# Patient Record
Sex: Female | Born: 1980 | Race: White | Hispanic: No | Marital: Married | Smoking: Never smoker
Health system: Southern US, Community
[De-identification: ages and names within clinical notes are randomized; demographics above are authoritative.]

## PROBLEM LIST (undated history)

## (undated) DIAGNOSIS — D649 Anemia, unspecified: Secondary | ICD-10-CM

## (undated) DIAGNOSIS — Z87440 Personal history of urinary (tract) infections: Secondary | ICD-10-CM

## (undated) DIAGNOSIS — K219 Gastro-esophageal reflux disease without esophagitis: Secondary | ICD-10-CM

## (undated) DIAGNOSIS — F419 Anxiety disorder, unspecified: Secondary | ICD-10-CM

## (undated) DIAGNOSIS — J45909 Unspecified asthma, uncomplicated: Secondary | ICD-10-CM

## (undated) DIAGNOSIS — Z8619 Personal history of other infectious and parasitic diseases: Secondary | ICD-10-CM

## (undated) DIAGNOSIS — T7840XA Allergy, unspecified, initial encounter: Secondary | ICD-10-CM

## (undated) HISTORY — DX: Personal history of urinary (tract) infections: Z87.440

## (undated) HISTORY — DX: Allergy, unspecified, initial encounter: T78.40XA

## (undated) HISTORY — PX: WISDOM TOOTH EXTRACTION: SHX21

## (undated) HISTORY — DX: Personal history of other infectious and parasitic diseases: Z86.19

## (undated) HISTORY — DX: Anxiety disorder, unspecified: F41.9

## (undated) HISTORY — PX: COSMETIC SURGERY: SHX468

---

## 2009-07-15 ENCOUNTER — Inpatient Hospital Stay: Payer: Self-pay

## 2014-06-26 ENCOUNTER — Ambulatory Visit (INDEPENDENT_AMBULATORY_CARE_PROVIDER_SITE_OTHER)
Admission: RE | Admit: 2014-06-26 | Discharge: 2014-06-26 | Disposition: A | Discharge status: Home / Self Care | Payer: BC Managed Care – PPO | Source: Ambulatory Visit | Attending: Internal Medicine | Admitting: Internal Medicine

## 2014-06-26 ENCOUNTER — Ambulatory Visit (INDEPENDENT_AMBULATORY_CARE_PROVIDER_SITE_OTHER): Payer: BC Managed Care – PPO | Admitting: Internal Medicine

## 2014-06-26 ENCOUNTER — Encounter: Payer: Self-pay | Admitting: Internal Medicine

## 2014-06-26 ENCOUNTER — Telehealth: Payer: Self-pay | Admitting: Internal Medicine

## 2014-06-26 VITALS — BP 110/80 | HR 89 | Temp 98.7°F | Ht 64.0 in | Wt 175.8 lb

## 2014-06-26 DIAGNOSIS — Z733 Stress, not elsewhere classified: Secondary | ICD-10-CM

## 2014-06-26 DIAGNOSIS — M7989 Other specified soft tissue disorders: Secondary | ICD-10-CM

## 2014-06-26 DIAGNOSIS — N939 Abnormal uterine and vaginal bleeding, unspecified: Secondary | ICD-10-CM

## 2014-06-26 DIAGNOSIS — Z13220 Encounter for screening for lipoid disorders: Secondary | ICD-10-CM

## 2014-06-26 DIAGNOSIS — M7918 Myalgia, other site: Secondary | ICD-10-CM

## 2014-06-26 DIAGNOSIS — F439 Reaction to severe stress, unspecified: Secondary | ICD-10-CM

## 2014-06-26 DIAGNOSIS — N926 Irregular menstruation, unspecified: Secondary | ICD-10-CM

## 2014-06-26 DIAGNOSIS — M799 Soft tissue disorder, unspecified: Secondary | ICD-10-CM

## 2014-06-26 DIAGNOSIS — Z309 Encounter for contraceptive management, unspecified: Secondary | ICD-10-CM

## 2014-06-26 DIAGNOSIS — M25539 Pain in unspecified wrist: Secondary | ICD-10-CM

## 2014-06-26 DIAGNOSIS — M25532 Pain in left wrist: Secondary | ICD-10-CM

## 2014-06-26 DIAGNOSIS — Z789 Other specified health status: Secondary | ICD-10-CM

## 2014-06-26 DIAGNOSIS — IMO0001 Reserved for inherently not codable concepts without codable children: Secondary | ICD-10-CM

## 2014-06-26 LAB — POCT URINE PREGNANCY: Preg Test, Ur: NEGATIVE

## 2014-06-26 MED ORDER — SERTRALINE HCL 50 MG PO TABS: 50.0000 mg | ORAL_TABLET | Freq: Every day | ORAL | Status: DC

## 2014-06-26 NOTE — Progress Notes (Signed)
Pre visit review using our clinic review tool, if applicable. No additional management support is needed unless otherwise documented below in the visit note. 

## 2014-06-26 NOTE — Telephone Encounter (Signed)
[facility] needs the location of where pt had US done. Please call [place] [phone]

## 2014-06-27 ENCOUNTER — Encounter: Payer: Self-pay | Admitting: *Deleted

## 2014-06-27 ENCOUNTER — Encounter: Payer: Self-pay | Admitting: Internal Medicine

## 2014-06-27 DIAGNOSIS — M7918 Myalgia, other site: Secondary | ICD-10-CM | POA: Insufficient documentation

## 2014-06-27 DIAGNOSIS — F439 Reaction to severe stress, unspecified: Secondary | ICD-10-CM | POA: Insufficient documentation

## 2014-06-27 DIAGNOSIS — Z789 Other specified health status: Secondary | ICD-10-CM | POA: Insufficient documentation

## 2014-06-27 NOTE — Progress Notes (Signed)
   Subjective:    Patient ID: Tara Davila, female    DOB: [birth year], 33 y.o.   MRN: [id number]  HPI 33 year old female who presents to establish care here at [facility].  Former pt of mine at [facility].  States she has noticed a "bump" on her buttock.  States has been present for two years.  Evaluated previously at [facility].  Ultrasound ordered.  States was negative.  Told she had adhesions.  Was referred to a chiropractor.  Did not help. Went to a few sessions.  Reports increased discomfort with sitting.  Her job consists mostly of sitting.  She has recently changed to a standing work station - secondary to the increased pain with sitting.  No pain with walking.  Overdue a physical.  She does report increased stress.  Has worsened recently.  Work stress.  Situation is not going to change anytime soon.  Feels she needs something more to help level things off.  No significant depression.  Eating and drinking well.  She also reports left wrist pain.  Is s/p a fall and has noticed persistent pain - left wrist and forearm.  Hurts more when she rotates her forearm medially.  Still with some swelling, but better.  She has a Mirena IUD.  Is due to be changed in 11/15.  Had placed at [facility].     Past Medical History  Diagnosis Date  . History of chicken pox   . Allergy   . Hx: UTI (urinary tract infection)     Review of Systems Patient denies any headache, lightheadedness or dizziness.  No sinus or allergy symptoms.  No chest pain, tightness or palpitations.  No increased shortness of breath, cough or congestion.  No nausea or vomiting.  No acid reflux.  No abdominal pain or cramping.  No bowel change, such as diarrhea, constipation, BRBPR or melana.  No urine change.  Does describe the "lump" on her buttock.  Persistent.  Increased pain with sitting.  W/up previously as outlined.  Increased stress as outlined.  Feels needs something to help level things out.  Persistent left wrist pain  s/p fall.  Had mirena IUD.       Objective:   Physical Exam Filed Vitals:   06/26/14 1334  BP: 110/80  Pulse: 89  Temp: 98.7 F (22.59 C)   33 year old female in no acute distress.   HEENT:  Nares- clear.  Oropharynx - without lesions. NECK:  Supple.  Nontender.  No audible bruit.  HEART:  Appears to be regular. LUNGS:  No crackles or wheezing audible.  Respirations even and unlabored.  RADIAL PULSE:  Equal bilaterally.   ABDOMEN:  Soft, nontender.  Bowel sounds present and normal.  No audible abdominal bruit.   EXTREMITIES:  No increased edema present.  DP pulses palpable and equal bilaterally.    MSK:  increased pain to palpation over the left wrist and forearm.  Minimal increased soft tissue swelling.  No increased erythema.    Increased palpable mass - right buttock.  Minimal increased tenderness with deep palpation.  No increased erythema.      Assessment & Plan:  HEALTH MAINTENANCE.  Schedule her for a physical.  Obtain records from [facility].    I spent more than 40 minutes with the patient and more than 50% of the time was spent in consultation regarding the above.

## 2014-06-27 NOTE — Assessment & Plan Note (Signed)
Increased stress as outlined.  Discussed at length with her today.  Will start zoloft as directed.  Discussed counseling.  She will notify me if agreeable.  Follow.  Get her back in soon to reassess.

## 2014-06-27 NOTE — Assessment & Plan Note (Signed)
S/p fall.  Persistent pain and swelling.  Check xray.  Wrist splint.  Further w/up and treatment pending.

## 2014-06-27 NOTE — Assessment & Plan Note (Signed)
Has mirena IUD.  Placed at [facility].  Due change 11/15.

## 2014-06-27 NOTE — Assessment & Plan Note (Signed)
Increased soft tissue mass - right buttock.  Exam as outlined.  Increased pain with sitting.  Previous ultrasound negative.  Was told had adhesions.  Pain affecting her work as outlined.  Will refer to surgery for further evaluation.

## 2014-07-02 NOTE — Telephone Encounter (Signed)
Unread mychart message mailed to patient 

## 2014-07-10 ENCOUNTER — Ambulatory Visit: Payer: Self-pay | Admitting: General Surgery

## 2014-07-26 ENCOUNTER — Encounter: Payer: Self-pay | Admitting: General Surgery

## 2014-07-26 ENCOUNTER — Ambulatory Visit (INDEPENDENT_AMBULATORY_CARE_PROVIDER_SITE_OTHER): Payer: BC Managed Care – PPO | Admitting: General Surgery

## 2014-07-26 VITALS — BP 118/70 | HR 76 | Resp 12 | Ht 64.0 in | Wt 175.0 lb

## 2014-07-26 DIAGNOSIS — M7989 Other specified soft tissue disorders: Secondary | ICD-10-CM

## 2014-07-26 DIAGNOSIS — M799 Soft tissue disorder, unspecified: Secondary | ICD-10-CM

## 2014-07-26 NOTE — Patient Instructions (Signed)
The patient is aware to call back for any questions or concerns.  

## 2014-07-26 NOTE — Progress Notes (Signed)
Patient ID: Tara Davila, female   DOB: 1981-10-12, 33 y.o.   MRN: [id number]  Chief Complaint  Patient presents with  . Mass    right buttock    HPI Tara Davila is a 33 y.o. female.  here today for evaluation of a soft tissue mass right upper portion of the buttocks. States it has been there for about 2 years. She does not feel it is getting any larger. States it is "sore" when she sits for along time. She has seen someone in [place] who told her it was an "adhesion". She did go to a chiropractor "active relief therapy" and that did not help.  Denies any injury to that area.  An ultrasound was done 09-09-12.   HPI  Past Medical History  Diagnosis Date  . History of chicken pox   . Allergy   . Hx: UTI (urinary tract infection)     Past Surgical History  Procedure Laterality Date  . Wisdom tooth extraction      Family History  Problem Relation Age of Onset  . Hyperlipidemia Mother   . Hypertension Father   . Cancer Maternal Grandmother     breast  . Hyperlipidemia Maternal Grandfather   . Heart disease Maternal Grandfather   . Hypertension Maternal Grandfather   . Stroke Paternal Grandmother     Social History History  Substance Use Topics  . Smoking status: Never Smoker   . Smokeless tobacco: Never Used  . Alcohol Use: Yes    No Known Allergies  Current Outpatient Prescriptions  Medication Sig Dispense Refill  . sertraline (ZOLOFT) 50 MG tablet Take 1 tablet (50 mg total) by mouth daily.  30 tablet  2   No current facility-administered medications for this visit.    Review of Systems Review of Systems  Constitutional: Negative.   Respiratory: Negative.   Cardiovascular: Negative.     Blood pressure 118/70, pulse 76, resp. rate 12, height 5\' 4"  (1.626 m), weight 175 lb (79.379 kg).  Physical Exam Physical Exam  Constitutional: She is oriented to person, place, and time. She appears well-developed and well-nourished.  Neck: Neck supple.   Cardiovascular: Normal rate, regular rhythm and normal heart sounds.   Pulmonary/Chest: Effort normal and breath sounds normal.  Musculoskeletal:       Legs: Lymphadenopathy:    She has no cervical adenopathy.  Neurological: She is alert and oriented to person, place, and time.  Skin: Skin is warm and dry.  2 x 4 cm firm thickening right gluteal area    Data Reviewed Ultrasound of the buttocks completed September 09, 2012 at [facility] showed no discernible abnormality.  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     Plan    Options were reviewed: 1) observation versus 2) excision. The patient is not appreciated a significant change in at least the last 12 months. It's nontender except for firm direct pressure, which rarely occurs. My recommendation would be for observation. If she desires excision this can be completed under local anesthesia, but will leave a scar which may be tender. She will notify the office if the area changes, follow up otherwise will be on an as needed basis.      PCP/Ref: Nash Shearer 07/27/2014, 7:20 PM

## 2014-07-27 DIAGNOSIS — M7989 Other specified soft tissue disorders: Secondary | ICD-10-CM | POA: Insufficient documentation

## 2014-09-24 ENCOUNTER — Encounter: Payer: Self-pay | Admitting: General Surgery

## 2015-01-30 ENCOUNTER — Other Ambulatory Visit: Payer: Self-pay | Admitting: Internal Medicine

## 2015-03-01 ENCOUNTER — Other Ambulatory Visit: Payer: Self-pay | Admitting: Internal Medicine

## 2015-03-28 ENCOUNTER — Ambulatory Visit (INDEPENDENT_AMBULATORY_CARE_PROVIDER_SITE_OTHER): Payer: BC Managed Care – PPO | Admitting: Internal Medicine

## 2015-03-28 ENCOUNTER — Encounter: Payer: Self-pay | Admitting: Internal Medicine

## 2015-03-28 VITALS — BP 100/70 | HR 84 | Temp 98.4°F | Ht 64.0 in | Wt 180.0 lb

## 2015-03-28 DIAGNOSIS — Z309 Encounter for contraceptive management, unspecified: Secondary | ICD-10-CM

## 2015-03-28 DIAGNOSIS — Z789 Other specified health status: Secondary | ICD-10-CM

## 2015-03-28 DIAGNOSIS — M799 Soft tissue disorder, unspecified: Secondary | ICD-10-CM

## 2015-03-28 DIAGNOSIS — Z0000 Encounter for general adult medical examination without abnormal findings: Secondary | ICD-10-CM | POA: Diagnosis not present

## 2015-03-28 DIAGNOSIS — M7989 Other specified soft tissue disorders: Secondary | ICD-10-CM

## 2015-03-28 DIAGNOSIS — Z658 Other specified problems related to psychosocial circumstances: Secondary | ICD-10-CM

## 2015-03-28 DIAGNOSIS — F439 Reaction to severe stress, unspecified: Secondary | ICD-10-CM

## 2015-03-28 MED ORDER — SERTRALINE HCL 50 MG PO TABS: 50.0000 mg | ORAL_TABLET | Freq: Every day | ORAL | Status: DC

## 2015-03-28 NOTE — Progress Notes (Signed)
Patient ID: Tara Davila, female   DOB: 03-08-1981, 34 y.o.   MRN: [id number]   Subjective:    Patient ID: Tara Davila, female    DOB: 1981-11-16, 34 y.o.   MRN: [id number]  HPI  Patient here for a scheduled physical, but has had her breast, pelvic and pap smear from gyn.  Here to f/u on her current medical issues.  She saw Dr Bary Castilla.  Diagnosed with gluteal lipoma.  Observation.  Off zoloft for one month.  Increased stress and anxiety.  Increased stress at work.  Feels like she is going to explode at times.  Feels she needs to be back on zoloft.     Past Medical History  Diagnosis Date  . History of chicken pox   . Allergy   . Hx: UTI (urinary tract infection)     Outpatient Encounter Prescriptions as of 03/28/2015  Medication Sig  . [DISCONTINUED] sertraline (ZOLOFT) 50 MG tablet TAKE 1 TABLET (50 MG TOTAL) BY MOUTH DAILY.  Marland Kitchen sertraline (ZOLOFT) 50 MG tablet Take 1 tablet (50 mg total) by mouth daily.   No facility-administered encounter medications on file as of 03/28/2015.    Review of Systems  Constitutional: Positive for appetite change (feels - stays hungry. ). Negative for unexpected weight change.  HENT: Negative for congestion and sinus pressure.   Eyes: Negative for pain and visual disturbance.  Respiratory: Negative for cough, chest tightness and shortness of breath.   Cardiovascular: Negative for palpitations and leg swelling.  Gastrointestinal: Negative for nausea, vomiting, abdominal pain and diarrhea.  Genitourinary: Negative for dysuria and difficulty urinating.  Musculoskeletal: Negative for back pain and joint swelling.  Skin: Negative for color change and rash.  Neurological: Negative for dizziness, light-headedness and headaches.  Hematological: Negative for adenopathy. Does not bruise/bleed easily.  Psychiatric/Behavioral: Negative for dysphoric mood and agitation.       Objective:     Pulse 76  Physical Exam  Constitutional: She appears  well-developed and well-nourished. No distress.  HENT:  Nose: Nose normal.  Mouth/Throat: Oropharynx is clear and moist.  Neck: Neck supple. No thyromegaly present.  Cardiovascular: Normal rate and regular rhythm.   Pulmonary/Chest: Breath sounds normal. No respiratory distress. She has no wheezes.  Abdominal: Soft. Bowel sounds are normal. There is no tenderness.  Musculoskeletal: She exhibits no edema or tenderness.  Lymphadenopathy:    She has no cervical adenopathy.  Skin: No rash noted. No erythema.  Psychiatric: Her behavior is normal.    BP 100/70 mmHg  Pulse 84  Temp(Src) 98.4 F (36.9 C) (Oral)  Ht 5\' 4"  (1.626 m)  Wt 180 lb (81.647 kg)  BMI 30.88 kg/m2  SpO2 97% Wt Readings from Last 3 Encounters:  03/28/15 180 lb (81.647 kg)  07/26/14 175 lb (79.379 kg)  06/26/14 175 lb 12 oz (79.72 kg)       Assessment & Plan:   Problem List Items Addressed This Visit    Health care maintenance    GYN does her breast, pelvic and pap smears.  Obtain records.  States up to date.       Soft tissue mass - Primary    Right gluteal lipoma.  Saw Dr Bary Castilla.  Observation.       Stress    Symptoms as outlined.  Restart zoloft 50mg  q day.  She did feel it helped when she took the medication.  Restart.  Get her back in soon to reassess.  Uses birth control    Has IUD in place.          I spent 25 minutes with the patient and more than 50% of the time was spent in consultation regarding the above.     Einar Pheasant, MD

## 2015-03-28 NOTE — Progress Notes (Signed)
Pre visit review using our clinic review tool, if applicable. No additional management support is needed unless otherwise documented below in the visit note. 

## 2015-03-31 ENCOUNTER — Encounter: Payer: Self-pay | Admitting: Internal Medicine

## 2015-03-31 DIAGNOSIS — Z0000 Encounter for general adult medical examination without abnormal findings: Secondary | ICD-10-CM | POA: Insufficient documentation

## 2015-03-31 NOTE — Assessment & Plan Note (Signed)
Has IUD in place.

## 2015-03-31 NOTE — Assessment & Plan Note (Signed)
Right gluteal lipoma.  Saw Dr Bary Castilla.  Observation.

## 2015-03-31 NOTE — Assessment & Plan Note (Signed)
GYN does her breast, pelvic and pap smears.  Obtain records.  States up to date.

## 2015-03-31 NOTE — Assessment & Plan Note (Signed)
Symptoms as outlined.  Restart zoloft 50mg  q day.  She did feel it helped when she took the medication.  Restart.  Get her back in soon to reassess.

## 2015-06-27 ENCOUNTER — Encounter: Payer: Self-pay | Admitting: Internal Medicine

## 2015-06-27 ENCOUNTER — Ambulatory Visit (INDEPENDENT_AMBULATORY_CARE_PROVIDER_SITE_OTHER): Payer: BC Managed Care – PPO | Admitting: Internal Medicine

## 2015-06-27 VITALS — BP 100/70 | HR 81 | Temp 99.0°F | Ht 64.0 in | Wt 161.4 lb

## 2015-06-27 DIAGNOSIS — Z658 Other specified problems related to psychosocial circumstances: Secondary | ICD-10-CM | POA: Diagnosis not present

## 2015-06-27 DIAGNOSIS — Z0000 Encounter for general adult medical examination without abnormal findings: Secondary | ICD-10-CM

## 2015-06-27 DIAGNOSIS — F439 Reaction to severe stress, unspecified: Secondary | ICD-10-CM

## 2015-06-27 MED ORDER — SERTRALINE HCL 50 MG PO TABS: 50.0000 mg | ORAL_TABLET | Freq: Every day | ORAL | Status: DC

## 2015-06-27 NOTE — Assessment & Plan Note (Signed)
Doing well on zoloft.  Doing better.  Continue current dose.

## 2015-06-27 NOTE — Progress Notes (Signed)
Patient ID: Tara Davila, female   DOB: [birth year], 34 y.o.   MRN: [id number]   Subjective:    Patient ID: Tara Davila, female    DOB: 1981-05-01, 34 y.o.   MRN: [id number]  HPI  Patient here for a scheduled follow up.  Has adjusted her diet.  Lost weight.  She is maintaining her weight loss.  Is exercising.  Plans to exercise more.  No cardiac symptoms with increased activity or exertion.  No sob.  Feels better.  zoloft working well.     Past Medical History  Diagnosis Date  . History of chicken pox   . Allergy   . Hx: UTI (urinary tract infection)     Outpatient Encounter Prescriptions as of 06/27/2015  Medication Sig  . sertraline (ZOLOFT) 50 MG tablet Take 1 tablet (50 mg total) by mouth daily.  . [DISCONTINUED] sertraline (ZOLOFT) 50 MG tablet Take 1 tablet (50 mg total) by mouth daily.   No facility-administered encounter medications on file as of 06/27/2015.    Review of Systems  Constitutional: Negative for appetite change and unexpected weight change.  HENT: Negative for congestion and sinus pressure.   Respiratory: Negative for cough, chest tightness and shortness of breath.   Cardiovascular: Negative for chest pain, palpitations and leg swelling.  Gastrointestinal: Negative for nausea, vomiting, abdominal pain and diarrhea.  Neurological: Negative for dizziness, light-headedness and headaches.  Psychiatric/Behavioral: Negative for dysphoric mood and agitation.       Objective:   blood pressure recheck:  104/68  Physical Exam  Constitutional: She appears well-developed and well-nourished. No distress.  HENT:  Nose: Nose normal.  Mouth/Throat: Oropharynx is clear and moist.  Neck: Neck supple. No thyromegaly present.  Cardiovascular: Normal rate and regular rhythm.   Pulmonary/Chest: Breath sounds normal. No respiratory distress. She has no wheezes.  Abdominal: Soft. Bowel sounds are normal. There is no tenderness.  Musculoskeletal: She exhibits no  edema or tenderness.  Lymphadenopathy:    She has no cervical adenopathy.  Skin: No rash noted. No erythema.  Psychiatric: She has a normal mood and affect. Her behavior is normal.    BP 100/70 mmHg  Pulse 81  Temp(Src) 99 F (37.2 C) (Oral)  Ht 5\' 4"  (1.626 m)  Wt 161 lb 6 oz (73.199 kg)  BMI 27.69 kg/m2  SpO2 99% Wt Readings from Last 3 Encounters:  06/27/15 161 lb 6 oz (73.199 kg)  03/28/15 180 lb (81.647 kg)  07/26/14 175 lb (79.379 kg)        Assessment & Plan:   Problem List Items Addressed This Visit    Health care maintenance - Primary    GYN does her breast, pelvic and pap smears.  Up to date.       Stress    Doing well on zoloft.  Doing better.  Continue current dose.          Desire for weight loss.  Has done well.  Continue diet and exercise.    Tara Pheasant, MD

## 2015-06-27 NOTE — Assessment & Plan Note (Signed)
GYN does her breast, pelvic and pap smears.  Up to date.

## 2015-06-27 NOTE — Progress Notes (Signed)
Pre visit review using our clinic review tool, if applicable. No additional management support is needed unless otherwise documented below in the visit note. 

## 2015-09-27 ENCOUNTER — Ambulatory Visit (INDEPENDENT_AMBULATORY_CARE_PROVIDER_SITE_OTHER): Payer: BC Managed Care – PPO | Admitting: Internal Medicine

## 2015-09-27 ENCOUNTER — Encounter: Payer: Self-pay | Admitting: Internal Medicine

## 2015-09-27 VITALS — BP 100/80 | HR 74 | Temp 98.6°F | Resp 18 | Ht 64.0 in | Wt 160.0 lb

## 2015-09-27 DIAGNOSIS — Z658 Other specified problems related to psychosocial circumstances: Secondary | ICD-10-CM | POA: Diagnosis not present

## 2015-09-27 DIAGNOSIS — Z789 Other specified health status: Secondary | ICD-10-CM

## 2015-09-27 DIAGNOSIS — Z23 Encounter for immunization: Secondary | ICD-10-CM

## 2015-09-27 DIAGNOSIS — F439 Reaction to severe stress, unspecified: Secondary | ICD-10-CM

## 2015-09-27 DIAGNOSIS — Z13220 Encounter for screening for lipoid disorders: Secondary | ICD-10-CM | POA: Diagnosis not present

## 2015-09-27 DIAGNOSIS — Z309 Encounter for contraceptive management, unspecified: Secondary | ICD-10-CM | POA: Diagnosis not present

## 2015-09-27 NOTE — Progress Notes (Signed)
Pre-visit discussion using our clinic review tool. No additional management support is needed unless otherwise documented below in the visit note.  

## 2015-09-27 NOTE — Progress Notes (Signed)
Patient ID: Tara Davila, female   DOB: Dec 23, 1980, 34 y.o.   MRN: [id number]   Subjective:    Patient ID: Tara Davila, female    DOB: 1981-11-15, 34 y.o.   MRN: [id number]  HPI  Patient with past history of allergies and increased stress. She comes in today for a scheduled follow up of these issues.  On zoloft.  Doing well.  Feels better.  Has been able to maintain her weight.  Is walking.  Has adjusted her diet.  No cardiac symptoms with increased activity or exertion.  No sob.  No abdominal pain or cramping.  Bowels stable.     Past Medical History  Diagnosis Date  . History of chicken pox   . Allergy   . Hx: UTI (urinary tract infection)    Past Surgical History  Procedure Laterality Date  . Wisdom tooth extraction     Family History  Problem Relation Age of Onset  . Hyperlipidemia Mother   . Hypertension Father   . Cancer Maternal Grandmother     breast  . Hyperlipidemia Maternal Grandfather   . Heart disease Maternal Grandfather   . Hypertension Maternal Grandfather   . Stroke Paternal Grandmother    Social History   Social History  . Marital Status: Married    Spouse Name: N/A  . Number of Children: N/A  . Years of Education: N/A   Social History Main Topics  . Smoking status: Never Smoker   . Smokeless tobacco: Never Used  . Alcohol Use: 0.0 oz/week    0 Standard drinks or equivalent per week  . Drug Use: No  . Sexual Activity: Not Asked   Other Topics Concern  . None   Social History Narrative    Outpatient Encounter Prescriptions as of 09/27/2015  Medication Sig  . sertraline (ZOLOFT) 50 MG tablet Take 1 tablet (50 mg total) by mouth daily.   No facility-administered encounter medications on file as of 09/27/2015.    Review of Systems  Constitutional: Negative for appetite change and unexpected weight change.  HENT: Negative for congestion and sinus pressure.   Respiratory: Negative for cough, chest tightness and shortness of breath.    Cardiovascular: Negative for chest pain, palpitations and leg swelling.  Gastrointestinal: Negative for nausea, vomiting, abdominal pain and diarrhea.  Genitourinary: Negative for dysuria and difficulty urinating.  Musculoskeletal: Negative for back pain and joint swelling.  Skin: Negative for color change and rash.  Neurological: Negative for dizziness, light-headedness and headaches.  Psychiatric/Behavioral: Negative for dysphoric mood and agitation.       Objective:    Physical Exam  Constitutional: She appears well-developed and well-nourished. No distress.  HENT:  Nose: Nose normal.  Mouth/Throat: Oropharynx is clear and moist.  Eyes: Conjunctivae are normal. Right eye exhibits no discharge. Left eye exhibits no discharge.  Neck: Neck supple. No thyromegaly present.  Cardiovascular: Normal rate and regular rhythm.   Pulmonary/Chest: Breath sounds normal. No respiratory distress. She has no wheezes.  Abdominal: Soft. Bowel sounds are normal. There is no tenderness.  Musculoskeletal: She exhibits no edema or tenderness.  Lymphadenopathy:    She has no cervical adenopathy.  Skin: No rash noted. No erythema.  Psychiatric: She has a normal mood and affect. Her behavior is normal.    BP 100/80 mmHg  Pulse 74  Temp(Src) 98.6 F (37 C) (Oral)  Resp 18  Ht 5\' 4"  (1.626 m)  Wt 160 lb (72.576 kg)  BMI 27.45 kg/m2  SpO2 96%  Wt Readings from Last 3 Encounters:  09/27/15 160 lb (72.576 kg)  06/27/15 161 lb 6 oz (73.199 kg)  03/28/15 180 lb (81.647 kg)        Assessment & Plan:   Problem List Items Addressed This Visit    Stress    Doing better handling stress.  On zoloft and doing well.  Continue.  Follow.        Relevant Orders   CBC with Differential/Platelet   Comprehensive metabolic panel   TSH   Uses birth control    Has IUD in place.  Follow.         Other Visit Diagnoses    Encounter for immunization    -  Primary    Screening cholesterol level         Relevant Orders    Lipid panel        Einar Pheasant, MD

## 2015-09-27 NOTE — Patient Instructions (Signed)

## 2015-09-29 ENCOUNTER — Encounter: Payer: Self-pay | Admitting: Internal Medicine

## 2015-09-29 NOTE — Assessment & Plan Note (Signed)
Has IUD in place.  Follow.

## 2015-09-29 NOTE — Assessment & Plan Note (Signed)
Doing better handling stress.  On zoloft and doing well.  Continue.  Follow.

## 2015-11-06 ENCOUNTER — Other Ambulatory Visit: Payer: BC Managed Care – PPO

## 2015-11-11 ENCOUNTER — Other Ambulatory Visit (INDEPENDENT_AMBULATORY_CARE_PROVIDER_SITE_OTHER): Payer: BC Managed Care – PPO

## 2015-11-11 DIAGNOSIS — Z13220 Encounter for screening for lipoid disorders: Secondary | ICD-10-CM | POA: Diagnosis not present

## 2015-11-11 DIAGNOSIS — F439 Reaction to severe stress, unspecified: Secondary | ICD-10-CM

## 2015-11-11 DIAGNOSIS — Z658 Other specified problems related to psychosocial circumstances: Secondary | ICD-10-CM

## 2015-11-11 LAB — CBC WITH DIFFERENTIAL/PLATELET
Basophils Absolute: 0 10*3/uL (ref 0.0–0.1)
Basophils Relative: 0.4 % (ref 0.0–3.0)
EOS PCT: 1.2 % (ref 0.0–5.0)
Eosinophils Absolute: 0.1 10*3/uL (ref 0.0–0.7)
HCT: 40.3 % (ref 36.0–46.0)
Hemoglobin: 13.4 g/dL (ref 12.0–15.0)
LYMPHS ABS: 2.7 10*3/uL (ref 0.7–4.0)
Lymphocytes Relative: 28.9 % (ref 12.0–46.0)
MCHC: 33.2 g/dL (ref 30.0–36.0)
MCV: 90.3 fl (ref 78.0–100.0)
MONO ABS: 0.6 10*3/uL (ref 0.1–1.0)
Monocytes Relative: 6.6 % (ref 3.0–12.0)
NEUTROS PCT: 62.9 % (ref 43.0–77.0)
Neutro Abs: 5.8 10*3/uL (ref 1.4–7.7)
Platelets: 205 10*3/uL (ref 150.0–400.0)
RBC: 4.46 Mil/uL (ref 3.87–5.11)
RDW: 13.6 % (ref 11.5–15.5)
WBC: 9.3 10*3/uL (ref 4.0–10.5)

## 2015-11-11 LAB — LIPID PANEL
CHOLESTEROL: 127 mg/dL (ref 0–200)
HDL: 46.9 mg/dL (ref >39.00)
LDL CALC: 57 mg/dL (ref 0–99)
NonHDL: 80.55
Total CHOL/HDL Ratio: 3
Triglycerides: 118 mg/dL (ref 0.0–149.0)
VLDL: 23.6 mg/dL (ref 0.0–40.0)

## 2015-11-11 LAB — COMPREHENSIVE METABOLIC PANEL
ALK PHOS: 56 U/L (ref 39–117)
ALT: 15 U/L (ref 0–35)
AST: 15 U/L (ref 0–37)
Albumin: 4.3 g/dL (ref 3.5–5.2)
BUN: 14 mg/dL (ref 6–23)
CO2: 25 meq/L (ref 19–32)
Calcium: 9.4 mg/dL (ref 8.4–10.5)
Chloride: 103 mEq/L (ref 96–112)
Creatinine, Ser: 0.66 mg/dL (ref 0.40–1.20)
GFR: 108.24 mL/min (ref >60.00)
GLUCOSE: 84 mg/dL (ref 70–99)
POTASSIUM: 3.5 meq/L (ref 3.5–5.1)
SODIUM: 136 meq/L (ref 135–145)
TOTAL PROTEIN: 7.3 g/dL (ref 6.0–8.3)
Total Bilirubin: 0.4 mg/dL (ref 0.2–1.2)

## 2015-11-11 LAB — TSH: TSH: 2.27 u[IU]/mL (ref 0.35–4.50)

## 2015-11-12 ENCOUNTER — Encounter: Payer: Self-pay | Admitting: Internal Medicine

## 2016-01-30 ENCOUNTER — Ambulatory Visit: Payer: BC Managed Care – PPO | Admitting: Internal Medicine

## 2016-02-15 ENCOUNTER — Other Ambulatory Visit: Payer: Self-pay | Admitting: Internal Medicine

## 2016-03-21 ENCOUNTER — Other Ambulatory Visit: Payer: Self-pay | Admitting: Internal Medicine

## 2016-05-29 ENCOUNTER — Ambulatory Visit (INDEPENDENT_AMBULATORY_CARE_PROVIDER_SITE_OTHER): Payer: BC Managed Care – PPO | Admitting: Internal Medicine

## 2016-05-29 ENCOUNTER — Encounter: Payer: Self-pay | Admitting: Internal Medicine

## 2016-05-29 VITALS — BP 100/70 | HR 79 | Temp 98.4°F | Resp 18 | Ht 64.0 in | Wt 166.2 lb

## 2016-05-29 DIAGNOSIS — F439 Reaction to severe stress, unspecified: Secondary | ICD-10-CM

## 2016-05-29 DIAGNOSIS — Z658 Other specified problems related to psychosocial circumstances: Secondary | ICD-10-CM | POA: Diagnosis not present

## 2016-05-29 DIAGNOSIS — Z309 Encounter for contraceptive management, unspecified: Secondary | ICD-10-CM | POA: Diagnosis not present

## 2016-05-29 DIAGNOSIS — Z789 Other specified health status: Secondary | ICD-10-CM

## 2016-05-29 DIAGNOSIS — G479 Sleep disorder, unspecified: Secondary | ICD-10-CM

## 2016-05-29 MED ORDER — SERTRALINE HCL 50 MG PO TABS: ORAL_TABLET | ORAL | Status: DC

## 2016-05-29 NOTE — Progress Notes (Signed)
Patient ID: Tara Davila, female   DOB: 1981-09-25, 35 y.o.   MRN: [id number]   Subjective:    Patient ID: Tara Davila, female    DOB: [birth year], 35 y.o.   MRN: [id number]  HPI  Patient here for a scheduled follow up.  States she has been under increased stress recently.  Increased stress at work.  Also increased stress with her sons medical issues.  She reports some increased anxiety.  Does feel zoloft has helped.  Not sleeping well.  Mind not shutting down.  Eating and drinking.  Has gained some of her weight back.  Still exercising.  No cardiac symptoms with increased activity or exertion.  No sob.  No abdominal pain or cramping.  Bowels stable.     Past Medical History  Diagnosis Date  . History of chicken pox   . Allergy   . Hx: UTI (urinary tract infection)    Past Surgical History  Procedure Laterality Date  . Wisdom tooth extraction     Family History  Problem Relation Age of Onset  . Hyperlipidemia Mother   . Hypertension Father   . Cancer Maternal Grandmother     breast  . Hyperlipidemia Maternal Grandfather   . Heart disease Maternal Grandfather   . Hypertension Maternal Grandfather   . Stroke Paternal Grandmother    Social History   Social History  . Marital Status: Married    Spouse Name: N/A  . Number of Children: N/A  . Years of Education: N/A   Social History Main Topics  . Smoking status: Never Smoker   . Smokeless tobacco: Never Used  . Alcohol Use: 0.0 oz/week    0 Standard drinks or equivalent per week  . Drug Use: No  . Sexual Activity: Not Asked   Other Topics Concern  . None   Social History Narrative    Outpatient Encounter Prescriptions as of 05/29/2016  Medication Sig  . sertraline (ZOLOFT) 50 MG tablet Take 1 1/2 tablet q day  . [DISCONTINUED] sertraline (ZOLOFT) 50 MG tablet TAKE 1 TABLET (50 MG TOTAL) BY MOUTH DAILY.   No facility-administered encounter medications on file as of 05/29/2016.    Review of Systems    Constitutional: Negative for appetite change and unexpected weight change.  HENT: Negative for congestion and sinus pressure.   Respiratory: Negative for cough, chest tightness and shortness of breath.   Cardiovascular: Negative for chest pain, palpitations and leg swelling.  Gastrointestinal: Negative for nausea, vomiting, abdominal pain and diarrhea.  Genitourinary: Negative for dysuria and difficulty urinating.  Musculoskeletal: Negative for back pain and joint swelling.  Skin: Negative for color change and rash.  Neurological: Negative for dizziness, light-headedness and headaches.  Psychiatric/Behavioral: Negative for dysphoric mood and agitation.       Objective:    Physical Exam  Constitutional: She appears well-developed and well-nourished. No distress.  HENT:  Nose: Nose normal.  Mouth/Throat: Oropharynx is clear and moist.  Neck: Neck supple. No thyromegaly present.  Cardiovascular: Normal rate and regular rhythm.   Pulmonary/Chest: Breath sounds normal. No respiratory distress. She has no wheezes.  Abdominal: Soft. Bowel sounds are normal. There is no tenderness.  Musculoskeletal: She exhibits no edema or tenderness.  Lymphadenopathy:    She has no cervical adenopathy.  Skin: No rash noted. No erythema.  Psychiatric: She has a normal mood and affect. Her behavior is normal.    BP 100/70 mmHg  Pulse 79  Temp(Src) 98.4 F (36.9 C) (Oral)  Resp  18  Ht 5\' 4"  (1.626 m)  Wt 166 lb 4 oz (75.411 kg)  BMI 28.52 kg/m2  SpO2 98% Wt Readings from Last 3 Encounters:  05/29/16 166 lb 4 oz (75.411 kg)  09/27/15 160 lb (72.576 kg)  06/27/15 161 lb 6 oz (73.199 kg)     Lab Results  Component Value Date   WBC 9.3 11/11/2015   HGB 13.4 11/11/2015   HCT 40.3 11/11/2015   PLT 205.0 11/11/2015   GLUCOSE 84 11/11/2015   CHOL 127 11/11/2015   TRIG 118.0 11/11/2015   HDL 46.90 11/11/2015   LDLCALC 57 11/11/2015   ALT 15 11/11/2015   AST 15 11/11/2015   NA 136  11/11/2015   K 3.5 11/11/2015   CL 103 11/11/2015   CREATININE 0.66 11/11/2015   BUN 14 11/11/2015   CO2 25 11/11/2015   TSH 2.27 11/11/2015        Assessment & Plan:   Problem List Items Addressed This Visit    Difficulty sleeping    Discussed with her today.  Will increase zoloft to 75mg  q day.  See if this helps sleep.  Follow.        Stress - Primary    Discussed with her today.  Discussed counseling.  Discussed her current stress.  Will increased zoloft to 75mg  q day.  See if this helps sleeping.  Follow.  Get her back in soon to reassess.        Uses birth control    Has IUD in place.  Follow.          I spent 25 minutes with the patient and more than 50% of the time was spent in consultation regarding the above.     Einar Pheasant, MD

## 2016-05-29 NOTE — Progress Notes (Signed)
Pre-visit discussion using our clinic review tool. No additional management support is needed unless otherwise documented below in the visit note.  

## 2016-05-31 ENCOUNTER — Encounter: Payer: Self-pay | Admitting: Internal Medicine

## 2016-05-31 DIAGNOSIS — G479 Sleep disorder, unspecified: Secondary | ICD-10-CM | POA: Insufficient documentation

## 2016-05-31 NOTE — Assessment & Plan Note (Signed)
Has IUD in place.  Follow.

## 2016-05-31 NOTE — Assessment & Plan Note (Signed)
Discussed with her today.  Will increase zoloft to 75mg  q day.  See if this helps sleep.  Follow.

## 2016-05-31 NOTE — Assessment & Plan Note (Signed)
Discussed with her today.  Discussed counseling.  Discussed her current stress.  Will increased zoloft to 75mg  q day.  See if this helps sleeping.  Follow.  Get her back in soon to reassess.

## 2016-08-13 ENCOUNTER — Ambulatory Visit: Payer: BC Managed Care – PPO | Admitting: Internal Medicine

## 2017-03-13 ENCOUNTER — Other Ambulatory Visit: Payer: Self-pay | Admitting: Internal Medicine

## 2017-03-15 NOTE — Telephone Encounter (Signed)
Last OV and refill was 06/08/16, Please advise for refill, no upcoming appt.

## 2017-03-15 NOTE — Telephone Encounter (Signed)
Should already be out of medication,so need to know how she has been taking the medication.  Also confirm doing ok.  Will need f/u appt with me.  Just need a little more info to determine refill.  Thanks

## 2017-03-17 NOTE — Telephone Encounter (Signed)
Left a VM to return my call, thanks 

## 2019-08-02 ENCOUNTER — Telehealth: Payer: Self-pay | Admitting: Obstetrics & Gynecology

## 2019-08-02 NOTE — Telephone Encounter (Signed)
Patient scheduled 9/21 for mirena replacement with [facility]

## 2019-08-02 NOTE — Telephone Encounter (Signed)
Noted. Will order to arrive by apt date/time. 

## 2019-08-14 ENCOUNTER — Other Ambulatory Visit (HOSPITAL_COMMUNITY)
Admission: RE | Admit: 2019-08-14 | Discharge: 2019-08-14 | Disposition: A | Discharge status: Home / Self Care | Payer: BC Managed Care – PPO | Source: Ambulatory Visit | Attending: Obstetrics & Gynecology | Admitting: Obstetrics & Gynecology

## 2019-08-14 ENCOUNTER — Ambulatory Visit (INDEPENDENT_AMBULATORY_CARE_PROVIDER_SITE_OTHER): Payer: BC Managed Care – PPO | Admitting: Obstetrics & Gynecology

## 2019-08-14 ENCOUNTER — Encounter: Payer: Self-pay | Admitting: Obstetrics & Gynecology

## 2019-08-14 ENCOUNTER — Other Ambulatory Visit: Payer: Self-pay

## 2019-08-14 VITALS — BP 120/80 | Ht 65.0 in | Wt 167.0 lb

## 2019-08-14 DIAGNOSIS — Z01419 Encounter for gynecological examination (general) (routine) without abnormal findings: Secondary | ICD-10-CM

## 2019-08-14 DIAGNOSIS — Z124 Encounter for screening for malignant neoplasm of cervix: Secondary | ICD-10-CM | POA: Diagnosis not present

## 2019-08-14 DIAGNOSIS — Z30433 Encounter for removal and reinsertion of intrauterine contraceptive device: Secondary | ICD-10-CM

## 2019-08-14 NOTE — Patient Instructions (Signed)
Intrauterine Device Insertion, Care After  This sheet gives you information about how to care for yourself after your procedure. Your health care provider may also give you more specific instructions. If you have problems or questions, contact your health care provider. What can I expect after the procedure? After the procedure, it is common to have:  Cramps and pain in the abdomen.  Light bleeding (spotting) or heavier bleeding that is like your menstrual period. This may last for up to a few days.  Lower back pain.  Dizziness.  Headaches.  Nausea. Follow these instructions at home:  Before resuming sexual activity, check to make sure that you can feel the IUD string(s). You should be able to feel the end of the string(s) below the opening of your cervix. If your IUD string is in place, you may resume sexual activity. ? If you had a hormonal IUD inserted more than 7 days after your most recent period started, you will need to use a backup method of birth control for 7 days after IUD insertion. Ask your health care provider whether this applies to you.  Continue to check that the IUD is still in place by feeling for the string(s) after every menstrual period, or once a month.  Take over-the-counter and prescription medicines only as told by your health care provider.  Do not drive or use heavy machinery while taking prescription pain medicine.  Keep all follow-up visits as told by your health care provider. This is important. Contact a health care provider if:  You have bleeding that is heavier or lasts longer than a normal menstrual cycle.  You have a fever.  You have cramps or abdominal pain that get worse or do not get better with medicine.  You develop abdominal pain that is new or is not in the same area of earlier cramping and pain.  You feel lightheaded or weak.  You have abnormal or bad-smelling discharge from your vagina.  You have pain during sexual activity.   You have any of the following problems with your IUD string(s): ? The string bothers or hurts you or your sexual partner. ? You cannot feel the string. ? The string has gotten longer.  You can feel the IUD in your vagina.  You think you may be pregnant, or you miss your menstrual period.  You think you may have an STI (sexually transmitted infection). Get help right away if:  You have flu-like symptoms.  You have a fever and chills.  You can feel that your IUD has slipped out of place. Summary  After the procedure, it is common to have cramps and pain in the abdomen. It is also common to have light bleeding (spotting) or heavier bleeding that is like your menstrual period.  Continue to check that the IUD is still in place by feeling for the string(s) after every menstrual period, or once a month.  Keep all follow-up visits as told by your health care provider. This is important.  Contact your health care provider if you have problems with your IUD string(s), such as the string getting longer or bothering you or your sexual partner. This information is not intended to replace advice given to you by your health care provider. Make sure you discuss any questions you have with your health care provider. Document Released: 07/08/2011 Document Revised: 10/22/2017 Document Reviewed: 09/30/2016 Elsevier Patient Education  2020 Elsevier Inc.  

## 2019-08-14 NOTE — Progress Notes (Signed)
HPI:      Ms. RICKEYA HEMMERT is a 38 y.o. G2P0010 who LMP was No LMP recorded. Patient has had an implant., she presents today for her annual examination. The patient has no complaints today. The patient is sexually active. Her last pap: was normal. The patient does perform self breast exams.  There is notable family history of breast or ovarian cancer in her family.  The patient has regular exercise: yes.  The patient denies current symptoms of depression.    GYN History: Contraception: IUD, for 10 years (Mirena placed every 5)    Does not desire pregnancy again  PMHx: Past Medical History:  Diagnosis Date  . Allergy   . History of chicken pox   . Hx: UTI (urinary tract infection)    Past Surgical History:  Procedure Laterality Date  . WISDOM TOOTH EXTRACTION     Family History  Problem Relation Age of Onset  . Hyperlipidemia Mother   . Hypertension Father   . Cancer Maternal Grandmother        breast  . Hyperlipidemia Maternal Grandfather   . Heart disease Maternal Grandfather   . Hypertension Maternal Grandfather   . Stroke Paternal Grandmother    Social History   Tobacco Use  . Smoking status: Never Smoker  . Smokeless tobacco: Never Used  Substance Use Topics  . Alcohol use: Yes    Alcohol/week: 0.0 standard drinks  . Drug use: No    Current Outpatient Medications:  .  sertraline (ZOLOFT) 50 MG tablet, Take 1 1/2 tablet q day (Patient not taking: Reported on 08/14/2019), Disp: 45 tablet, Rfl: 3 Allergies: Patient has no known allergies.  Review of Systems  Constitutional: Negative for chills, fever and malaise/fatigue.  HENT: Negative for congestion, sinus pain and sore throat.   Eyes: Negative for blurred vision and pain.  Respiratory: Negative for cough and wheezing.   Cardiovascular: Negative for chest pain and leg swelling.  Gastrointestinal: Negative for abdominal pain, constipation, diarrhea, heartburn, nausea and vomiting.  Genitourinary: Negative  for dysuria, frequency, hematuria and urgency.  Musculoskeletal: Negative for back pain, joint pain, myalgias and neck pain.  Skin: Negative for itching and rash.  Neurological: Negative for dizziness, tremors and weakness.  Endo/Heme/Allergies: Does not bruise/bleed easily.  Psychiatric/Behavioral: Negative for depression. The patient is not nervous/anxious and does not have insomnia.     Objective: BP 120/80   Ht 5\' 5"  (1.651 m)   Wt 167 lb (75.8 kg)   BMI 27.79 kg/m   Filed Weights   08/14/19 1454  Weight: 167 lb (75.8 kg)   Body mass index is 27.79 kg/m. Physical Exam Constitutional:      General: She is not in acute distress.    Appearance: She is well-developed.  Genitourinary:     Pelvic exam was performed with patient supine.     Vagina, uterus and rectum normal.     No lesions in the vagina.     No vaginal bleeding.     No cervical motion tenderness, friability, lesion or polyp.     IUD strings visualized.     Uterus is mobile.     Uterus is not enlarged.     No uterine mass detected.    Uterus is midaxial.     No right or left adnexal mass present.     Right adnexa not tender.     Left adnexa not tender.  HENT:     Head: Normocephalic and atraumatic. No laceration.  Right Ear: Hearing normal.     Left Ear: Hearing normal.     Mouth/Throat:     Pharynx: Uvula midline.  Eyes:     Pupils: Pupils are equal, round, and reactive to light.  Neck:     Musculoskeletal: Normal range of motion and neck supple.     Thyroid: No thyromegaly.  Cardiovascular:     Rate and Rhythm: Normal rate and regular rhythm.     Heart sounds: No murmur. No friction rub. No gallop.   Pulmonary:     Effort: Pulmonary effort is normal. No respiratory distress.     Breath sounds: Normal breath sounds. No wheezing.  Chest:     Breasts:        Right: No mass, skin change or tenderness.        Left: No mass, skin change or tenderness.  Abdominal:     General: Bowel sounds are  normal. There is no distension.     Palpations: Abdomen is soft.     Tenderness: There is no abdominal tenderness. There is no rebound.  Musculoskeletal: Normal range of motion.  Neurological:     Mental Status: She is alert and oriented to person, place, and time.     Cranial Nerves: No cranial nerve deficit.  Skin:    General: Skin is warm and dry.  Psychiatric:        Judgment: Judgment normal.  Vitals signs reviewed.     Assessment:  ANNUAL EXAM 1. Women's annual routine gynecological examination   2. Encounter for removal and reinsertion of intrauterine contraceptive device   3. Screening for cervical cancer      Screening Plan:            1.  Cervical Screening-  Pap smear done today  2. Breast screening- Exam annually and mammogram>40 planned   3. Labs managed by PCP  4. Counseling for contraception: IUD exchange today    F/U  Return in about 1 year (around 08/13/2020) for Annual.   IUD Removal Pelvic exam:  Two IUD strings present seen coming from the cervical os. EGBUS, vaginal vault and cervix: within normal limits  Strings of IUD identified and grasped.  IUD removed without problem.  Pt tolerated this well.  IUD noted to be intact.  IUD PROCEDURE NOTE: Patient identified, informed consent performed, consent signed.   Discussed risks of irregular bleeding, cramping, infection, malpositioning or misplacement of the IUD outside the uterus which may require further procedure such as laparoscopy, risk of failure <1%. Time out was performed.  Urine pregnancy test negative.  A bimanual exam showed the uterus to be midposition.  Speculum placed in the vagina.  Cervix visualized.  Cleaned with Betadine x 2.  Grasped anteriorly with a single tooth tenaculum.  Uterus sounded to 7 cm.   IUD placed per manufacturer's recommendations.  Strings trimmed to 3 cm. Tenaculum was removed, good hemostasis noted.  Patient tolerated procedure well.   Patient was given  post-procedure instructions.  She was advised to have backup contraception for one week.  Patient was also asked to check IUD strings periodically and follow up in 4 weeks for IUD check.  Barnett Applebaum, MD, [facility], [facility] 08/14/2019  3:00 PM

## 2019-08-14 NOTE — Telephone Encounter (Signed)
Mirena reserved for this patient.

## 2019-08-17 LAB — CYTOLOGY - PAP
Diagnosis: NEGATIVE
High risk HPV: NEGATIVE
Molecular Disclaimer: 56
Molecular Disclaimer: DETECTED
Molecular Disclaimer: NORMAL

## 2019-09-11 ENCOUNTER — Ambulatory Visit: Payer: BC Managed Care – PPO | Admitting: Obstetrics & Gynecology

## 2019-09-29 ENCOUNTER — Other Ambulatory Visit: Payer: Self-pay

## 2019-09-29 ENCOUNTER — Ambulatory Visit (INDEPENDENT_AMBULATORY_CARE_PROVIDER_SITE_OTHER): Payer: BC Managed Care – PPO | Admitting: Internal Medicine

## 2019-09-29 VITALS — BP 122/78 | HR 86 | Temp 98.0°F | Resp 16 | Ht 64.0 in | Wt 166.8 lb

## 2019-09-29 DIAGNOSIS — Z23 Encounter for immunization: Secondary | ICD-10-CM | POA: Diagnosis not present

## 2019-09-29 DIAGNOSIS — F439 Reaction to severe stress, unspecified: Secondary | ICD-10-CM

## 2019-09-29 DIAGNOSIS — Z0000 Encounter for general adult medical examination without abnormal findings: Secondary | ICD-10-CM

## 2019-09-29 DIAGNOSIS — Z13220 Encounter for screening for lipoid disorders: Secondary | ICD-10-CM | POA: Diagnosis not present

## 2019-09-29 DIAGNOSIS — Z1231 Encounter for screening mammogram for malignant neoplasm of breast: Secondary | ICD-10-CM

## 2019-09-29 MED ORDER — SERTRALINE HCL 50 MG PO TABS: 50.0000 mg | ORAL_TABLET | Freq: Every day | ORAL | 2 refills | Status: DC

## 2019-09-29 NOTE — Progress Notes (Signed)
Patient ID: Tara Davila, female   DOB: [birth year], 38 y.o.   MRN: [id number]   Subjective:    Patient ID: Tara Davila, female    DOB: [birth year], 38 y.o.   MRN: [id number]  HPI  Patient here for her physical exam.  Increased stress.  Increased stress with her son's health issues.  Also increased stress with work. Discussed with her today. She was previously on zoloft.  Does feels she needs to be back on the medication.  Discussed counseling.  She tries to stay active.  No chest pain.  No sob.  No acid reflux.  No abdominal pain.  Bowels moving.  Sees gyn for breast, pelvic and pap smears.  Up to date.  Just evaluated 08/14/19.     Past Medical History:  Diagnosis Date  . Allergy   . History of chicken pox   . Hx: UTI (urinary tract infection)    Past Surgical History:  Procedure Laterality Date  . WISDOM TOOTH EXTRACTION     Family History  Problem Relation Age of Onset  . Hyperlipidemia Mother   . Hypertension Father   . Cancer Maternal Grandmother        breast  . Hyperlipidemia Maternal Grandfather   . Heart disease Maternal Grandfather   . Hypertension Maternal Grandfather   . Stroke Paternal Grandmother    Social History   Socioeconomic History  . Marital status: Married    Spouse name: Not on file  . Number of children: Not on file  . Years of education: Not on file  . Highest education level: Not on file  Occupational History  . Not on file  Social Needs  . Financial resource strain: Not on file  . Food insecurity    Worry: Not on file    Inability: Not on file  . Transportation needs    Medical: Not on file    Non-medical: Not on file  Tobacco Use  . Smoking status: Never Smoker  . Smokeless tobacco: Never Used  Substance and Sexual Activity  . Alcohol use: Yes    Alcohol/week: 0.0 standard drinks  . Drug use: No  . Sexual activity: Not on file  Lifestyle  . Physical activity    Days per week: Not on file    Minutes per session: Not on  file  . Stress: Not on file  Relationships  . Social Herbalist on phone: Not on file    Gets together: Not on file    Attends religious service: Not on file    Active member of club or organization: Not on file    Attends meetings of clubs or organizations: Not on file    Relationship status: Not on file  Other Topics Concern  . Not on file  Social History Narrative  . Not on file    Outpatient Encounter Medications as of 09/29/2019  Medication Sig  . sertraline (ZOLOFT) 50 MG tablet Take 1 tablet (50 mg total) by mouth daily.  . [DISCONTINUED] sertraline (ZOLOFT) 50 MG tablet Take 1 1/2 tablet q day (Patient not taking: Reported on 08/14/2019)   No facility-administered encounter medications on file as of 09/29/2019.    Review of Systems  Constitutional: Negative for appetite change and unexpected weight change.  HENT: Negative for congestion and sinus pressure.   Eyes: Negative for pain and visual disturbance.  Respiratory: Negative for cough, chest tightness and shortness of breath.   Cardiovascular: Negative for chest pain, palpitations  and leg swelling.  Gastrointestinal: Negative for abdominal pain, diarrhea, nausea and vomiting.  Genitourinary: Negative for difficulty urinating and dysuria.  Musculoskeletal: Negative for joint swelling and myalgias.  Skin: Negative for color change and rash.  Neurological: Negative for dizziness, light-headedness and headaches.  Hematological: Negative for adenopathy. Does not bruise/bleed easily.  Psychiatric/Behavioral: Negative for agitation and dysphoric mood.       Objective:    Physical Exam Constitutional:      General: She is not in acute distress.    Appearance: Normal appearance. She is well-developed.  HENT:     Head: Normocephalic and atraumatic.     Right Ear: External ear normal.     Left Ear: External ear normal.  Eyes:     General: No scleral icterus.       Right eye: No discharge.        Left eye: No  discharge.     Conjunctiva/sclera: Conjunctivae normal.  Neck:     Musculoskeletal: Neck supple. No muscular tenderness.     Thyroid: No thyromegaly.  Cardiovascular:     Rate and Rhythm: Normal rate and regular rhythm.  Pulmonary:     Effort: No tachypnea, accessory muscle usage or respiratory distress.     Breath sounds: Normal breath sounds. No decreased breath sounds or wheezing.  Chest:     Breasts:        Right: No inverted nipple, mass, nipple discharge or tenderness (no axillary adenopathy).        Left: No inverted nipple, mass, nipple discharge or tenderness (no axilarry adenopathy).  Abdominal:     General: Bowel sounds are normal.     Palpations: Abdomen is soft.     Tenderness: There is no abdominal tenderness.  Musculoskeletal:        General: No swelling or tenderness.  Lymphadenopathy:     Cervical: No cervical adenopathy.  Skin:    Findings: No erythema or rash.  Neurological:     Mental Status: She is alert and oriented to person, place, and time.  Psychiatric:        Mood and Affect: Mood normal.        Behavior: Behavior normal.     BP 122/78   Pulse 86   Temp 98 F (36.7 C)   Resp 16   Ht 5\' 4"  (1.626 m)   Wt 166 lb 12.8 oz (75.7 kg)   SpO2 99%   BMI 28.63 kg/m  Wt Readings from Last 3 Encounters:  09/29/19 166 lb 12.8 oz (75.7 kg)  08/14/19 167 lb (75.8 kg)  05/29/16 166 lb 4 oz (75.4 kg)     Lab Results  Component Value Date   WBC 9.8 09/29/2019   HGB 12.7 09/29/2019   HCT 38.8 09/29/2019   PLT 234 09/29/2019   GLUCOSE 72 09/29/2019   CHOL 146 09/29/2019   TRIG 102 09/29/2019   HDL 47 (L) 09/29/2019   LDLCALC 80 09/29/2019   ALT 10 09/29/2019   AST 11 09/29/2019   NA 137 09/29/2019   K 3.8 09/29/2019   CL 105 09/29/2019   CREATININE 0.66 09/29/2019   BUN 8 09/29/2019   CO2 24 09/29/2019   TSH 2.15 09/29/2019       Assessment & Plan:   Problem List Items Addressed This Visit    Health care maintenance    Physical today.   GYN (Dr Kenton Kingfisher) does her breast, pelvic and pap smears. PAP 07/2019 - negative with negative HPV.  After discussion, she request baseline mammogram.  Ordered.       Stress - Primary    Increased stress as outlined. Discussed with her today. Discussed counseling.  She feels she needs to restart zoloft. Will start with 25mg  q day for one week and then increase to 50mg  q day.  Schedule f/u soon to reassess.  No suicidal ideations.  Follow.       Relevant Orders   CBC with Differential/Platelet (Completed)   Comprehensive metabolic panel (Completed)   TSH (Completed)    Other Visit Diagnoses    Need for immunization against influenza       Relevant Orders   Flu Vaccine QUAD 36+ mos IM (Completed)   Screening cholesterol level       Relevant Orders   Lipid panel (Completed)   Encounter for screening mammogram for malignant neoplasm of breast       Relevant Orders   MM 3D SCREEN BREAST BILATERAL       Einar Pheasant, MD

## 2019-09-29 NOTE — Patient Instructions (Signed)
Start zoloft 50mg  - 1/2 tablet per day for the first week and then one per day.

## 2019-09-30 ENCOUNTER — Encounter: Payer: Self-pay | Admitting: Internal Medicine

## 2019-09-30 LAB — CBC WITH DIFFERENTIAL/PLATELET
Absolute Monocytes: 637 cells/uL (ref 200–950)
Basophils Absolute: 39 cells/uL (ref 0–200)
Basophils Relative: 0.4 %
Eosinophils Absolute: 98 cells/uL (ref 15–500)
Eosinophils Relative: 1 %
HCT: 38.8 % (ref 35.0–45.0)
Hemoglobin: 12.7 g/dL (ref 11.7–15.5)
Lymphs Abs: 2558 cells/uL (ref 850–3900)
MCH: 30 pg (ref 27.0–33.0)
MCHC: 32.7 g/dL (ref 32.0–36.0)
MCV: 91.7 fL (ref 80.0–100.0)
MPV: 12.2 fL (ref 7.5–12.5)
Monocytes Relative: 6.5 %
Neutro Abs: 6468 cells/uL (ref 1500–7800)
Neutrophils Relative %: 66 %
Platelets: 234 10*3/uL (ref 140–400)
RBC: 4.23 10*6/uL (ref 3.80–5.10)
RDW: 12.8 % (ref 11.0–15.0)
Total Lymphocyte: 26.1 %
WBC: 9.8 10*3/uL (ref 3.8–10.8)

## 2019-09-30 LAB — COMPREHENSIVE METABOLIC PANEL
AG Ratio: 1.7 (calc) (ref 1.0–2.5)
ALT: 10 U/L (ref 6–29)
AST: 11 U/L (ref 10–30)
Albumin: 4.4 g/dL (ref 3.6–5.1)
Alkaline phosphatase (APISO): 59 U/L (ref 31–125)
BUN: 8 mg/dL (ref 7–25)
CO2: 24 mmol/L (ref 20–32)
Calcium: 9.4 mg/dL (ref 8.6–10.2)
Chloride: 105 mmol/L (ref 98–110)
Creat: 0.66 mg/dL (ref 0.50–1.10)
Globulin: 2.6 g/dL (calc) (ref 1.9–3.7)
Glucose, Bld: 72 mg/dL (ref 65–99)
Potassium: 3.8 mmol/L (ref 3.5–5.3)
Sodium: 137 mmol/L (ref 135–146)
Total Bilirubin: 0.5 mg/dL (ref 0.2–1.2)
Total Protein: 7 g/dL (ref 6.1–8.1)

## 2019-09-30 LAB — LIPID PANEL
Cholesterol: 146 mg/dL (ref <200)
HDL: 47 mg/dL — ABNORMAL LOW (ref >=50)
LDL Cholesterol (Calc): 80 mg/dL (calc)
Non-HDL Cholesterol (Calc): 99 mg/dL (calc) (ref <130)
Total CHOL/HDL Ratio: 3.1 (calc) (ref <5.0)
Triglycerides: 102 mg/dL (ref <150)

## 2019-09-30 LAB — TSH: TSH: 2.15 mIU/L

## 2019-10-02 ENCOUNTER — Encounter: Payer: Self-pay | Admitting: Internal Medicine

## 2019-10-02 NOTE — Assessment & Plan Note (Signed)
Physical today.  GYN (Dr Kenton Kingfisher) does her breast, pelvic and pap smears. PAP 07/2019 - negative with negative HPV.  After discussion, she request baseline mammogram.  Ordered.

## 2019-10-02 NOTE — Assessment & Plan Note (Signed)
Increased stress as outlined. Discussed with her today. Discussed counseling.  She feels she needs to restart zoloft. Will start with 25mg  q day for one week and then increase to 50mg  q day.  Schedule f/u soon to reassess.  No suicidal ideations.  Follow.

## 2019-10-04 ENCOUNTER — Encounter: Payer: Self-pay | Admitting: Internal Medicine

## 2019-10-23 ENCOUNTER — Other Ambulatory Visit: Payer: Self-pay | Admitting: Internal Medicine

## 2019-11-07 ENCOUNTER — Ambulatory Visit (INDEPENDENT_AMBULATORY_CARE_PROVIDER_SITE_OTHER): Payer: BC Managed Care – PPO | Admitting: Internal Medicine

## 2019-11-07 ENCOUNTER — Other Ambulatory Visit: Payer: Self-pay

## 2019-11-07 ENCOUNTER — Encounter: Payer: Self-pay | Admitting: Internal Medicine

## 2019-11-07 DIAGNOSIS — F439 Reaction to severe stress, unspecified: Secondary | ICD-10-CM

## 2019-11-07 NOTE — Progress Notes (Signed)
Patient ID: Tara Davila, female   DOB: [birth year], 38 y.o.   MRN: [id number]   Virtual Visit via video Note  This visit type was conducted due to national recommendations for restrictions regarding the COVID-19 pandemic (e.g. social distancing).  This format is felt to be most appropriate for this patient at this time.  All issues noted in this document were discussed and addressed.  No physical exam was performed (except for noted visual exam findings with Video Visits).   I connected with [name] by a video enabled telemedicine application and verified that I am speaking with the correct person using two identifiers. Location patient: home Location provider: work  Persons participating in the virtual visit: patient, provider  I discussed the limitations, risks, security and privacy concerns of performing an evaluation and management service by video and the availability of in person appointments.  The patient expressed understanding and agreed to proceed.   Reason for visit: scheduled follow up.   HPI: Seeing for f/u visit.  Increased stress with work and family.  See last note.  Started on zoloft.  Is tolerating.  States may be helping some, but has not noticed a big change.  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  Son is being evaluated.  Going to therapy.  Discussed increasing zoloft dose.  Had some intolerance to high dose previously.  Willing to try to increase to 75mg  q day.  Sleeping.  Bowels stable.  Planning for mammogram tomorrow.     ROS: See pertinent positives and negatives per HPI.  Past Medical History:  Diagnosis Date  . Allergy   . History of chicken pox   . Hx: UTI (urinary tract infection)     Past Surgical History:  Procedure Laterality Date  . WISDOM TOOTH EXTRACTION      Family History  Problem Relation Age of Onset  . Hyperlipidemia Mother   . Hypertension Father   . Cancer Maternal Grandmother        breast  . Breast cancer Maternal  Grandmother 10  . Hyperlipidemia Maternal Grandfather   . Heart disease Maternal Grandfather   . Hypertension Maternal Grandfather   . Stroke Paternal Grandmother   . Breast cancer Paternal Aunt     SOCIAL HX: reviewed.    Current Outpatient Medications:  .  sertraline (ZOLOFT) 50 MG tablet, Take 1 1/2 tablet q day, Disp: 135 tablet, Rfl: 1  EXAM:  GENERAL: alert, oriented, appears well and in no acute distress  HEENT: atraumatic, conjunttiva clear, no obvious abnormalities on inspection of external nose and ears  NECK: normal movements of the head and neck  LUNGS: on inspection no signs of respiratory distress, breathing rate appears normal, no obvious gross SOB, gasping or wheezing  CV: no obvious cyanosis  PSYCH/NEURO: pleasant and cooperative, no obvious depression or anxiety, speech and thought processing grossly intact  ASSESSMENT AND PLAN:  Discussed the following assessment and plan:  Stress Increased stress as outlined.  On zoloft.  Discussed adjusting the dose.  She had questionable intolerance to higher doses previously.  Will increase to 75mg  q day.  Follow.  Get a follow up soon to reassess.      I discussed the assessment and treatment plan with the patient. The patient was provided an opportunity to ask questions and all were answered. The patient agreed with the plan and demonstrated an understanding of the instructions.   The patient was advised to call back or seek an in-person evaluation  if the symptoms worsen or if the condition fails to improve as anticipated.   Einar Pheasant, MD

## 2019-11-08 ENCOUNTER — Ambulatory Visit
Admission: RE | Admit: 2019-11-08 | Discharge: 2019-11-08 | Disposition: A | Discharge status: Home / Self Care | Payer: BC Managed Care – PPO | Source: Ambulatory Visit | Attending: Internal Medicine | Admitting: Internal Medicine

## 2019-11-08 ENCOUNTER — Other Ambulatory Visit: Payer: Self-pay

## 2019-11-08 DIAGNOSIS — Z1231 Encounter for screening mammogram for malignant neoplasm of breast: Secondary | ICD-10-CM | POA: Insufficient documentation

## 2019-11-10 ENCOUNTER — Other Ambulatory Visit: Payer: Self-pay | Admitting: Internal Medicine

## 2019-11-10 DIAGNOSIS — R928 Other abnormal and inconclusive findings on diagnostic imaging of breast: Secondary | ICD-10-CM

## 2019-11-10 DIAGNOSIS — N6310 Unspecified lump in the right breast, unspecified quadrant: Secondary | ICD-10-CM

## 2019-11-10 DIAGNOSIS — N6320 Unspecified lump in the left breast, unspecified quadrant: Secondary | ICD-10-CM

## 2019-11-12 ENCOUNTER — Encounter: Payer: Self-pay | Admitting: Internal Medicine

## 2019-11-12 MED ORDER — SERTRALINE HCL 50 MG PO TABS: ORAL_TABLET | ORAL | 1 refills | Status: DC

## 2019-11-12 NOTE — Assessment & Plan Note (Signed)
Increased stress as outlined.  On zoloft.  Discussed adjusting the dose.  She had questionable intolerance to higher doses previously.  Will increase to 75mg  q day.  Follow.  Get a follow up soon to reassess.

## 2019-11-20 ENCOUNTER — Ambulatory Visit
Admission: RE | Admit: 2019-11-20 | Discharge: 2019-11-20 | Disposition: A | Discharge status: Home / Self Care | Payer: BC Managed Care – PPO | Source: Ambulatory Visit | Attending: Internal Medicine | Admitting: Internal Medicine

## 2019-11-20 DIAGNOSIS — N6310 Unspecified lump in the right breast, unspecified quadrant: Secondary | ICD-10-CM | POA: Insufficient documentation

## 2019-11-20 DIAGNOSIS — R928 Other abnormal and inconclusive findings on diagnostic imaging of breast: Secondary | ICD-10-CM | POA: Diagnosis present

## 2019-11-20 DIAGNOSIS — N6320 Unspecified lump in the left breast, unspecified quadrant: Secondary | ICD-10-CM | POA: Insufficient documentation

## 2019-11-21 NOTE — Progress Notes (Signed)
Patient voiced understanding to results and she would like a referral for second opinion preference of surgeon Burnette  or PCP choice.

## 2019-11-22 ENCOUNTER — Other Ambulatory Visit: Payer: Self-pay | Admitting: Internal Medicine

## 2019-11-22 DIAGNOSIS — R928 Other abnormal and inconclusive findings on diagnostic imaging of breast: Secondary | ICD-10-CM

## 2019-11-22 NOTE — Progress Notes (Signed)
Order placed for referral to surgery.  

## 2019-11-28 ENCOUNTER — Ambulatory Visit: Payer: BC Managed Care – PPO | Attending: Internal Medicine

## 2019-11-28 DIAGNOSIS — Z20822 Contact with and (suspected) exposure to covid-19: Secondary | ICD-10-CM

## 2019-12-01 LAB — NOVEL CORONAVIRUS, NAA: SARS-CoV-2, NAA: NOT DETECTED

## 2020-01-04 ENCOUNTER — Ambulatory Visit (INDEPENDENT_AMBULATORY_CARE_PROVIDER_SITE_OTHER): Payer: BC Managed Care – PPO | Admitting: Internal Medicine

## 2020-01-04 ENCOUNTER — Other Ambulatory Visit: Payer: Self-pay

## 2020-01-04 DIAGNOSIS — R928 Other abnormal and inconclusive findings on diagnostic imaging of breast: Secondary | ICD-10-CM | POA: Diagnosis not present

## 2020-01-04 DIAGNOSIS — R142 Eructation: Secondary | ICD-10-CM

## 2020-01-04 DIAGNOSIS — R0981 Nasal congestion: Secondary | ICD-10-CM

## 2020-01-04 DIAGNOSIS — F439 Reaction to severe stress, unspecified: Secondary | ICD-10-CM | POA: Diagnosis not present

## 2020-01-04 NOTE — Progress Notes (Addendum)
Patient ID: Tara Davila, female   DOB: 1981/11/19, 39 y.o.   MRN: [id number]   Virtual Visit via video Note  This visit type was conducted due to national recommendations for restrictions regarding the COVID-19 pandemic (e.g. social distancing).  This format is felt to be most appropriate for this patient at this time.  All issues noted in this document were discussed and addressed.  No physical exam was performed (except for noted visual exam findings with Video Visits).   I connected with [name] by a video enabled telemedicine application  and verified that I am speaking with the correct person using two identifiers. Location patient: home Location provider: work Persons participating in the virtual visit: patient, provider  The limitations, risks, security and privacy concerns of performing an evaluation and management service by video and the availability of in person appointments have been discussed. The patient expressed understanding and agreed to proceed.   Reason for visit: scheduled follow up.   HPI: States she has been doing relatively well.  Handling stress.  Stress is better.  On zoloft.  Taking 75mg  q day.  Feels this dose is working for her.  Tolerating.  Son is in therapy.  Adjustments have been made for school.  She tries to stay active.  No chest pain or sob reported.  Has noticed nose - dry.  Some blood tinge at times.  No fever.  No significant sinus pressure.  No chest congestion or cough reported.  Will notice occasional burps.  Feels bloated at times.  Some diarrhea.  No nausea or vomiting.     ROS: See pertinent positives and negatives per HPI.  Past Medical History:  Diagnosis Date  . Allergy   . History of chicken pox   . Hx: UTI (urinary tract infection)     Past Surgical History:  Procedure Laterality Date  . WISDOM TOOTH EXTRACTION      Family History  Problem Relation Age of Onset  . Hyperlipidemia Mother   . Hypertension Father   .  Cancer Maternal Grandmother        breast  . Breast cancer Maternal Grandmother 32  . Hyperlipidemia Maternal Grandfather   . Heart disease Maternal Grandfather   . Hypertension Maternal Grandfather   . Stroke Paternal Grandmother   . Breast cancer Paternal Aunt     SOCIAL HX: reviewed.    Current Outpatient Medications:  .  sertraline (ZOLOFT) 50 MG tablet, Take 1 1/2 tablet q day, Disp: 135 tablet, Rfl: 1  EXAM:  GENERAL: alert, oriented, appears well and in no acute distress  HEENT: atraumatic, conjunttiva clear, no obvious abnormalities on inspection of external nose and ears  NECK: normal movements of the head and neck  LUNGS: on inspection no signs of respiratory distress, breathing rate appears normal, no obvious gross SOB, gasping or wheezing  CV: no obvious cyanosis  PSYCH/NEURO: pleasant and cooperative, no obvious depression or anxiety, speech and thought processing grossly intact  ASSESSMENT AND PLAN:  Discussed the following assessment and plan:  Stress Has had increased stress.  See previous note.  Stress is better.  Seems to be doing well on zoloft 75mg  q day.  Continue current medication.  Follow.    Burping Burping with bowel change as outlined. Some bloating.  Mentioned IBS.  Consider probiotics daily.  Pepcid.  Follow closely.  Monitor of triggers.    Nasal congestion Saline nasal spray.  Follow.    Abnormal mammogram Saw Dr Bary Castilla 12/07/19.  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  Recommended f/u in 6 months.     I discussed the assessment and treatment plan with the patient. The patient was provided an opportunity to ask questions and all were answered. The patient agreed with the plan and demonstrated an understanding of the instructions.   The patient was advised to call back or seek an in-person evaluation if the symptoms worsen or if the condition fails to improve as anticipated.   Einar Pheasant, MD

## 2020-01-07 ENCOUNTER — Encounter: Payer: Self-pay | Admitting: Internal Medicine

## 2020-01-07 DIAGNOSIS — R142 Eructation: Secondary | ICD-10-CM | POA: Insufficient documentation

## 2020-01-07 DIAGNOSIS — R0981 Nasal congestion: Secondary | ICD-10-CM | POA: Insufficient documentation

## 2020-01-07 NOTE — Assessment & Plan Note (Signed)
Burping with bowel change as outlined. Some bloating.  Mentioned IBS.  Consider probiotics daily.  Pepcid.  Follow closely.  Monitor of triggers.

## 2020-01-07 NOTE — Assessment & Plan Note (Signed)
Has had increased stress.  See previous note.  Stress is better.  Seems to be doing well on zoloft 75mg  q day.  Continue current medication.  Follow.

## 2020-01-07 NOTE — Assessment & Plan Note (Signed)
Saline nasal spray.  Follow.

## 2020-01-08 DIAGNOSIS — R928 Other abnormal and inconclusive findings on diagnostic imaging of breast: Secondary | ICD-10-CM | POA: Insufficient documentation

## 2020-01-08 NOTE — Assessment & Plan Note (Signed)
Saw Dr Bary Castilla 12/07/19.  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  Recommended f/u in 6 months.

## 2020-01-24 ENCOUNTER — Ambulatory Visit: Payer: BC Managed Care – PPO | Attending: Internal Medicine

## 2020-01-24 DIAGNOSIS — Z20822 Contact with and (suspected) exposure to covid-19: Secondary | ICD-10-CM

## 2020-01-25 LAB — NOVEL CORONAVIRUS, NAA: SARS-CoV-2, NAA: NOT DETECTED

## 2020-01-30 ENCOUNTER — Encounter: Payer: Self-pay | Admitting: Internal Medicine

## 2020-01-30 ENCOUNTER — Other Ambulatory Visit: Payer: Self-pay

## 2020-01-30 ENCOUNTER — Telehealth (INDEPENDENT_AMBULATORY_CARE_PROVIDER_SITE_OTHER): Payer: BC Managed Care – PPO | Admitting: Internal Medicine

## 2020-01-30 VITALS — Ht 65.0 in | Wt 166.0 lb

## 2020-01-30 DIAGNOSIS — L509 Urticaria, unspecified: Secondary | ICD-10-CM | POA: Diagnosis not present

## 2020-01-30 DIAGNOSIS — J329 Chronic sinusitis, unspecified: Secondary | ICD-10-CM

## 2020-01-30 DIAGNOSIS — K582 Mixed irritable bowel syndrome: Secondary | ICD-10-CM

## 2020-01-30 DIAGNOSIS — J309 Allergic rhinitis, unspecified: Secondary | ICD-10-CM

## 2020-01-30 MED ORDER — AZITHROMYCIN 250 MG PO TABS: ORAL_TABLET | ORAL | 0 refills | Status: DC

## 2020-01-30 NOTE — Progress Notes (Signed)
Virtual Visit via Video Note  I connected with Tara Davila  on 01/30/20 at  8:45 AM EST by a video enabled telemedicine application and verified that I am speaking with the correct person using two identifiers.  Location patient: home Location provider:work or home office Persons participating in the virtual visit: patient, provider  I discussed the limitations of evaluation and management by telemedicine and the availability of in person appointments. The patient expressed understanding and agreed to proceed.   HPI: 1. Sick visit 01/18/20 last exposed to brother his son, fiance due to mom keeps her kids and brothers kids on 01/24/20 brother and family tested + for covid 93.  01/23/20 she became ill with nasal congestion, cough at night, sinus pressure worse at night, dry nasal passages with blood and she has not been feeling well x 1 week so wanted appt today 01/24/20 covid 19 test negative. Sinus congestion resolved but still sinus pressure and ears ringing.  Tried claritin, mucinex DM, nasal saline  Denies fever, body aches, +cough at night tries mucinex and helps   2. Hives 2 weeks prior to feeling ill above and h/o environmental allergies tried claritin No new exposures or products   3. 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  Past Medical History:  Diagnosis Date  . Allergy   . History of chicken pox   . Hx: UTI (urinary tract infection)     Past Surgical History:  Procedure Laterality Date  . WISDOM TOOTH EXTRACTION      Family History  Problem Relation Age of Onset  . Hyperlipidemia Mother   . Hypertension Father   . Cancer Maternal Grandmother        breast  . Breast cancer Maternal Grandmother 38  . Hyperlipidemia Maternal Grandfather   . Heart disease Maternal  Grandfather   . Hypertension Maternal Grandfather   . Stroke Paternal Grandmother   . Breast cancer Paternal Aunt     SOCIAL HX:  Lives at home  Has kids   Current Outpatient Medications:  .  Loratadine (CLARITIN PO), Take by mouth., Disp: , Rfl:  .  Pseudoephedrine-guaiFENesin (MUCINEX D PO), Take by mouth., Disp: , Rfl:  .  sertraline (ZOLOFT) 50 MG tablet, Take 1 1/2 tablet q day, Disp: 135 tablet, Rfl: 1 .  azithromycin (ZITHROMAX) 250 MG tablet, 2 pills day 1 and 1 pill day 2-5, Disp: 6 tablet, Rfl: 0  EXAM:  VITALS per patient if applicable:  GENERAL: alert, oriented, appears well and in no acute distress  HEENT: atraumatic, conjunttiva clear, no obvious abnormalities on inspection of external nose and ears  NECK: normal movements of the head and neck  LUNGS: on inspection no signs of respiratory distress, breathing rate appears normal, no obvious gross SOB, gasping or wheezing  CV: no obvious cyanosis  MS: moves all visible extremities without noticeable abnormality  PSYCH/NEURO: pleasant and cooperative, no obvious depression or anxiety, speech and thought processing grossly intact  ASSESSMENT AND PLAN:  Discussed the following assessment and plan:  Sinusitis,allergies - Plan: azithromycin (ZITHROMAX) 250 MG tablet x 5 days, NS, mucinex DM at night, hydration and mvt  If not better rec re test covid 3/11 or 3/12 may have tested too soon after exposure to her brother and his family  Prn Claritin  My chart if not better and retest covid 19   Irritable bowel syndrome with both constipation and diarrhea - Plan: Ambulatory referral to Gastroenterology for 1 month out Dr. Marius Ditch or Dr. Darene Lamer  Allergic rhinitis, unspecified seasonality, unspecified trigger - Plan: Ambulatory referral to Allergy Hives - Plan: Ambulatory referral to Allergy Dr. Orvil Feil -for 1 month out referral   -we discussed possible serious and likely etiologies, options for evaluation and workup,  limitations of telemedicine visit vs in person visit, treatment, treatment risks and precautions. Pt prefers to treat via telemedicine empirically rather then risking or undertaking an in person visit at this moment. Patient agrees to seek prompt in person care if worsening, new symptoms arise, or if is not improving with treatment.   I discussed the assessment and treatment plan with the patient. The patient was provided an opportunity to ask questions and all were answered. The patient agreed with the plan and demonstrated an understanding of the instructions.   The patient was advised to call back or seek an in-person evaluation if the symptoms worsen or if the condition fails to improve as anticipated.  Time spent 20-29 minutes Delorise Jackson, MD

## 2020-01-30 NOTE — Patient Instructions (Signed)
Sinusitis, Adult Sinusitis is inflammation of your sinuses. Sinuses are hollow spaces in the bones around your face. Your sinuses are located:  Around your eyes.  In the middle of your forehead.  Behind your nose.  In your cheekbones. Mucus normally drains out of your sinuses. When your nasal tissues become inflamed or swollen, mucus can become trapped or blocked. This allows bacteria, viruses, and fungi to grow, which leads to infection. Most infections of the sinuses are caused by a virus. Sinusitis can develop quickly. It can last for up to 4 weeks (acute) or for more than 12 weeks (chronic). Sinusitis often develops after a cold. What are the causes? This condition is caused by anything that creates swelling in the sinuses or stops mucus from draining. This includes:  Allergies.  Asthma.  Infection from bacteria or viruses.  Deformities or blockages in your nose or sinuses.  Abnormal growths in the nose (nasal polyps).  Pollutants, such as chemicals or irritants in the air.  Infection from fungi (rare). What increases the risk? You are more likely to develop this condition if you:  Have a weak body defense system (immune system).  Do a lot of swimming or diving.  Overuse nasal sprays.  Smoke. What are the signs or symptoms? The main symptoms of this condition are pain and a feeling of pressure around the affected sinuses. Other symptoms include:  Stuffy nose or congestion.  Thick drainage from your nose.  Swelling and warmth over the affected sinuses.  Headache.  Upper toothache.  A cough that may get worse at night.  Extra mucus that collects in the throat or the back of the nose (postnasal drip).  Decreased sense of smell and taste.  Fatigue.  A fever.  Sore throat.  Bad breath. How is this diagnosed? This condition is diagnosed based on:  Your symptoms.  Your medical history.  A physical exam.  Tests to find out if your condition is  acute or chronic. This may include: ? Checking your nose for nasal polyps. ? Viewing your sinuses using a device that has a light (endoscope). ? Testing for allergies or bacteria. ? Imaging tests, such as an MRI or CT scan. In rare cases, a bone biopsy may be done to rule out more serious types of fungal sinus disease. How is this treated? Treatment for sinusitis depends on the cause and whether your condition is chronic or acute.  If caused by a virus, your symptoms should go away on their own within 10 days. You may be given medicines to relieve symptoms. They include: ? Medicines that shrink swollen nasal passages (topical intranasal decongestants). ? Medicines that treat allergies (antihistamines). ? A spray that eases inflammation of the nostrils (topical intranasal corticosteroids). ? Rinses that help get rid of thick mucus in your nose (nasal saline washes).  If caused by bacteria, your health care provider may recommend waiting to see if your symptoms improve. Most bacterial infections will get better without antibiotic medicine. You may be given antibiotics if you have: ? A severe infection. ? A weak immune system.  If caused by narrow nasal passages or nasal polyps, you may need to have surgery. Follow these instructions at home: Medicines  Take, use, or apply over-the-counter and prescription medicines only as told by your health care provider. These may include nasal sprays.  If you were prescribed an antibiotic medicine, take it as told by your health care provider. Do not stop taking the antibiotic even if you start   to feel better. Hydrate and humidify   Drink enough fluid to keep your urine pale yellow. Staying hydrated will help to thin your mucus.  Use a cool mist humidifier to keep the humidity level in your home above 50%.  Inhale steam for 10-15 minutes, 3-4 times a day, or as told by your health care provider. You can do this in the bathroom while a hot shower is  running.  Limit your exposure to cool or dry air. Rest  Rest as much as possible.  Sleep with your head raised (elevated).  Make sure you get enough sleep each night. General instructions   Apply a warm, moist washcloth to your face 3-4 times a day or as told by your health care provider. This will help with discomfort.  Wash your hands often with soap and water to reduce your exposure to germs. If soap and water are not available, use hand sanitizer.  Do not smoke. Avoid being around people who are smoking (secondhand smoke).  Keep all follow-up visits as told by your health care provider. This is important. Contact a health care provider if:  You have a fever.  Your symptoms get worse.  Your symptoms do not improve within 10 days. Get help right away if:  You have a severe headache.  You have persistent vomiting.  You have severe pain or swelling around your face or eyes.  You have vision problems.  You develop confusion.  Your neck is stiff.  You have trouble breathing. Summary  Sinusitis is soreness and inflammation of your sinuses. Sinuses are hollow spaces in the bones around your face.  This condition is caused by nasal tissues that become inflamed or swollen. The swelling traps or blocks the flow of mucus. This allows bacteria, viruses, and fungi to grow, which leads to infection.  If you were prescribed an antibiotic medicine, take it as told by your health care provider. Do not stop taking the antibiotic even if you start to feel better.  Keep all follow-up visits as told by your health care provider. This is important. This information is not intended to replace advice given to you by your health care provider. Make sure you discuss any questions you have with your health care provider. Document Revised: 04/11/2018 Document Reviewed: 04/11/2018 Elsevier Patient Education  [place] (urticaria) are itchy, red, swollen areas on  the skin. Hives can appear on any part of the body. Hives often fade within 24 hours (acute hives). Sometimes, new hives appear after old ones fade and the cycle can continue for several days or weeks (chronic hives). Hives do not spread from person to person (are not contagious). Hives come from the body's reaction to something a person is allergic to (allergen), something that causes irritation, or various other triggers. When a person is exposed to a trigger, his or her body releases a chemical (histamine) that causes redness, itching, and swelling. Hives can appear right after exposure to a trigger or hours later. What are the causes? This condition may be caused by:  Allergies to foods or ingredients.  Insect bites or stings.  Exposure to pollen or pets.  Contact with latex or chemicals.  Spending time in sunlight, heat, or cold (exposure).  Exercise.  Stress.  Certain medicines. You can also get hives from other medical conditions and treatments, such as:  Viruses, including the common cold.  Bacterial infections, such as urinary tract infections and strep throat.  Certain medicines.  Allergy shots.  Blood transfusions. Sometimes, the cause of this condition is not known (idiopathic hives). What increases the risk? You are more likely to develop this condition if you:  Are a woman.  Have food allergies, especially to citrus fruits, milk, eggs, peanuts, tree nuts, or shellfish.  Are allergic to: ? Medicines. ? Latex. ? Insects. ? Animals. ? Pollen. What are the signs or symptoms? Common symptoms of this condition include raised, itchy, red or white bumps or patches on your skin. These areas may:  Become large and swollen (welts).  Change in shape and location, quickly and repeatedly.  Be separate hives or connect over a large area of skin.  Sting or become painful.  Turn white when pressed in the center (blanch). In severe cases, yourhands, feet, and  face may also become swollen. This may occur if hives develop deeper in your skin. How is this diagnosed? This condition may be diagnosed by your symptoms, medical history, and physical exam.  Your skin, urine, or blood may be tested to find out what is causing your hives and to rule out other health issues.  Your health care provider may also remove a small sample of skin from the affected area and examine it under a microscope (biopsy). How is this treated? Treatment for this condition depends on the cause and severity of your symptoms. Your health care provider may recommend using cool, wet cloths (cool compresses) or taking cool showers to relieve itching. Treatment may include:  Medicines that help: ? Relieve itching (antihistamines). ? Reduce swelling (corticosteroids). ? Treat infection (antibiotics).  An injectable medicine (omalizumab). Your health care provider may prescribe this if you have chronic idiopathic hives and you continue to have symptoms even after treatment with antihistamines. Severe cases may require an emergency injection of adrenaline (epinephrine) to prevent a life-threatening allergic reaction (anaphylaxis). Follow these instructions at home: Medicines  Take and apply over-the-counter and prescription medicines only as told by your health care provider.  If you were prescribed an antibiotic medicine, take it as told by your health care provider. Do not stop using the antibiotic even if you start to feel better. Skin care  Apply cool compresses to the affected areas.  Do not scratch or rub your skin. General instructions  Do not take hot showers or baths. This can make itching worse.  Do not wear tight-fitting clothing.  Use sunscreen and wear protective clothing when you are outside.  Avoid any substances that cause your hives. Keep a journal to help track what causes your hives. Write down: ? What medicines you take. ? What you eat and  drink. ? What products you use on your skin.  Keep all follow-up visits as told by your health care provider. This is important. Contact a health care provider if:  Your symptoms are not controlled with medicine.  Your joints are painful or swollen. Get help right away if:  You have a fever.  You have pain in your abdomen.  Your tongue or lips are swollen.  Your eyelids are swollen.  Your chest or throat feels tight.  You have trouble breathing or swallowing. These symptoms may represent a serious problem that is an emergency. Do not wait to see if the symptoms will go away. Get medical help right away. Call your local emergency services (911 in the [place] Do not drive yourself to the hospital. Summary  Hives (urticaria) are itchy, red, swollen areas on your skin. Hives come from the body's reaction  to something a person is allergic to (allergen), something that causes irritation, or various other triggers.  Treatment for this condition depends on the cause and severity of your symptoms.  Avoid any substances that cause your hives. Keep a journal to help track what causes your hives.  Take and apply over-the-counter and prescription medicines only as told by your health care provider.  Keep all follow-up visits as told by your health care provider. This is important. This information is not intended to replace advice given to you by your health care provider. Make sure you discuss any questions you have with your health care provider. Document Revised: 05/25/2018 Document Reviewed: 05/25/2018 Elsevier Patient Education  [place].

## 2020-02-18 ENCOUNTER — Ambulatory Visit: Payer: BC Managed Care – PPO | Attending: Internal Medicine

## 2020-02-18 DIAGNOSIS — Z23 Encounter for immunization: Secondary | ICD-10-CM

## 2020-02-18 NOTE — Progress Notes (Signed)
   Covid-[facility] Vaccination [facility]  Name:  Tara Davila    MRN: [id number] DOB: 08-Jan-1981  02/18/2020  Tara Davila was observed post Covid-19 immunization for 15 minutes without incident. She was provided with Vaccine Information Sheet and instruction to access the V-Safe system.   Tara Davila was instructed to call 911 with any severe reactions post vaccine: Marland Kitchen Difficulty breathing  . Swelling of face and throat  . A fast heartbeat  . A bad rash all over body  . Dizziness and weakness   Immunizations Administered    Name Date Dose VIS Date Route   Pfizer COVID-19 Vaccine 02/18/2020 12:46 PM 0.3 mL 11/03/2019 Intramuscular   Manufacturer: [place]   Lot: [id number]   [place]: [id number]

## 2020-03-05 ENCOUNTER — Encounter: Payer: Self-pay | Admitting: Gastroenterology

## 2020-03-06 ENCOUNTER — Ambulatory Visit (INDEPENDENT_AMBULATORY_CARE_PROVIDER_SITE_OTHER): Payer: BC Managed Care – PPO | Admitting: Gastroenterology

## 2020-03-06 ENCOUNTER — Encounter: Payer: Self-pay | Admitting: Gastroenterology

## 2020-03-06 DIAGNOSIS — R1013 Epigastric pain: Secondary | ICD-10-CM | POA: Diagnosis not present

## 2020-03-06 DIAGNOSIS — R197 Diarrhea, unspecified: Secondary | ICD-10-CM

## 2020-03-06 NOTE — Progress Notes (Signed)
Tara Davila [address]  [place]  [place], [place] [address]  Main: [phone]  Fax: [phone]   Gastroenterology Consultation  Referring Provider:     McLean-Scocuzza, Olivia Mackie * Primary Care Physician:  Einar Pheasant, MD Reason for Consultation:   Diarrhea, abdominal bloating        HPI:   Virtual Visit via Video Note  I connected with patient on 03/06/20 at  2:15 PM EDT by video ([email or web address]) and verified that I am speaking with the correct person using two identifiers.   I discussed the limitations, risks, security and privacy concerns of performing an evaluation and management service by video and the availability of in person appointments. I also discussed with the patient that there may be a patient responsible charge related to this service. The patient expressed understanding and agreed to proceed.  Location of the patient: Home Location of provider: Home Participating persons: Patient and provider only (Nursing staff checked in patient via phone but were not physically involved in the video interaction - see their notes)   History of Present Illness: Chief Complaint  Patient presents with  . New Patient (Initial Visit)  . Irritable Bowel Syndrome    Patient states she has diarrhea every day. Patient states this is everytime eats 3-4 times a day.     Tara Davila is a 39 y.o. y/o female referred for consultation & management  by Dr. Einar Pheasant, MD.  Patient reports chronic history of 3-4 loose bowel movements a day.  She states that her baseline since she was in college.  However, in January of this year, which is about 4 months ago she started noticing that her bowel movements were more watery, but still only 3-4 times a day.  No blood in stool.  She made diet changes including avoiding canned foods, wheat and potatoes etc.  She states with diet changes to watery diarrhea has improved and her bowel movements are back to baseline.  Does report  abdominal bloating that is new.  No nausea or vomiting.  No family history of colon cancer.  No prior EGD or colonoscopy.  Drinks about 2 cups of coffee a day with sweetener.  Does consume dairy products.  Drinks tea as well.  Is also seeing allergy next week due to more frequent hives recently  Denies greasy stools or fat floating on top  Past Medical History:  Diagnosis Date  . Allergy   . History of chicken pox   . Hx: UTI (urinary tract infection)     Past Surgical History:  Procedure Laterality Date  . WISDOM TOOTH EXTRACTION      Prior to Admission medications   Medication Sig Start Date End Date Taking? Authorizing Provider  Loratadine (CLARITIN PO) Take by mouth.   Yes [provider]  Pseudoephedrine-guaiFENesin (MUCINEX D PO) Take by mouth.   Yes [provider]  sertraline (ZOLOFT) 50 MG tablet Take 1 1/2 tablet q day 11/12/19  Yes Einar Pheasant, MD    Family History  Problem Relation Age of Onset  . Hyperlipidemia Mother   . Hypertension Father   . Cancer Maternal Grandmother        breast  . Breast cancer Maternal Grandmother 31  . Hyperlipidemia Maternal Grandfather   . Heart disease Maternal Grandfather   . Hypertension Maternal Grandfather   . Stroke Paternal Grandmother   . Breast cancer Paternal Aunt      Social History   Tobacco Use  .  Smoking status: Never Smoker  . Smokeless tobacco: Never Used  Substance Use Topics  . Alcohol use: Not Currently    Alcohol/week: 0.0 standard drinks  . Drug use: No    Allergies as of 03/06/2020  . (No Known Allergies)    Review of Systems:    All systems reviewed and negative except where noted in HPI.   Observations/Objective:  Labs: CBC    Component Value Date/Time   WBC 9.8 09/29/2019 1509   RBC 4.23 09/29/2019 1509   HGB 12.7 09/29/2019 1509   HCT 38.8 09/29/2019 1509   PLT 234 09/29/2019 1509   MCV 91.7 09/29/2019 1509   MCH 30.0 09/29/2019 1509   MCHC 32.7 09/29/2019  1509   RDW 12.8 09/29/2019 1509   LYMPHSABS 2,558 09/29/2019 1509   MONOABS 0.6 11/11/2015 0955   EOSABS 98 09/29/2019 1509   BASOSABS 39 09/29/2019 1509   CMP     Component Value Date/Time   NA 137 09/29/2019 1509   K 3.8 09/29/2019 1509   CL 105 09/29/2019 1509   CO2 24 09/29/2019 1509   GLUCOSE 72 09/29/2019 1509   BUN 8 09/29/2019 1509   CREATININE 0.66 09/29/2019 1509   CALCIUM 9.4 09/29/2019 1509   PROT 7.0 09/29/2019 1509   ALBUMIN 4.3 11/11/2015 0955   AST 11 09/29/2019 1509   ALT 10 09/29/2019 1509   ALKPHOS 56 11/11/2015 0955   BILITOT 0.5 09/29/2019 1509    Imaging Studies: No results found.  Assessment and Plan:   Tara Davila is a 39 y.o. y/o female has been referred for diarrhea and abdominal bloating  Assessment and Plan: Patient chronically has 3-4 loose bowel movements a day, with no alarm features present at this time  Infectious process not likely  Since symptoms got better with diet changes, will test for celiac disease  She is seeing allergy next week and I have asked her to discuss skin prick testing for food allergy with them  I have also asked him to avoid lactose for 2 weeks to see if her symptoms get better  Low FODMAP discussed as well  H. pylori breath test ordered as well  If above work-up is unrevealing, can consider endoscopy  Follow Up Instructions:   I discussed the assessment and treatment plan with the patient. The patient was provided an opportunity to ask questions and all were answered. The patient agreed with the plan and demonstrated an understanding of the instructions.   The patient was advised to call back or seek an in-person evaluation if the symptoms worsen or if the condition fails to improve as anticipated.  I provided 18 minutes of face-to-face time via video software during this encounter.  Additional time was spent in reviewing patient's chart, placing orders etc.   Virgel Manifold, MD  Speech  recognition software was used to dictate the above note.

## 2020-03-06 NOTE — Patient Instructions (Signed)
Low-FODMAP Eating Plan  FODMAPs (fermentable oligosaccharides, disaccharides, monosaccharides, and polyols) are sugars that are hard for some people to digest. A low-FODMAP eating plan may help some people who have bowel (intestinal) diseases to manage their symptoms. This meal plan can be complicated to follow. Work with a diet and nutrition specialist (dietitian) to make a low-FODMAP eating plan that is right for you. A dietitian can make sure that you get enough nutrition from this diet. What are tips for following this plan? Reading food labels  Check labels for hidden FODMAPs such as: ? High-fructose syrup. ? Honey. ? Agave. ? Natural fruit flavors. ? Onion or garlic powder.  Choose low-FODMAP foods that contain 3-4 grams of fiber per serving.  Check food labels for serving sizes. Eat only one serving at a time to make sure FODMAP levels stay low. Meal planning  Follow a low-FODMAP eating plan for up to 6 weeks, or as told by your health care provider or dietitian.  To follow the eating plan: 1. Eliminate high-FODMAP foods from your diet completely. 2. Gradually reintroduce high-FODMAP foods into your diet one at a time. Most people should wait a few days after introducing one high-FODMAP food before they introduce the next high-FODMAP food. Your dietitian can recommend how quickly you may reintroduce foods. 3. Keep a daily record of what you eat and drink, and make note of any symptoms that you have after eating. 4. Review your daily record with a dietitian regularly. Your dietitian can help you identify which foods you can eat and which foods you should avoid. General tips  Drink enough fluid each day to keep your urine pale yellow.  Avoid processed foods. These often have added sugar and may be high in FODMAPs.  Avoid most dairy products, whole grains, and sweeteners.  Work with a dietitian to make sure you get enough fiber in your diet. Recommended  foods Grains  Gluten-free grains, such as rice, oats, buckwheat, quinoa, corn, polenta, and millet. Gluten-free pasta, bread, or cereal. Rice noodles. Corn tortillas. Vegetables  Eggplant, zucchini, cucumber, peppers, green beans, Brussels sprouts, bean sprouts, lettuce, arugula, kale, Swiss chard, spinach, collard greens, bok choy, summer squash, potato, and tomato. Limited amounts of corn, carrot, and sweet potato. Green parts of scallions. Fruits  Bananas, oranges, lemons, limes, blueberries, raspberries, strawberries, grapes, cantaloupe, honeydew melon, kiwi, papaya, passion fruit, and pineapple. Limited amounts of dried cranberries, banana chips, and shredded coconut. Dairy  Lactose-free milk, yogurt, and kefir. Lactose-free cottage cheese and ice cream. Non-dairy milks, such as almond, coconut, hemp, and rice milk. Yogurts made of non-dairy milks. Limited amounts of goat cheese, brie, mozzarella, parmesan, swiss, and other hard cheeses. Meats and other protein foods  Unseasoned beef, pork, poultry, or fish. Eggs. Bacon. Tofu (firm) and tempeh. Limited amounts of nuts and seeds, such as almonds, walnuts, brazil nuts, pecans, peanuts, pumpkin seeds, chia seeds, and sunflower seeds. Fats and oils  Butter-free spreads. Vegetable oils, such as olive, canola, and sunflower oil. Seasoning and other foods  Artificial sweeteners with names that do not end in "ol" such as aspartame, saccharine, and stevia. Maple syrup, white table sugar, raw sugar, brown sugar, and molasses. Fresh basil, coriander, parsley, rosemary, and thyme. Beverages  Water and mineral water. Sugar-sweetened soft drinks. Small amounts of orange juice or cranberry juice. Black and green tea. Most dry wines. Coffee. This may not be a complete list of low-FODMAP foods. Talk with your dietitian for more information. Foods to avoid Grains  Wheat,   including kamut, durum, and semolina. Barley and bulgur. Couscous. Wheat-based  cereals. Wheat noodles, bread, crackers, and pastries. Vegetables  Chicory root, artichoke, asparagus, cabbage, snow peas, sugar snap peas, mushrooms, and cauliflower. Onions, garlic, leeks, and the white part of scallions. Fruits  Fresh, dried, and juiced forms of apple, pear, watermelon, peach, plum, cherries, apricots, blackberries, boysenberries, figs, nectarines, and mango. Avocado. Dairy  Milk, yogurt, ice cream, and soft cheese. Cream and sour cream. Milk-based sauces. Custard. Meats and other protein foods  Fried or fatty meat. Sausage. Cashews and pistachios. Soybeans, baked beans, black beans, chickpeas, kidney beans, fava beans, navy beans, lentils, and split peas. Seasoning and other foods  Any sugar-free gum or candy. Foods that contain artificial sweeteners such as sorbitol, mannitol, isomalt, or xylitol. Foods that contain honey, high-fructose corn syrup, or agave. Bouillon, vegetable stock, beef stock, and chicken stock. Garlic and onion powder. Condiments made with onion, such as hummus, chutney, pickles, relish, salad dressing, and salsa. Tomato paste. Beverages  Chicory-based drinks. Coffee substitutes. Chamomile tea. Fennel tea. Sweet or fortified wines such as port or sherry. Diet soft drinks made with isomalt, mannitol, maltitol, sorbitol, or xylitol. Apple, pear, and mango juice. Juices with high-fructose corn syrup. This may not be a complete list of high-FODMAP foods. Talk with your dietitian to discuss what dietary choices are best for you.  Summary  A low-FODMAP eating plan is a short-term diet that eliminates FODMAPs from your diet to help ease symptoms of certain bowel diseases.  The eating plan usually lasts up to 6 weeks. After that, high-FODMAP foods are restarted gradually, one at a time, so you can find out which may be causing symptoms.  A low-FODMAP eating plan can be complicated. It is best to work with a dietitian who has experience with this type of  plan. This information is not intended to replace advice given to you by your health care provider. Make sure you discuss any questions you have with your health care provider. Document Revised: 10/22/2017 Document Reviewed: 07/06/2017 Elsevier Patient Education  2020 Elsevier Inc.  

## 2020-03-08 ENCOUNTER — Telehealth: Payer: Self-pay | Admitting: Gastroenterology

## 2020-03-08 NOTE — Telephone Encounter (Signed)
I called & l./m for pt to call & make a return appointment Aggie Moats, Eddie North, CMA  Brule, Melanie T  Follow up with Dr. Darene Lamer in 2-3 months

## 2020-03-13 ENCOUNTER — Ambulatory Visit: Payer: BC Managed Care – PPO

## 2020-03-14 ENCOUNTER — Other Ambulatory Visit: Payer: Self-pay

## 2020-03-14 ENCOUNTER — Other Ambulatory Visit: Payer: Self-pay | Admitting: Allergy

## 2020-03-14 ENCOUNTER — Ambulatory Visit
Admission: RE | Admit: 2020-03-14 | Discharge: 2020-03-14 | Disposition: A | Discharge status: Home / Self Care | Payer: BC Managed Care – PPO | Source: Ambulatory Visit | Attending: Allergy | Admitting: Allergy

## 2020-03-14 DIAGNOSIS — R05 Cough: Secondary | ICD-10-CM

## 2020-03-14 DIAGNOSIS — R059 Cough, unspecified: Secondary | ICD-10-CM

## 2020-03-20 ENCOUNTER — Ambulatory Visit: Payer: BC Managed Care – PPO | Attending: Internal Medicine

## 2020-03-20 DIAGNOSIS — Z23 Encounter for immunization: Secondary | ICD-10-CM

## 2020-03-20 NOTE — Progress Notes (Signed)
   Covid-19 Vaccination Clinic  Name:  ROSCHELLE BATTA    MRN: [id number] DOB: 08-04-1981  03/20/2020  [name] was observed post Covid-19 immunization for 30 minutes based on pre-vaccination screening without incident. She was provided with Vaccine Information Sheet and instruction to access the V-Safe system.   [name] was instructed to call 911 with any severe reactions post vaccine: Marland Kitchen Difficulty breathing  . Swelling of face and throat  . A fast heartbeat  . A bad rash all over body  . Dizziness and weakness   Immunizations Administered    Name Date Dose VIS Date Route   Pfizer COVID-19 Vaccine 03/20/2020  2:52 PM 0.3 mL 01/17/2019 Intramuscular   Manufacturer: [place]   Lot: [id number]   [place]: [id number]

## 2020-05-14 ENCOUNTER — Ambulatory Visit: Payer: BC Managed Care – PPO | Admitting: Gastroenterology

## 2020-05-20 ENCOUNTER — Other Ambulatory Visit: Payer: Self-pay | Admitting: Internal Medicine

## 2020-06-21 ENCOUNTER — Other Ambulatory Visit: Payer: Self-pay

## 2020-06-24 ENCOUNTER — Encounter: Payer: Self-pay | Admitting: Gastroenterology

## 2020-06-24 ENCOUNTER — Ambulatory Visit: Payer: BC Managed Care – PPO | Admitting: Gastroenterology

## 2020-06-24 ENCOUNTER — Other Ambulatory Visit: Payer: Self-pay

## 2020-06-24 VITALS — BP 111/78 | HR 79 | Temp 97.7°F | Wt 183.4 lb

## 2020-06-24 DIAGNOSIS — R1013 Epigastric pain: Secondary | ICD-10-CM

## 2020-06-24 DIAGNOSIS — R197 Diarrhea, unspecified: Secondary | ICD-10-CM | POA: Diagnosis not present

## 2020-06-24 NOTE — Progress Notes (Signed)
Vonda Antigua, MD [address]  [place]  [place], [place] [address]  Main: [phone]  Fax: [phone]   Primary Care Physician: Einar Pheasant, MD   Chief Complaint  Patient presents with  . dyspepsia    HPI: Tara Davila is a 39 y.o. female here for abdominal pain and diarrhea.  States symptoms are better but continues to have 2-3 bowel movements a day.  Some days are formed, but still has some days of loose bowel movements.  Does report epigastric abdominal pain, dull, 5/10, nonradiating, unrelated to meals.  No weight loss.  H. pylori testing and celiac panel done.  States has seen allergy testing is not allergic to any foods.  Is also reporting seeing stool floating on top of the toilet bowl but does not know if it appears greasy or not  Current Outpatient Medications  Medication Sig Dispense Refill  . albuterol (VENTOLIN HFA) 108 (90 Base) MCG/ACT inhaler Inhale 2 puffs into the lungs as needed.    . cetirizine (ZYRTEC) 10 MG tablet Take 1 tablet by mouth in the morning and at bedtime.    . mometasone (ELOCON) 0.1 % cream Apply 1 application topically as needed.    . montelukast (SINGULAIR) 10 MG tablet Take 1 tablet by mouth daily.    . sertraline (ZOLOFT) 50 MG tablet TAKE 1 TABLET BY MOUTH EVERY DAY 90 tablet 1   No current facility-administered medications for this visit.    Allergies as of 06/24/2020  . (No Known Allergies)    ROS:  General: Negative for anorexia, weight loss, fever, chills, fatigue, weakness. ENT: Negative for hoarseness, difficulty swallowing , nasal congestion. CV: Negative for chest pain, angina, palpitations, dyspnea on exertion, peripheral edema.  Respiratory: Negative for dyspnea at rest, dyspnea on exertion, cough, sputum, wheezing.  GI: See history of present illness. GU:  Negative for dysuria, hematuria, urinary incontinence, urinary frequency, nocturnal urination.  Endo: Negative for unusual weight change.      Physical Examination:   BP 111/78   Pulse 79   Temp 97.7 F (36.5 C) (Oral)   Wt 183 lb 6.4 oz (83.2 kg)   BMI 30.52 kg/m   General: Well-nourished, well-developed in no acute distress.  Eyes: No icterus. Conjunctivae pink. Mouth: Oropharyngeal mucosa moist and pink , no lesions erythema or exudate. Neck: Supple, Trachea midline Abdomen: Bowel sounds are normal, nontender, nondistended, no hepatosplenomegaly or masses, no abdominal bruits or hernia , no rebound or guarding.   Extremities: No lower extremity edema. No clubbing or deformities. Neuro: Alert and oriented x 3.  Grossly intact. Skin: Warm and dry, no jaundice.   Psych: Alert and cooperative, normal mood and affect.   Labs: CMP     Component Value Date/Time   NA 137 09/29/2019 1509   K 3.8 09/29/2019 1509   CL 105 09/29/2019 1509   CO2 24 09/29/2019 1509   GLUCOSE 72 09/29/2019 1509   BUN 8 09/29/2019 1509   CREATININE 0.66 09/29/2019 1509   CALCIUM 9.4 09/29/2019 1509   PROT 7.0 09/29/2019 1509   ALBUMIN 4.3 11/11/2015 0955   AST 11 09/29/2019 1509   ALT 10 09/29/2019 1509   ALKPHOS 56 11/11/2015 0955   BILITOT 0.5 09/29/2019 1509   Lab Results  Component Value Date   WBC 9.8 09/29/2019   HGB 12.7 09/29/2019   HCT 38.8 09/29/2019   MCV 91.7 09/29/2019   PLT 234 09/29/2019    Imaging Studies: No results found.  Assessment and Plan:   Tara Davila is a 39 y.o. y/o female here for follow-up of abdominal pain and diarrhea  Patient encouraged to have lab testing done that was previously ordered including H. pylori, obtain Obtain calprotectin and pancreatic elastase  Check CRP as well  We did offer to proceed with colonoscopy to rule out IBD at this time, but patient would like to start with conservative management and if abnormal, then proceed as needed.  Dr Vonda Antigua

## 2020-07-30 ENCOUNTER — Telehealth: Payer: Self-pay | Admitting: Internal Medicine

## 2020-07-30 NOTE — Telephone Encounter (Signed)
lft vm regarding referral for Allergy.

## 2020-08-01 NOTE — Telephone Encounter (Signed)
Pt called and said that she has already seen Allergy

## 2020-08-01 NOTE — Telephone Encounter (Signed)
Ok. Thanks!

## 2020-08-05 ENCOUNTER — Telehealth: Payer: BC Managed Care – PPO | Admitting: Gastroenterology

## 2020-08-13 ENCOUNTER — Ambulatory Visit (INDEPENDENT_AMBULATORY_CARE_PROVIDER_SITE_OTHER): Payer: BC Managed Care – PPO | Admitting: Internal Medicine

## 2020-08-13 ENCOUNTER — Encounter: Payer: Self-pay | Admitting: Internal Medicine

## 2020-08-13 ENCOUNTER — Other Ambulatory Visit: Payer: Self-pay

## 2020-08-13 VITALS — BP 114/78 | HR 84 | Temp 98.7°F | Resp 16 | Ht 65.0 in | Wt 185.4 lb

## 2020-08-13 DIAGNOSIS — N63 Unspecified lump in unspecified breast: Secondary | ICD-10-CM

## 2020-08-13 DIAGNOSIS — F439 Reaction to severe stress, unspecified: Secondary | ICD-10-CM | POA: Diagnosis not present

## 2020-08-13 DIAGNOSIS — Z23 Encounter for immunization: Secondary | ICD-10-CM

## 2020-08-13 MED ORDER — BUPROPION HCL ER (XL) 150 MG PO TB24: 150.0000 mg | ORAL_TABLET | Freq: Every day | ORAL | 1 refills | Status: DC

## 2020-08-13 NOTE — Progress Notes (Signed)
Patient ID: Tara Davila, female   DOB: 25-Oct-1981, 39 y.o.   MRN: [id number]   Subjective:    Patient ID: Tara Davila, female    DOB: 1981-10-25, 39 y.o.   MRN: [id number]  HPI This visit occurred during the SARS-CoV-2 public health emergency.  Safety protocols were in place, including screening questions prior to the visit, additional usage of staff PPE, and extensive cleaning of exam room while observing appropriate contact time as indicated for disinfecting solutions.  Patient here for a scheduled follow up.  She reports she is doing relatively well.  Was evalauted in 01/2020 with sinusitis, hives and bowel changes.  Treated with azithromycin, claritin and referred to GI and an allergist.  Seeing Dr Bonna Gains for abdominal pain and persistent loose stools.  Undergoing w/up.  Schedule to have labs drawn.  States symptoms are better.  Has seen an allergist and states allergy testing ok.  Discussed increased stress.  She is on zoloft.  Taking one per day.  Feels is helping.  Concern regarding weight gain.  Feels is related to zoloft.  Overall she feels she is handling things relatively well.  Breathing stable.  Due to f/u with Dr Bary Castilla 10/2020.    Past Medical History:  Diagnosis Date  . Allergy   . History of chicken pox   . Hx: UTI (urinary tract infection)    Past Surgical History:  Procedure Laterality Date  . WISDOM TOOTH EXTRACTION     Family History  Problem Relation Age of Onset  . Hyperlipidemia Mother   . Hypertension Father   . Cancer Maternal Grandmother        breast  . Breast cancer Maternal Grandmother 63  . Hyperlipidemia Maternal Grandfather   . Heart disease Maternal Grandfather   . Hypertension Maternal Grandfather   . Stroke Paternal Grandmother   . Breast cancer Paternal Aunt    Social History   Socioeconomic History  . Marital status: Married    Spouse name: Not on file  . Number of children: Not on file  . Years of education: Not on file    . Highest education level: Not on file  Occupational History  . Not on file  Tobacco Use  . Smoking status: Never Smoker  . Smokeless tobacco: Never Used  Substance and Sexual Activity  . Alcohol use: Not Currently    Alcohol/week: 0.0 standard drinks  . Drug use: No  . Sexual activity: Not on file  Other Topics Concern  . Not on file  Social History Narrative   Lives at home   Social Determinants of Health   Financial Resource Strain:   . Difficulty of Paying Living Expenses: Not on file  Food Insecurity:   . Worried About Charity fundraiser in the Last Year: Not on file  . Ran Out of Food in the Last Year: Not on file  Transportation Needs:   . Lack of Transportation (Medical): Not on file  . Lack of Transportation (Non-Medical): Not on file  Physical Activity:   . Days of Exercise per Week: Not on file  . Minutes of Exercise per Session: Not on file  Stress:   . Feeling of Stress : Not on file  Social Connections:   . Frequency of Communication with Friends and Family: Not on file  . Frequency of Social Gatherings with Friends and Family: Not on file  . Attends Religious Services: Not on file  . Active Member of Clubs or Organizations: Not  on file  . Attends Archivist Meetings: Not on file  . Marital Status: Not on file    Outpatient Encounter Medications as of 08/13/2020  Medication Sig  . albuterol (VENTOLIN HFA) 108 (90 Base) MCG/ACT inhaler Inhale 2 puffs into the lungs as needed.  Marland Kitchen buPROPion (WELLBUTRIN XL) 150 MG 24 hr tablet Take 1 tablet (150 mg total) by mouth daily.  . cetirizine (ZYRTEC) 10 MG tablet Take 1 tablet by mouth in the morning and at bedtime.  . mometasone (ELOCON) 0.1 % cream Apply 1 application topically as needed.  . montelukast (SINGULAIR) 10 MG tablet Take 1 tablet by mouth daily.  . sertraline (ZOLOFT) 50 MG tablet TAKE 1 TABLET BY MOUTH EVERY DAY   No facility-administered encounter medications on file as of 08/13/2020.     Review of Systems  Constitutional: Negative for appetite change and unexpected weight change.  HENT: Negative for congestion and sinus pressure.   Respiratory: Negative for cough, chest tightness and shortness of breath.   Cardiovascular: Negative for chest pain, palpitations and leg swelling.  Gastrointestinal: Negative for nausea and vomiting.       Previous abdominal pain.  2-3 stools per day. Not as loose.    Genitourinary: Negative for difficulty urinating and dysuria.  Musculoskeletal: Negative for joint swelling and myalgias.  Skin: Negative for color change and rash.  Neurological: Negative for dizziness, light-headedness and headaches.  Psychiatric/Behavioral: Negative for agitation and dysphoric mood.       Objective:    Physical Exam Vitals reviewed.  Constitutional:      General: She is not in acute distress.    Appearance: Normal appearance.  HENT:     Head: Normocephalic and atraumatic.     Right Ear: External ear normal.     Left Ear: External ear normal.  Eyes:     General: No scleral icterus.       Right eye: No discharge.        Left eye: No discharge.     Conjunctiva/sclera: Conjunctivae normal.  Neck:     Thyroid: No thyromegaly.  Cardiovascular:     Rate and Rhythm: Normal rate and regular rhythm.  Pulmonary:     Effort: No respiratory distress.     Breath sounds: Normal breath sounds. No wheezing.  Abdominal:     General: Bowel sounds are normal.     Palpations: Abdomen is soft.     Tenderness: There is no abdominal tenderness.  Musculoskeletal:        General: No swelling or tenderness.     Cervical back: Neck supple. No tenderness.  Lymphadenopathy:     Cervical: No cervical adenopathy.  Skin:    Findings: No erythema or rash.  Neurological:     Mental Status: She is alert.  Psychiatric:        Mood and Affect: Mood normal.        Behavior: Behavior normal.     BP 114/78   Pulse 84   Temp 98.7 F (37.1 C)   Resp 16   Ht 5\' 5"   (1.651 m)   Wt 185 lb 6.4 oz (84.1 kg)   SpO2 99%   BMI 30.85 kg/m  Wt Readings from Last 3 Encounters:  08/13/20 185 lb 6.4 oz (84.1 kg)  06/24/20 183 lb 6.4 oz (83.2 kg)  01/30/20 166 lb (75.3 kg)     Lab Results  Component Value Date   WBC 9.8 09/29/2019   HGB 12.7 09/29/2019  HCT 38.8 09/29/2019   PLT 234 09/29/2019   GLUCOSE 72 09/29/2019   CHOL 146 09/29/2019   TRIG 102 09/29/2019   HDL 47 (L) 09/29/2019   LDLCALC 80 09/29/2019   ALT 10 09/29/2019   AST 11 09/29/2019   NA 137 09/29/2019   K 3.8 09/29/2019   CL 105 09/29/2019   CREATININE 0.66 09/29/2019   BUN 8 09/29/2019   CO2 24 09/29/2019   TSH 2.15 09/29/2019    DG Chest 2 View  Result Date: 03/15/2020 CLINICAL DATA:  Patient reports cough and SOB x a few months. Reports cough is productive at times. Hx of asthma. Non-smoker. EXAM: CHEST - 2 VIEW COMPARISON:  None. FINDINGS: The heart size and mediastinal contours are within normal limits. There is bilateral peribronchial cuffing. Otherwise lungs are clear. No pneumothorax or pleural effusion. The visualized skeletal structures are unremarkable. IMPRESSION: Bilateral peribronchial cuffing as can be seen with small airways disease (bronchitis or asthma). No other acute finding. Electronically Signed   By: Audie Pinto M.D.   On: 03/15/2020 10:24       Assessment & Plan:   Problem List Items Addressed This Visit    Stress    Increased stress as outlined.  On zoloft.  Will add wellbutrin.  Have her adjust dose of zoloft - 1/2 tablet alternating with one tablet q od and then can continue taper if doing well.  Call with update.  Follow closely.       Breast nodule    Saw Dr Bary Castilla.  Recommended f/u diagnostic mammogram and ultrasound in 10/2020.         Other Visit Diagnoses    Need for immunization against influenza    -  Primary   Relevant Orders   Flu Vaccine QUAD 36+ mos IM (Completed)       Einar Pheasant, MD

## 2020-08-13 NOTE — Patient Instructions (Signed)
Take zoloft 1/2 tablet alternating with one tablet every other day for one week and then continue 1/2 tablet per day.  Call with update.

## 2020-08-18 ENCOUNTER — Encounter: Payer: Self-pay | Admitting: Internal Medicine

## 2020-08-18 DIAGNOSIS — N63 Unspecified lump in unspecified breast: Secondary | ICD-10-CM | POA: Insufficient documentation

## 2020-08-18 NOTE — Assessment & Plan Note (Signed)
Saw Dr Bary Castilla.  Recommended f/u diagnostic mammogram and ultrasound in 10/2020.

## 2020-08-18 NOTE — Assessment & Plan Note (Signed)
Increased stress as outlined.  On zoloft.  Will add wellbutrin.  Have her adjust dose of zoloft - 1/2 tablet alternating with one tablet q od and then can continue taper if doing well.  Call with update.  Follow closely.

## 2020-09-10 ENCOUNTER — Other Ambulatory Visit: Payer: Self-pay | Admitting: Internal Medicine

## 2020-09-23 ENCOUNTER — Encounter: Payer: Self-pay | Admitting: Internal Medicine

## 2020-09-24 ENCOUNTER — Other Ambulatory Visit: Payer: Self-pay | Admitting: General Surgery

## 2020-09-24 DIAGNOSIS — R928 Other abnormal and inconclusive findings on diagnostic imaging of breast: Secondary | ICD-10-CM

## 2020-10-11 ENCOUNTER — Ambulatory Visit: Payer: BC Managed Care – PPO | Admitting: Internal Medicine

## 2020-10-11 ENCOUNTER — Other Ambulatory Visit: Payer: Self-pay

## 2020-10-11 ENCOUNTER — Encounter: Payer: Self-pay | Admitting: Internal Medicine

## 2020-10-11 DIAGNOSIS — R9389 Abnormal findings on diagnostic imaging of other specified body structures: Secondary | ICD-10-CM | POA: Diagnosis not present

## 2020-10-11 DIAGNOSIS — F439 Reaction to severe stress, unspecified: Secondary | ICD-10-CM | POA: Diagnosis not present

## 2020-10-11 DIAGNOSIS — R635 Abnormal weight gain: Secondary | ICD-10-CM

## 2020-10-11 NOTE — Progress Notes (Signed)
Patient ID: Tara Davila, female   DOB: 06-22-1981, 39 y.o.   MRN: [id number]   Subjective:    Patient ID: Tara Davila, female    DOB: 09-Oct-1981, 39 y.o.   MRN: [id number]  HPI This visit occurred during the SARS-CoV-2 public health emergency.  Safety protocols were in place, including screening questions prior to the visit, additional usage of staff PPE, and extensive cleaning of exam room while observing appropriate contact time as indicated for disinfecting solutions.  Patient here for a scheduled followup.  Last visit zoloft was changed to wellbutrin.  She feels she is doing relatively well on the wellbutrin.  Handling stress.  Tries to stay active. Does not do increased formal exercise.  Discussed concern regarding weight gain.  Does watch what she eats.  Discussed low carb diet and exercise.  No chest pain or sob reported.  No abdominal pain or bowel change reported.    Past Medical History:  Diagnosis Date  . Allergy   . History of chicken pox   . Hx: UTI (urinary tract infection)    Past Surgical History:  Procedure Laterality Date  . WISDOM TOOTH EXTRACTION     Family History  Problem Relation Age of Onset  . Hyperlipidemia Mother   . Hypertension Father   . Cancer Maternal Grandmother        breast  . Breast cancer Maternal Grandmother 27  . Hyperlipidemia Maternal Grandfather   . Heart disease Maternal Grandfather   . Hypertension Maternal Grandfather   . Stroke Paternal Grandmother   . Breast cancer Paternal Aunt    Social History   Socioeconomic History  . Marital status: Married    Spouse name: Not on file  . Number of children: Not on file  . Years of education: Not on file  . Highest education level: Not on file  Occupational History  . Not on file  Tobacco Use  . Smoking status: Never Smoker  . Smokeless tobacco: Never Used  Substance and Sexual Activity  . Alcohol use: Not Currently    Alcohol/week: 0.0 standard drinks  . Drug use: No    . Sexual activity: Not on file  Other Topics Concern  . Not on file  Social History Narrative   Lives at home   Social Determinants of Health   Financial Resource Strain:   . Difficulty of Paying Living Expenses: Not on file  Food Insecurity:   . Worried About Charity fundraiser in the Last Year: Not on file  . Ran Out of Food in the Last Year: Not on file  Transportation Needs:   . Lack of Transportation (Medical): Not on file  . Lack of Transportation (Non-Medical): Not on file  Physical Activity:   . Days of Exercise per Week: Not on file  . Minutes of Exercise per Session: Not on file  Stress:   . Feeling of Stress : Not on file  Social Connections:   . Frequency of Communication with Friends and Family: Not on file  . Frequency of Social Gatherings with Friends and Family: Not on file  . Attends Religious Services: Not on file  . Active Member of Clubs or Organizations: Not on file  . Attends Archivist Meetings: Not on file  . Marital Status: Not on file    Outpatient Encounter Medications as of 10/11/2020  Medication Sig  . albuterol (VENTOLIN HFA) 108 (90 Base) MCG/ACT inhaler Inhale 2 puffs into the lungs as needed.  Marland Kitchen  buPROPion (WELLBUTRIN XL) 150 MG 24 hr tablet TAKE 1 TABLET BY MOUTH EVERY DAY  . cetirizine (ZYRTEC) 10 MG tablet Take 1 tablet by mouth in the morning and at bedtime.  . mometasone (ELOCON) 0.1 % cream Apply 1 application topically as needed.  . montelukast (SINGULAIR) 10 MG tablet Take 1 tablet by mouth daily.  . [DISCONTINUED] sertraline (ZOLOFT) 50 MG tablet TAKE 1 TABLET BY MOUTH EVERY DAY   No facility-administered encounter medications on file as of 10/11/2020.    Review of Systems  Constitutional: Negative for appetite change and unexpected weight change.  HENT: Negative for congestion and sinus pressure.   Respiratory: Negative for cough, chest tightness and shortness of breath.   Cardiovascular: Negative for chest pain,  palpitations and leg swelling.  Gastrointestinal: Negative for abdominal pain, diarrhea, nausea and vomiting.  Genitourinary: Negative for difficulty urinating and dysuria.  Musculoskeletal: Negative for joint swelling and myalgias.  Skin: Negative for color change and rash.  Neurological: Negative for dizziness, light-headedness and headaches.  Psychiatric/Behavioral: Negative for agitation and dysphoric mood.       Objective:    Physical Exam Vitals reviewed.  Constitutional:      General: She is not in acute distress.    Appearance: Normal appearance.  HENT:     Head: Normocephalic and atraumatic.     Right Ear: External ear normal.     Left Ear: External ear normal.  Eyes:     General: No scleral icterus.       Right eye: No discharge.        Left eye: No discharge.     Conjunctiva/sclera: Conjunctivae normal.  Neck:     Thyroid: No thyromegaly.  Cardiovascular:     Rate and Rhythm: Normal rate and regular rhythm.  Pulmonary:     Effort: No respiratory distress.     Breath sounds: Normal breath sounds. No wheezing.  Abdominal:     General: Bowel sounds are normal.     Palpations: Abdomen is soft.     Tenderness: There is no abdominal tenderness.  Musculoskeletal:        General: No swelling or tenderness.     Cervical back: Neck supple. No tenderness.  Lymphadenopathy:     Cervical: No cervical adenopathy.  Skin:    Findings: No erythema or rash.  Neurological:     Mental Status: She is alert.  Psychiatric:        Mood and Affect: Mood normal.        Behavior: Behavior normal.     BP 112/70   Pulse 100   Temp 98.7 F (37.1 C) (Oral)   Resp 16   Ht 5\' 5"  (1.651 m)   Wt 189 lb (85.7 kg)   SpO2 99%   BMI 31.45 kg/m  Wt Readings from Last 3 Encounters:  10/11/20 189 lb (85.7 kg)  08/13/20 185 lb 6.4 oz (84.1 kg)  06/24/20 183 lb 6.4 oz (83.2 kg)     Lab Results  Component Value Date   WBC 9.8 09/29/2019   HGB 12.7 09/29/2019   HCT 38.8  09/29/2019   PLT 234 09/29/2019   GLUCOSE 72 09/29/2019   CHOL 146 09/29/2019   TRIG 102 09/29/2019   HDL 47 (L) 09/29/2019   LDLCALC 80 09/29/2019   ALT 10 09/29/2019   AST 11 09/29/2019   NA 137 09/29/2019   K 3.8 09/29/2019   CL 105 09/29/2019   CREATININE 0.66 09/29/2019   BUN 8  09/29/2019   CO2 24 09/29/2019   TSH 2.15 09/29/2019    DG Chest 2 View  Result Date: 03/15/2020 CLINICAL DATA:  Patient reports cough and SOB x a few months. Reports cough is productive at times. Hx of asthma. Non-smoker. EXAM: CHEST - 2 VIEW COMPARISON:  None. FINDINGS: The heart size and mediastinal contours are within normal limits. There is bilateral peribronchial cuffing. Otherwise lungs are clear. No pneumothorax or pleural effusion. The visualized skeletal structures are unremarkable. IMPRESSION: Bilateral peribronchial cuffing as can be seen with small airways disease (bronchitis or asthma). No other acute finding. Electronically Signed   By: Audie Pinto M.D.   On: 03/15/2020 10:24       Assessment & Plan:   Problem List Items Addressed This Visit    Weight gain    Discussed with her today.  Discussed diet and exercise.  Information given.  Follow.        Stress    History of increased stress. Previously on zoloft.  Changed to wellbutrin.  wellbutrin controlling symptoms.  Handling stress relatively well.  Continue wellbutrin.  Follow.        Abnormal CXR    Abnormal cxr - previous infection.  Recheck cxr to confirm clear.            Einar Pheasant, MD

## 2020-10-12 ENCOUNTER — Encounter: Payer: Self-pay | Admitting: Internal Medicine

## 2020-10-12 ENCOUNTER — Telehealth: Payer: Self-pay | Admitting: Internal Medicine

## 2020-10-12 DIAGNOSIS — R635 Abnormal weight gain: Secondary | ICD-10-CM | POA: Insufficient documentation

## 2020-10-12 DIAGNOSIS — R9389 Abnormal findings on diagnostic imaging of other specified body structures: Secondary | ICD-10-CM

## 2020-10-12 NOTE — Assessment & Plan Note (Signed)
History of increased stress. Previously on zoloft.  Changed to wellbutrin.  wellbutrin controlling symptoms.  Handling stress relatively well.  Continue wellbutrin.  Follow.

## 2020-10-12 NOTE — Assessment & Plan Note (Signed)
Abnormal cxr - previous infection.  Recheck cxr to confirm clear.

## 2020-10-12 NOTE — Assessment & Plan Note (Signed)
Discussed with her today.  Discussed diet and exercise.  Information given.  Follow.

## 2020-10-12 NOTE — Telephone Encounter (Signed)
Needs a f/u cxr to confirm clear.  If she is agreeable to return for cxr, need to schedule.

## 2020-10-14 NOTE — Telephone Encounter (Signed)
Cxr scheduled

## 2020-10-24 ENCOUNTER — Ambulatory Visit (INDEPENDENT_AMBULATORY_CARE_PROVIDER_SITE_OTHER): Payer: BC Managed Care – PPO

## 2020-10-24 ENCOUNTER — Other Ambulatory Visit: Payer: Self-pay

## 2020-10-24 ENCOUNTER — Other Ambulatory Visit: Payer: BC Managed Care – PPO

## 2020-10-24 DIAGNOSIS — R9389 Abnormal findings on diagnostic imaging of other specified body structures: Secondary | ICD-10-CM | POA: Diagnosis not present

## 2020-11-08 ENCOUNTER — Ambulatory Visit
Admission: RE | Admit: 2020-11-08 | Discharge: 2020-11-08 | Disposition: A | Discharge status: Home / Self Care | Payer: BC Managed Care – PPO | Source: Ambulatory Visit | Attending: General Surgery | Admitting: General Surgery

## 2020-11-08 ENCOUNTER — Other Ambulatory Visit: Payer: Self-pay

## 2020-11-08 DIAGNOSIS — R928 Other abnormal and inconclusive findings on diagnostic imaging of breast: Secondary | ICD-10-CM | POA: Diagnosis not present

## 2021-01-27 ENCOUNTER — Encounter: Payer: BC Managed Care – PPO | Admitting: Internal Medicine

## 2021-03-14 ENCOUNTER — Encounter: Payer: BC Managed Care – PPO | Admitting: Internal Medicine

## 2021-03-25 ENCOUNTER — Other Ambulatory Visit: Payer: Self-pay | Admitting: Internal Medicine

## 2021-04-10 ENCOUNTER — Encounter: Payer: Self-pay | Admitting: Internal Medicine

## 2021-04-17 ENCOUNTER — Other Ambulatory Visit: Payer: Self-pay

## 2021-04-17 ENCOUNTER — Ambulatory Visit (INDEPENDENT_AMBULATORY_CARE_PROVIDER_SITE_OTHER): Payer: BC Managed Care – PPO | Admitting: Internal Medicine

## 2021-04-17 VITALS — BP 118/80 | HR 92 | Temp 97.8°F | Resp 16 | Ht 65.0 in | Wt 180.0 lb

## 2021-04-17 DIAGNOSIS — F439 Reaction to severe stress, unspecified: Secondary | ICD-10-CM | POA: Diagnosis not present

## 2021-04-17 DIAGNOSIS — R9389 Abnormal findings on diagnostic imaging of other specified body structures: Secondary | ICD-10-CM | POA: Diagnosis not present

## 2021-04-17 DIAGNOSIS — Z0000 Encounter for general adult medical examination without abnormal findings: Secondary | ICD-10-CM

## 2021-04-17 DIAGNOSIS — Z6829 Body mass index (BMI) 29.0-29.9, adult: Secondary | ICD-10-CM

## 2021-04-17 NOTE — Assessment & Plan Note (Addendum)
Physical today 04/17/21.  PAP 07/2019 - negative with negative HPV ([facility]).  Getting pap smears here now.  Mammogram 12.17/21 - [facility].  Recommended f/u diagnostic mammogram with possible ultrasound in one year.

## 2021-04-17 NOTE — Progress Notes (Signed)
Patient ID: Tara Davila, female   DOB: 01/20/1981, 40 y.o.   MRN: [id number]   Subjective:    Patient ID: Tara Davila, female    DOB: Jan 24, 1981, 40 y.o.   MRN: [id number]  HPI This visit occurred during the SARS-CoV-2 public health emergency.  Safety protocols were in place, including screening questions prior to the visit, additional usage of staff PPE, and extensive cleaning of exam room while observing appropriate contact time as indicated for disinfecting solutions.  Patient here for a complete physical exam.  She is walking.  Trying to stay active.  Has adjusted her diet.  Changed protein shakes.  Has lost weight.  No chest pain or sob reported.  No acid reflux or abdominal pain reported.  Handling stress.  No bowel problems reported.  Does report fatigue.    Past Medical History:  Diagnosis Date  . Allergy   . History of chicken pox   . Hx: UTI (urinary tract infection)    Past Surgical History:  Procedure Laterality Date  . WISDOM TOOTH EXTRACTION     Family History  Problem Relation Age of Onset  . Hyperlipidemia Mother   . Hypertension Father   . Cancer Maternal Grandmother        breast  . Breast cancer Maternal Grandmother 35  . Hyperlipidemia Maternal Grandfather   . Heart disease Maternal Grandfather   . Hypertension Maternal Grandfather   . Stroke Paternal Grandmother   . Breast cancer Paternal Aunt    Social History   Socioeconomic History  . Marital status: Married    Spouse name: Not on file  . Number of children: Not on file  . Years of education: Not on file  . Highest education level: Not on file  Occupational History  . Not on file  Tobacco Use  . Smoking status: Never Smoker  . Smokeless tobacco: Never Used  Substance and Sexual Activity  . Alcohol use: Not Currently    Alcohol/week: 0.0 standard drinks  . Drug use: No  . Sexual activity: Not on file  Other Topics Concern  . Not on file  Social History Narrative   Lives at  home   Social Determinants of Health   Financial Resource Strain: Not on file  Food Insecurity: Not on file  Transportation Needs: Not on file  Physical Activity: Not on file  Stress: Not on file  Social Connections: Not on file    Outpatient Encounter Medications as of 04/17/2021  Medication Sig  . albuterol (VENTOLIN HFA) 108 (90 Base) MCG/ACT inhaler Inhale 2 puffs into the lungs as needed.  Marland Kitchen buPROPion (WELLBUTRIN XL) 150 MG 24 hr tablet TAKE 1 TABLET BY MOUTH EVERY DAY  . cetirizine (ZYRTEC) 10 MG tablet Take 1 tablet by mouth in the morning and at bedtime.  . mometasone (ELOCON) 0.1 % cream Apply 1 application topically as needed.  . montelukast (SINGULAIR) 10 MG tablet Take 1 tablet by mouth daily.   No facility-administered encounter medications on file as of 04/17/2021.    Review of Systems  Constitutional: Positive for fatigue. Negative for appetite change and unexpected weight change.  HENT: Negative for congestion, sinus pressure and sore throat.   Eyes: Negative for pain and visual disturbance.  Respiratory: Negative for cough, chest tightness and shortness of breath.   Cardiovascular: Negative for chest pain, palpitations and leg swelling.  Gastrointestinal: Negative for abdominal pain, diarrhea, nausea and vomiting.  Genitourinary: Negative for difficulty urinating and dysuria.  Musculoskeletal: Negative  for joint swelling and myalgias.  Skin: Negative for color change and rash.  Neurological: Negative for dizziness, light-headedness and headaches.  Hematological: Negative for adenopathy. Does not bruise/bleed easily.  Psychiatric/Behavioral: Negative for agitation and dysphoric mood.       Objective:    Physical Exam Vitals reviewed.  Constitutional:      General: She is not in acute distress.    Appearance: Normal appearance. She is well-developed.  HENT:     Head: Normocephalic and atraumatic.     Right Ear: External ear normal.     Left Ear: External  ear normal.  Eyes:     General: No scleral icterus.       Right eye: No discharge.        Left eye: No discharge.     Conjunctiva/sclera: Conjunctivae normal.  Neck:     Thyroid: No thyromegaly.  Cardiovascular:     Rate and Rhythm: Normal rate and regular rhythm.  Pulmonary:     Effort: No tachypnea, accessory muscle usage or respiratory distress.     Breath sounds: Normal breath sounds. No decreased breath sounds or wheezing.  Chest:  Breasts:     Right: No inverted nipple, mass, nipple discharge or tenderness (no axillary adenopathy).     Left: No inverted nipple, mass, nipple discharge or tenderness (no axilarry adenopathy).    Abdominal:     General: Bowel sounds are normal.     Palpations: Abdomen is soft.     Tenderness: There is no abdominal tenderness.  Musculoskeletal:        General: No swelling or tenderness.     Cervical back: Neck supple. No tenderness.  Lymphadenopathy:     Cervical: No cervical adenopathy.  Skin:    Findings: No erythema or rash.  Neurological:     Mental Status: She is alert and oriented to person, place, and time.  Psychiatric:        Mood and Affect: Mood normal.        Behavior: Behavior normal.     BP 118/80   Pulse 92   Temp 97.8 F (36.6 C)   Resp 16   Ht 5\' 5"  (1.651 m)   Wt 180 lb (81.6 kg)   SpO2 99%   BMI 29.95 kg/m  Wt Readings from Last 3 Encounters:  04/17/21 180 lb (81.6 kg)  10/11/20 189 lb (85.7 kg)  08/13/20 185 lb 6.4 oz (84.1 kg)     Lab Results  Component Value Date   WBC 9.8 09/29/2019   HGB 12.7 09/29/2019   HCT 38.8 09/29/2019   PLT 234 09/29/2019   GLUCOSE 72 09/29/2019   CHOL 146 09/29/2019   TRIG 102 09/29/2019   HDL 47 (L) 09/29/2019   LDLCALC 80 09/29/2019   ALT 10 09/29/2019   AST 11 09/29/2019   NA 137 09/29/2019   K 3.8 09/29/2019   CL 105 09/29/2019   CREATININE 0.66 09/29/2019   BUN 8 09/29/2019   CO2 24 09/29/2019   TSH 2.15 09/29/2019    US BREAST LTD UNI LEFT INC  AXILLA  Result Date: 11/08/2020 CLINICAL DATA:  40 year old female presenting for follow-up of bilateral breast masses. EXAM: DIGITAL DIAGNOSTIC BILATERAL MAMMOGRAM WITH TOMO AND CAD; ULTRASOUND LEFT BREAST LIMITED; ULTRASOUND RIGHT BREAST LIMITED COMPARISON:  Previous exam(s). ACR Breast Density Category c: The breast tissue is heterogeneously dense, which may obscure small masses. FINDINGS: The masses in the lower-inner right breast and the upper outer left breast are mammographically  stable. No suspicious calcifications, masses or areas of distortion are seen in the bilateral breasts. Mammographic images were processed with CAD. Ultrasound of the right breast at 4 o'clock, 5 cm from the nipple demonstrates a stable oval hypoechoic mass measuring 0.7 x 0.5 x 0.6 cm, previously measuring 0.7 x 0.4 x 0.7 cm. Ultrasound of the left breast at 2 o'clock, 4 cm from the nipple demonstrates a stable oval hypoechoic circumscribed mass measuring 1.4 x 0.7 x 1.1 cm, previously measuring 1.4 x 0.7 x 1.2 cm. IMPRESSION: 1.  The likely benign bilateral breast masses are stable. 2.  No mammographic evidence of malignancy in the bilateral breasts. RECOMMENDATION: Bilateral diagnostic mammogram and bilateral ultrasound recommended in 1 year. I have discussed the findings and recommendations with the patient. If applicable, a reminder letter will be sent to the patient regarding the next appointment. BI-RADS CATEGORY  3: Probably benign. Electronically Signed   By: Ammie Ferrier M.D.   On: 11/08/2020 12:02   US BREAST LTD UNI RIGHT INC AXILLA  Result Date: 11/08/2020 CLINICAL DATA:  40 year old female presenting for follow-up of bilateral breast masses. EXAM: DIGITAL DIAGNOSTIC BILATERAL MAMMOGRAM WITH TOMO AND CAD; ULTRASOUND LEFT BREAST LIMITED; ULTRASOUND RIGHT BREAST LIMITED COMPARISON:  Previous exam(s). ACR Breast Density Category c: The breast tissue is heterogeneously dense, which may obscure small masses.  FINDINGS: The masses in the lower-inner right breast and the upper outer left breast are mammographically stable. No suspicious calcifications, masses or areas of distortion are seen in the bilateral breasts. Mammographic images were processed with CAD. Ultrasound of the right breast at 4 o'clock, 5 cm from the nipple demonstrates a stable oval hypoechoic mass measuring 0.7 x 0.5 x 0.6 cm, previously measuring 0.7 x 0.4 x 0.7 cm. Ultrasound of the left breast at 2 o'clock, 4 cm from the nipple demonstrates a stable oval hypoechoic circumscribed mass measuring 1.4 x 0.7 x 1.1 cm, previously measuring 1.4 x 0.7 x 1.2 cm. IMPRESSION: 1.  The likely benign bilateral breast masses are stable. 2.  No mammographic evidence of malignancy in the bilateral breasts. RECOMMENDATION: Bilateral diagnostic mammogram and bilateral ultrasound recommended in 1 year. I have discussed the findings and recommendations with the patient. If applicable, a reminder letter will be sent to the patient regarding the next appointment. BI-RADS CATEGORY  3: Probably benign. Electronically Signed   By: Ammie Ferrier M.D.   On: 11/08/2020 12:02   MM DIAG BREAST TOMO BILATERAL  Result Date: 11/08/2020 CLINICAL DATA:  40 year old female presenting for follow-up of bilateral breast masses. EXAM: DIGITAL DIAGNOSTIC BILATERAL MAMMOGRAM WITH TOMO AND CAD; ULTRASOUND LEFT BREAST LIMITED; ULTRASOUND RIGHT BREAST LIMITED COMPARISON:  Previous exam(s). ACR Breast Density Category c: The breast tissue is heterogeneously dense, which may obscure small masses. FINDINGS: The masses in the lower-inner right breast and the upper outer left breast are mammographically stable. No suspicious calcifications, masses or areas of distortion are seen in the bilateral breasts. Mammographic images were processed with CAD. Ultrasound of the right breast at 4 o'clock, 5 cm from the nipple demonstrates a stable oval hypoechoic mass measuring 0.7 x 0.5 x 0.6 cm,  previously measuring 0.7 x 0.4 x 0.7 cm. Ultrasound of the left breast at 2 o'clock, 4 cm from the nipple demonstrates a stable oval hypoechoic circumscribed mass measuring 1.4 x 0.7 x 1.1 cm, previously measuring 1.4 x 0.7 x 1.2 cm. IMPRESSION: 1.  The likely benign bilateral breast masses are stable. 2.  No mammographic evidence of malignancy  in the bilateral breasts. RECOMMENDATION: Bilateral diagnostic mammogram and bilateral ultrasound recommended in 1 year. I have discussed the findings and recommendations with the patient. If applicable, a reminder letter will be sent to the patient regarding the next appointment. BI-RADS CATEGORY  3: Probably benign. Electronically Signed   By: Ammie Ferrier M.D.   On: 11/08/2020 12:02       Assessment & Plan:   Problem List Items Addressed This Visit    Abnormal CXR    F/u cxr 10/24/20 - clear.        BMI 29.0-29.9,adult    Walking at lunch.  Watching diet.  Has lost weight.  Follow.       Health care maintenance    Physical today 04/17/21.  PAP 07/2019 - negative with negative HPV ([facility]).  Getting pap smears here now.  Mammogram 12.17/21 - [facility].  Recommended f/u diagnostic mammogram with possible ultrasound in one year.        Stress    Appears to be handling things relatively well.  On wellbutrin.  Will continue.  Follow.         Other Visit Diagnoses    Routine general medical examination at a health care facility    -  Primary       Einar Pheasant, MD

## 2021-04-21 ENCOUNTER — Encounter: Payer: Self-pay | Admitting: Internal Medicine

## 2021-04-21 DIAGNOSIS — Z6829 Body mass index (BMI) 29.0-29.9, adult: Secondary | ICD-10-CM | POA: Insufficient documentation

## 2021-04-21 NOTE — Assessment & Plan Note (Signed)
F/u cxr 10/24/20 - clear.

## 2021-04-21 NOTE — Assessment & Plan Note (Signed)
Appears to be handling things relatively well.  On wellbutrin.  Will continue.  Follow.

## 2021-04-21 NOTE — Assessment & Plan Note (Signed)
Walking at lunch.  Watching diet.  Has lost weight.  Follow.

## 2021-04-23 IMAGING — MG DIGITAL SCREENING BILAT W/ TOMO W/ CAD
8 series · 8 of 24 positions shown · non-contrast
Comparison: Baseline

CLINICAL DATA: Screening.

EXAM:
DIGITAL SCREENING BILATERAL MAMMOGRAM WITH TOMO AND CAD

[L CC synth-2D]
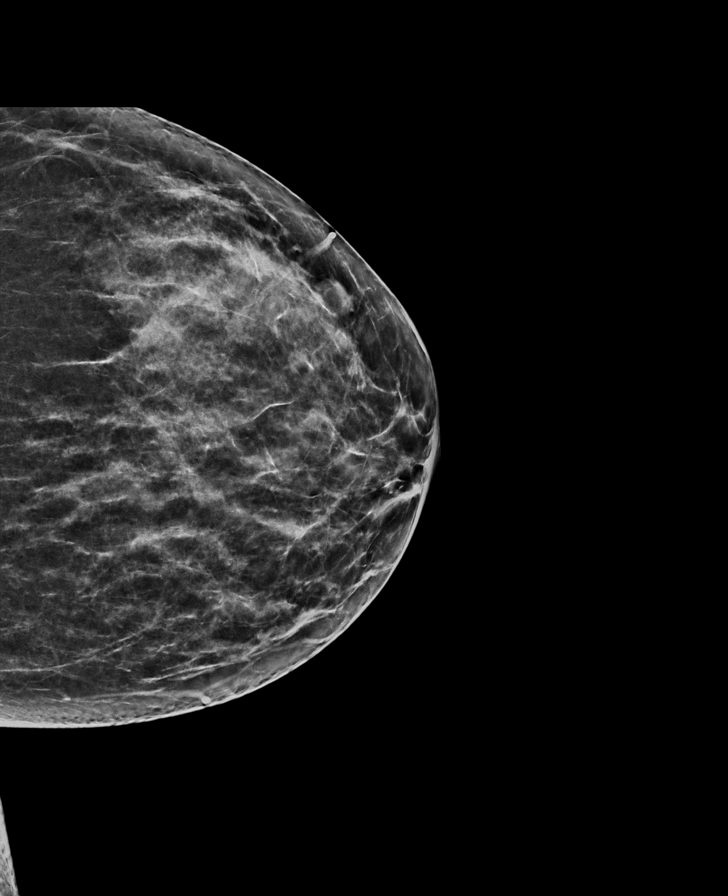

[R CC synth-2D]
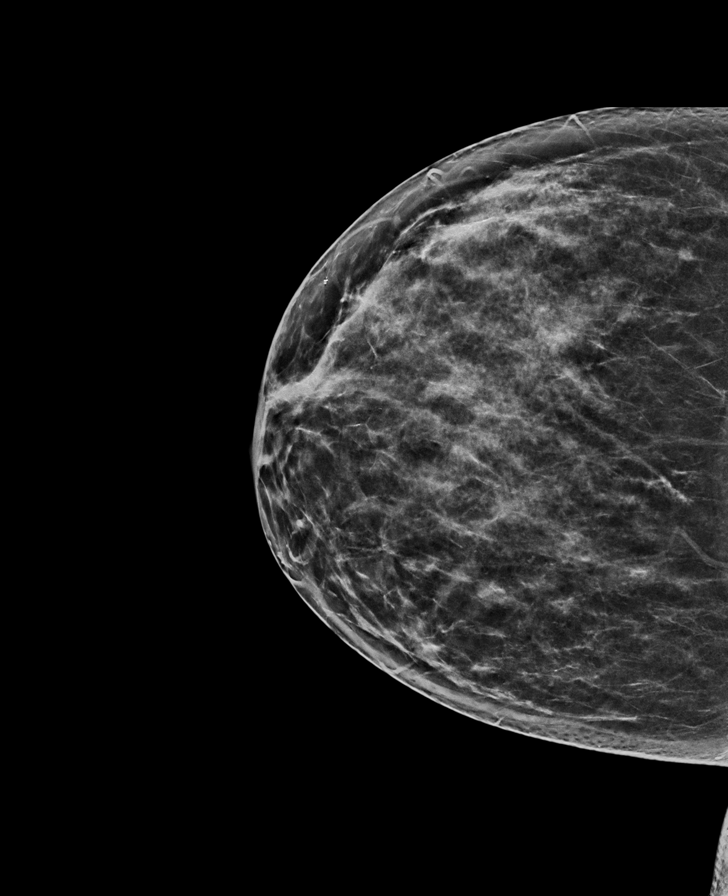

[R MLO synth-2D]
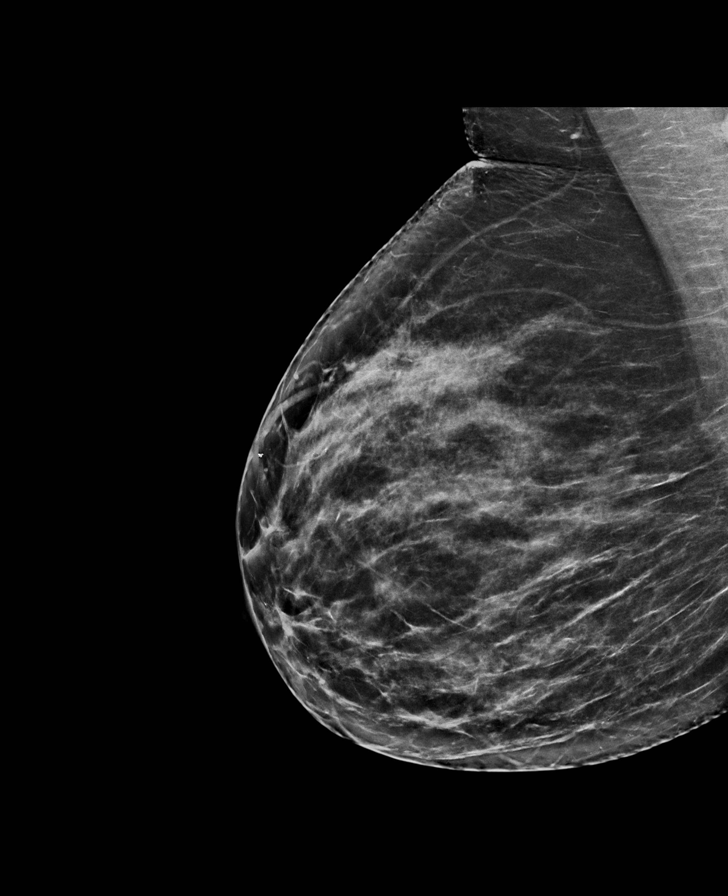

[L MLO synth-2D]
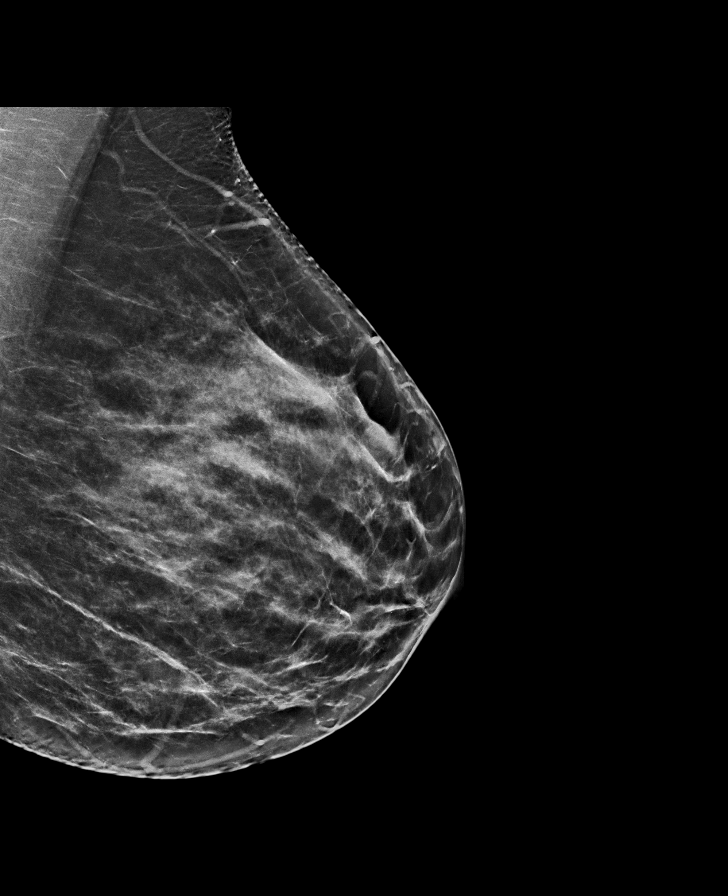

[L MLO tomo · tomo slice 35/70.0]
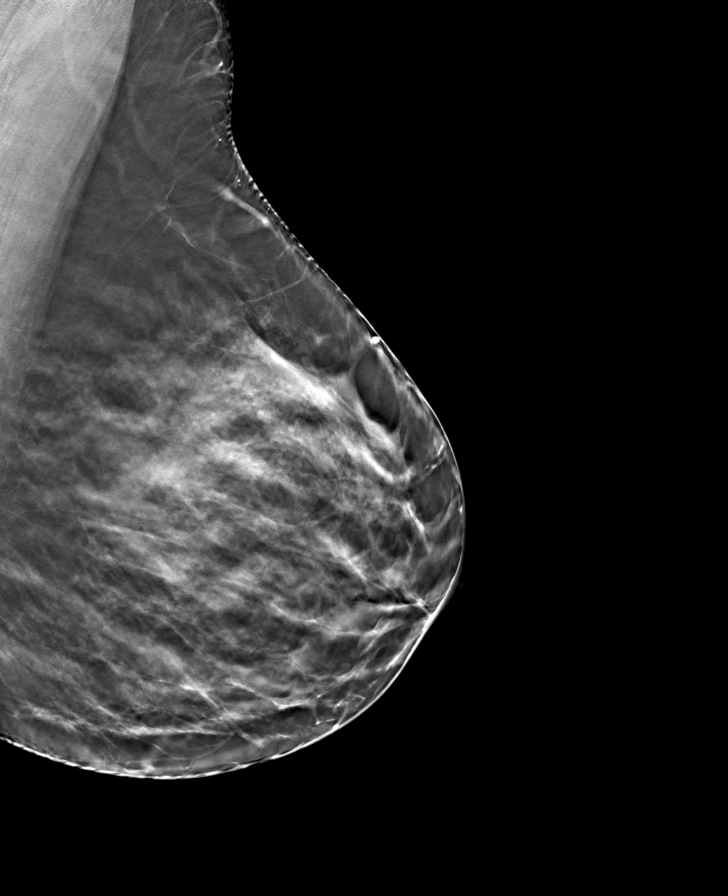

[R MLO tomo · tomo slice 39/77.0]
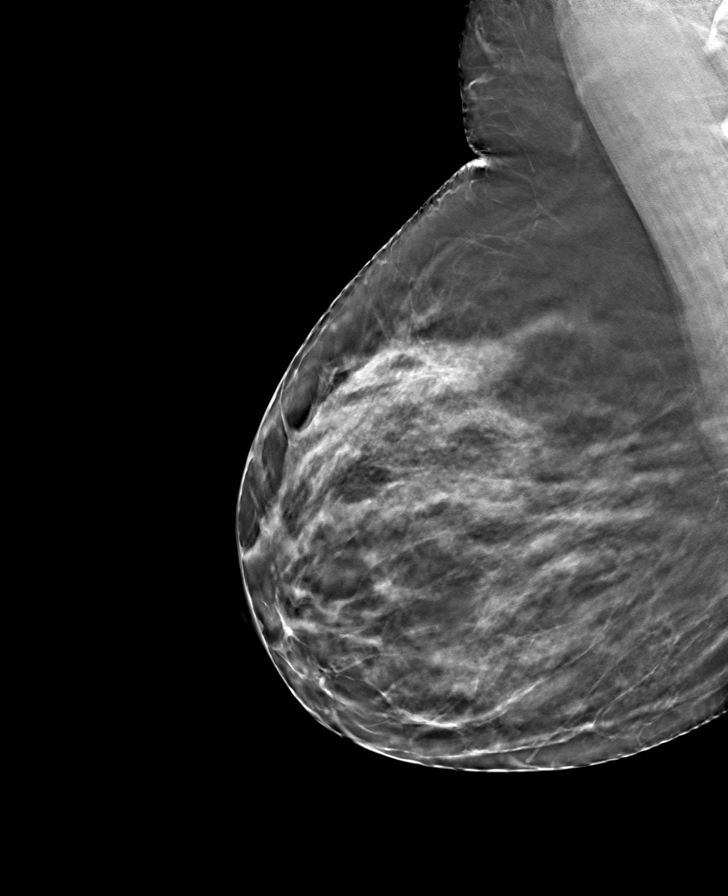

[R CC tomo · tomo slice 34/67.0]
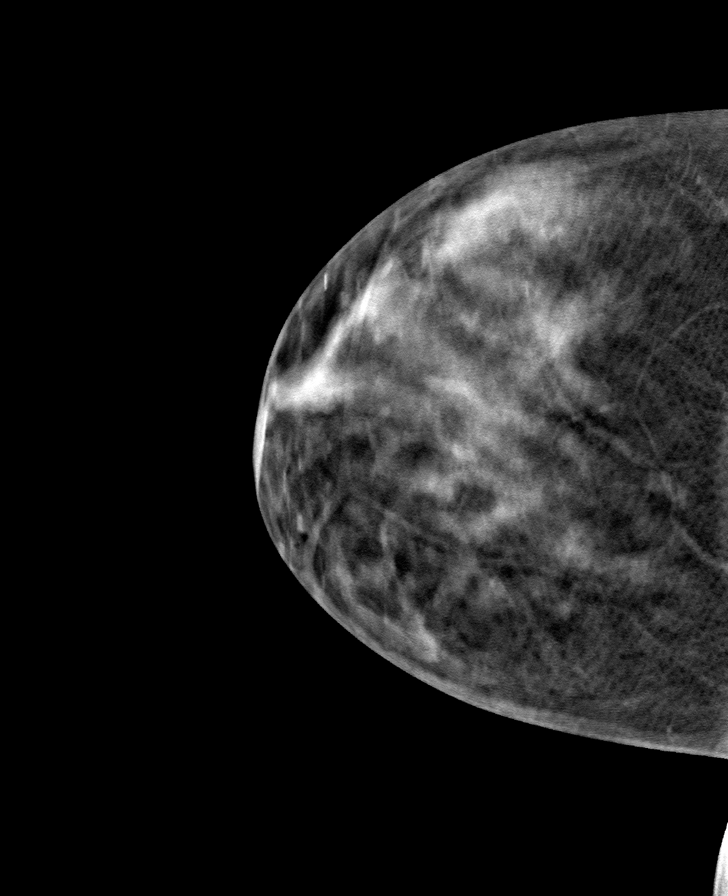

[L CC tomo · tomo slice 33/65.0]
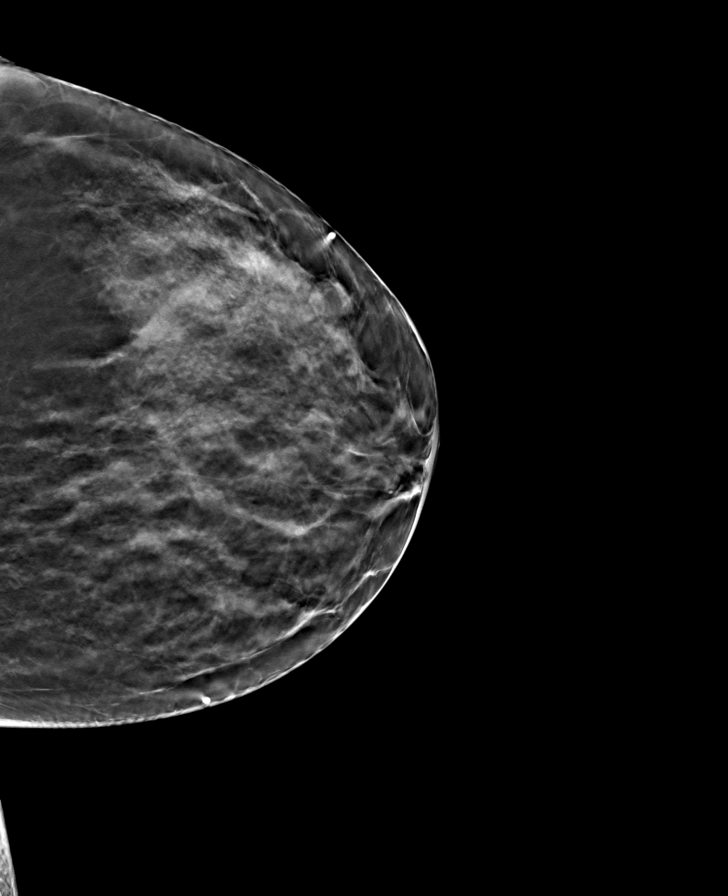

[8 of 24 positions shown; findings below may reference images not displayed]

ACR Breast Density Category c: The breast tissue is heterogeneously
dense, which may obscure small masses.
FINDINGS: In the right breast a possible mass requires further evaluation.

In the left breast a possible mass requires further evaluation.

Images were processed with CAD.
IMPRESSION: Further evaluation is suggested for possible mass in the right
breast.

Further evaluation is suggested for possible mass in the left
breast.

RECOMMENDATION:
Diagnostic mammogram and possibly ultrasound of both breasts.
(Code:[id number])

The patient will be contacted regarding the findings, and additional
imaging will be scheduled.

BI-RADS CATEGORY  0: Incomplete. Need additional imaging evaluation
and/or prior mammograms for comparison.

## 2021-08-05 ENCOUNTER — Ambulatory Visit: Payer: BC Managed Care – PPO | Admitting: Internal Medicine

## 2021-08-05 ENCOUNTER — Telehealth: Payer: Self-pay | Admitting: Internal Medicine

## 2021-08-05 ENCOUNTER — Other Ambulatory Visit: Payer: Self-pay

## 2021-08-05 VITALS — BP 118/72 | HR 89 | Temp 98.0°F | Resp 16 | Ht 65.0 in | Wt 182.8 lb

## 2021-08-05 DIAGNOSIS — Z1231 Encounter for screening mammogram for malignant neoplasm of breast: Secondary | ICD-10-CM

## 2021-08-05 DIAGNOSIS — F439 Reaction to severe stress, unspecified: Secondary | ICD-10-CM | POA: Diagnosis not present

## 2021-08-05 DIAGNOSIS — Z13220 Encounter for screening for lipoid disorders: Secondary | ICD-10-CM

## 2021-08-05 DIAGNOSIS — R928 Other abnormal and inconclusive findings on diagnostic imaging of breast: Secondary | ICD-10-CM | POA: Diagnosis not present

## 2021-08-05 DIAGNOSIS — M79676 Pain in unspecified toe(s): Secondary | ICD-10-CM

## 2021-08-05 DIAGNOSIS — Z23 Encounter for immunization: Secondary | ICD-10-CM | POA: Diagnosis not present

## 2021-08-05 LAB — CBC WITH DIFFERENTIAL/PLATELET
Basophils Absolute: 0.1 10*3/uL (ref 0.0–0.1)
Basophils Relative: 0.5 % (ref 0.0–3.0)
Eosinophils Absolute: 0.2 10*3/uL (ref 0.0–0.7)
Eosinophils Relative: 1.6 % (ref 0.0–5.0)
HCT: 38.2 % (ref 36.0–46.0)
Hemoglobin: 12.5 g/dL (ref 12.0–15.0)
Lymphocytes Relative: 25.4 % (ref 12.0–46.0)
Lymphs Abs: 2.4 10*3/uL (ref 0.7–4.0)
MCHC: 32.7 g/dL (ref 30.0–36.0)
MCV: 90.9 fl (ref 78.0–100.0)
Monocytes Absolute: 0.6 10*3/uL (ref 0.1–1.0)
Monocytes Relative: 6.4 % (ref 3.0–12.0)
Neutro Abs: 6.2 10*3/uL (ref 1.4–7.7)
Neutrophils Relative %: 66.1 % (ref 43.0–77.0)
Platelets: 225 10*3/uL (ref 150.0–400.0)
RBC: 4.2 Mil/uL (ref 3.87–5.11)
RDW: 13.6 % (ref 11.5–15.5)
WBC: 9.5 10*3/uL (ref 4.0–10.5)

## 2021-08-05 LAB — LIPID PANEL
Cholesterol: 152 mg/dL (ref 0–200)
HDL: 46.3 mg/dL (ref >39.00)
LDL Cholesterol: 84 mg/dL (ref 0–99)
NonHDL: 105.66
Total CHOL/HDL Ratio: 3
Triglycerides: 108 mg/dL (ref 0.0–149.0)
VLDL: 21.6 mg/dL (ref 0.0–40.0)

## 2021-08-05 LAB — COMPREHENSIVE METABOLIC PANEL
ALT: 11 U/L (ref 0–35)
AST: 11 U/L (ref 0–37)
Albumin: 4.2 g/dL (ref 3.5–5.2)
Alkaline Phosphatase: 65 U/L (ref 39–117)
BUN: 12 mg/dL (ref 6–23)
CO2: 27 mEq/L (ref 19–32)
Calcium: 9.1 mg/dL (ref 8.4–10.5)
Chloride: 104 mEq/L (ref 96–112)
Creatinine, Ser: 0.75 mg/dL (ref 0.40–1.20)
GFR: 99.27 mL/min (ref >60.00)
Glucose, Bld: 85 mg/dL (ref 70–99)
Potassium: 4 mEq/L (ref 3.5–5.1)
Sodium: 139 mEq/L (ref 135–145)
Total Bilirubin: 0.4 mg/dL (ref 0.2–1.2)
Total Protein: 6.9 g/dL (ref 6.0–8.3)

## 2021-08-05 LAB — TSH: TSH: 2.93 u[IU]/mL (ref 0.35–5.50)

## 2021-08-05 NOTE — Telephone Encounter (Signed)
lft pt vm on to confirm date 11/08/2021? For fu diag mammo and [place]. thanks

## 2021-08-05 NOTE — Progress Notes (Signed)
Patient ID: Tara Davila, female   DOB: 01-14-1981, 40 y.o.   MRN: [id number]   Subjective:    Patient ID: Tara Davila, female    DOB: [birth year], 40 y.o.   MRN: [id number]  This visit occurred during the SARS-CoV-2 public health emergency.  Safety protocols were in place, including screening questions prior to the visit, additional usage of staff PPE, and extensive cleaning of exam room while observing appropriate contact time as indicated for disinfecting solutions.   Patient here for scheduled follow up.   Chief Complaint  Patient presents with   Toe Pain    Left great toe. Intermittent since last visit.   Stress    Follow up on wellbutrin   .   HPI Reports she is doing relatively well.  Handling stress relatively well.  Doing better.  On wellbutrin.  Trying to staying active.  No chest pain or sob reported.  No acid reflux or abdominal pain or cramping reported.  Bowels stable.  Has noticed left great toe pain.  Flares intermittently.  Has moved to right great toe.  Tip of toe is sensitive.  Notices sensitivity when water hits toe.     Past Medical History:  Diagnosis Date   Allergy    History of chicken pox    Hx: UTI (urinary tract infection)    Past Surgical History:  Procedure Laterality Date   WISDOM TOOTH EXTRACTION     Family History  Problem Relation Age of Onset   Hyperlipidemia Mother    Hypertension Father    Cancer Maternal Grandmother        breast   Breast cancer Maternal Grandmother 45   Hyperlipidemia Maternal Grandfather    Heart disease Maternal Grandfather    Hypertension Maternal Grandfather    Stroke Paternal Grandmother    Breast cancer Paternal Aunt    Social History   Socioeconomic History   Marital status: Married    Spouse name: Not on file   Number of children: Not on file   Years of education: Not on file   Highest education level: Not on file  Occupational History   Not on file  Tobacco Use   Smoking status: Never    Smokeless tobacco: Never  Substance and Sexual Activity   Alcohol use: Not Currently    Alcohol/week: 0.0 standard drinks   Drug use: No   Sexual activity: Not on file  Other Topics Concern   Not on file  Social History Narrative   Lives at home   Social Determinants of Health   Financial Resource Strain: Not on file  Food Insecurity: Not on file  Transportation Needs: Not on file  Physical Activity: Not on file  Stress: Not on file  Social Connections: Not on file     Review of Systems  Constitutional:  Negative for appetite change and unexpected weight change.  HENT:  Negative for congestion and sinus pressure.   Respiratory:  Negative for cough, chest tightness and shortness of breath.   Cardiovascular:  Negative for chest pain, palpitations and leg swelling.  Gastrointestinal:  Negative for abdominal pain, diarrhea, nausea and vomiting.  Genitourinary:  Negative for difficulty urinating and dysuria.  Musculoskeletal:  Negative for joint swelling and myalgias.       Left > right great toe pain.  Tip sensitivity.    Skin:  Negative for color change and rash.  Neurological:  Negative for dizziness, light-headedness and headaches.  Psychiatric/Behavioral:  Negative for agitation and dysphoric  mood.       Objective:     BP 118/72   Pulse 89   Temp 98 F (36.7 C)   Resp 16   Ht '5\' 5"'$  (1.651 m)   Wt 182 lb 12.8 oz (82.9 kg)   SpO2 99%   BMI 30.42 kg/m  Wt Readings from Last 3 Encounters:  08/05/21 182 lb 12.8 oz (82.9 kg)  04/17/21 180 lb (81.6 kg)  10/11/20 189 lb (85.7 kg)    Physical Exam   Outpatient Encounter Medications as of 08/05/2021  Medication Sig   albuterol (VENTOLIN HFA) 108 (90 Base) MCG/ACT inhaler Inhale 2 puffs into the lungs as needed.   buPROPion (WELLBUTRIN XL) 150 MG 24 hr tablet TAKE 1 TABLET BY MOUTH EVERY DAY   cetirizine (ZYRTEC) 10 MG tablet Take 1 tablet by mouth in the morning and at bedtime.   mometasone (ELOCON) 0.1 % cream  Apply 1 application topically as needed.   montelukast (SINGULAIR) 10 MG tablet Take 1 tablet by mouth daily.   No facility-administered encounter medications on file as of 08/05/2021.     Lab Results  Component Value Date   WBC 9.5 08/05/2021   HGB 12.5 08/05/2021   HCT 38.2 08/05/2021   PLT 225.0 08/05/2021   GLUCOSE 85 08/05/2021   CHOL 152 08/05/2021   TRIG 108.0 08/05/2021   HDL 46.30 08/05/2021   LDLCALC 84 08/05/2021   ALT 11 08/05/2021   AST 11 08/05/2021   NA 139 08/05/2021   K 4.0 08/05/2021   CL 104 08/05/2021   CREATININE 0.75 08/05/2021   BUN 12 08/05/2021   CO2 27 08/05/2021   TSH 2.93 08/05/2021    US BREAST LTD UNI LEFT INC AXILLA  Result Date: 11/08/2020 CLINICAL DATA:  40 year old female presenting for follow-up of bilateral breast masses. EXAM: DIGITAL DIAGNOSTIC BILATERAL MAMMOGRAM WITH TOMO AND CAD; ULTRASOUND LEFT BREAST LIMITED; ULTRASOUND RIGHT BREAST LIMITED COMPARISON:  Previous exam(s). ACR Breast Density Category c: The breast tissue is heterogeneously dense, which may obscure small masses. FINDINGS: The masses in the lower-inner right breast and the upper outer left breast are mammographically stable. No suspicious calcifications, masses or areas of distortion are seen in the bilateral breasts. Mammographic images were processed with CAD. Ultrasound of the right breast at 4 o'clock, 5 cm from the nipple demonstrates a stable oval hypoechoic mass measuring 0.7 x 0.5 x 0.6 cm, previously measuring 0.7 x 0.4 x 0.7 cm. Ultrasound of the left breast at 2 o'clock, 4 cm from the nipple demonstrates a stable oval hypoechoic circumscribed mass measuring 1.4 x 0.7 x 1.1 cm, previously measuring 1.4 x 0.7 x 1.2 cm. IMPRESSION: 1.  The likely benign bilateral breast masses are stable. 2.  No mammographic evidence of malignancy in the bilateral breasts. RECOMMENDATION: Bilateral diagnostic mammogram and bilateral ultrasound recommended in 1 year. I have discussed the  findings and recommendations with the patient. If applicable, a reminder letter will be sent to the patient regarding the next appointment. BI-RADS CATEGORY  3: Probably benign. Electronically Signed   By: Ammie Ferrier M.D.   On: 11/08/2020 12:02   US BREAST LTD UNI RIGHT INC AXILLA  Result Date: 11/08/2020 CLINICAL DATA:  40 year old female presenting for follow-up of bilateral breast masses. EXAM: DIGITAL DIAGNOSTIC BILATERAL MAMMOGRAM WITH TOMO AND CAD; ULTRASOUND LEFT BREAST LIMITED; ULTRASOUND RIGHT BREAST LIMITED COMPARISON:  Previous exam(s). ACR Breast Density Category c: The breast tissue is heterogeneously dense, which may obscure small masses. FINDINGS: The  masses in the lower-inner right breast and the upper outer left breast are mammographically stable. No suspicious calcifications, masses or areas of distortion are seen in the bilateral breasts. Mammographic images were processed with CAD. Ultrasound of the right breast at 4 o'clock, 5 cm from the nipple demonstrates a stable oval hypoechoic mass measuring 0.7 x 0.5 x 0.6 cm, previously measuring 0.7 x 0.4 x 0.7 cm. Ultrasound of the left breast at 2 o'clock, 4 cm from the nipple demonstrates a stable oval hypoechoic circumscribed mass measuring 1.4 x 0.7 x 1.1 cm, previously measuring 1.4 x 0.7 x 1.2 cm. IMPRESSION: 1.  The likely benign bilateral breast masses are stable. 2.  No mammographic evidence of malignancy in the bilateral breasts. RECOMMENDATION: Bilateral diagnostic mammogram and bilateral ultrasound recommended in 1 year. I have discussed the findings and recommendations with the patient. If applicable, a reminder letter will be sent to the patient regarding the next appointment. BI-RADS CATEGORY  3: Probably benign. Electronically Signed   By: Ammie Ferrier M.D.   On: 11/08/2020 12:02   MM DIAG BREAST TOMO BILATERAL  Result Date: 11/08/2020 CLINICAL DATA:  40 year old female presenting for follow-up of bilateral breast  masses. EXAM: DIGITAL DIAGNOSTIC BILATERAL MAMMOGRAM WITH TOMO AND CAD; ULTRASOUND LEFT BREAST LIMITED; ULTRASOUND RIGHT BREAST LIMITED COMPARISON:  Previous exam(s). ACR Breast Density Category c: The breast tissue is heterogeneously dense, which may obscure small masses. FINDINGS: The masses in the lower-inner right breast and the upper outer left breast are mammographically stable. No suspicious calcifications, masses or areas of distortion are seen in the bilateral breasts. Mammographic images were processed with CAD. Ultrasound of the right breast at 4 o'clock, 5 cm from the nipple demonstrates a stable oval hypoechoic mass measuring 0.7 x 0.5 x 0.6 cm, previously measuring 0.7 x 0.4 x 0.7 cm. Ultrasound of the left breast at 2 o'clock, 4 cm from the nipple demonstrates a stable oval hypoechoic circumscribed mass measuring 1.4 x 0.7 x 1.1 cm, previously measuring 1.4 x 0.7 x 1.2 cm. IMPRESSION: 1.  The likely benign bilateral breast masses are stable. 2.  No mammographic evidence of malignancy in the bilateral breasts. RECOMMENDATION: Bilateral diagnostic mammogram and bilateral ultrasound recommended in 1 year. I have discussed the findings and recommendations with the patient. If applicable, a reminder letter will be sent to the patient regarding the next appointment. BI-RADS CATEGORY  3: Probably benign. Electronically Signed   By: Ammie Ferrier M.D.   On: 11/08/2020 12:02       Assessment & Plan:   Problem List Items Addressed This Visit     Abnormal mammogram    Saw Dr Bary Castilla -  recommended one year f/u.  Ordered.        Relevant Orders   MM DIAG BREAST TOMO BILATERAL   US BREAST LTD UNI LEFT INC AXILLA   US BREAST LTD UNI RIGHT INC AXILLA   Great toe pain    No increased erythema or lesions on exam.  Left > right.  Tip of toe.  Increased sensitivity.  Discussed wearing wide shoes/support shoes.  Notify if persistent.       Stress    Overall appears to be doing better.  On  wellbutrin.  Follow.       Relevant Orders   CBC with Differential/Platelet (Completed)   Comprehensive metabolic panel (Completed)   TSH (Completed)   Other Visit Diagnoses     Visit for screening mammogram    -  Primary  Screening cholesterol level       Relevant Orders   Lipid panel (Completed)   Need for immunization against influenza       Relevant Orders   Flu Vaccine QUAD 50moIM (Fluarix, Fluzone & Alfiuria Quad PF) (Completed)        CEinar Pheasant MD

## 2021-08-10 ENCOUNTER — Encounter: Payer: Self-pay | Admitting: Internal Medicine

## 2021-08-10 DIAGNOSIS — M79676 Pain in unspecified toe(s): Secondary | ICD-10-CM | POA: Insufficient documentation

## 2021-08-10 NOTE — Assessment & Plan Note (Signed)
Saw Dr Bary Castilla -  recommended one year f/u.  Ordered.

## 2021-08-10 NOTE — Assessment & Plan Note (Signed)
No increased erythema or lesions on exam.  Left > right.  Tip of toe.  Increased sensitivity.  Discussed wearing wide shoes/support shoes.  Notify if persistent.

## 2021-08-10 NOTE — Assessment & Plan Note (Signed)
Overall appears to be doing better.  On wellbutrin.  Follow.

## 2021-10-24 ENCOUNTER — Other Ambulatory Visit: Payer: Self-pay | Admitting: Internal Medicine

## 2021-10-27 NOTE — Telephone Encounter (Signed)
Mirena rcvd/charged 08/14/2019

## 2021-11-12 ENCOUNTER — Other Ambulatory Visit: Payer: BC Managed Care – PPO

## 2021-11-12 ENCOUNTER — Inpatient Hospital Stay: Admission: RE | Admit: 2021-11-12 | Payer: BC Managed Care – PPO | Source: Ambulatory Visit

## 2021-11-28 ENCOUNTER — Ambulatory Visit
Admission: RE | Admit: 2021-11-28 | Discharge: 2021-11-28 | Disposition: A | Discharge status: Home / Self Care | Payer: BC Managed Care – PPO | Source: Ambulatory Visit | Attending: Internal Medicine | Admitting: Internal Medicine

## 2021-11-28 ENCOUNTER — Other Ambulatory Visit: Payer: Self-pay

## 2021-11-28 DIAGNOSIS — R928 Other abnormal and inconclusive findings on diagnostic imaging of breast: Secondary | ICD-10-CM | POA: Insufficient documentation

## 2021-12-01 ENCOUNTER — Telehealth: Payer: Self-pay | Admitting: Internal Medicine

## 2021-12-01 NOTE — Telephone Encounter (Signed)
Pt is returning Dr.Scott phone call [phone]

## 2021-12-01 NOTE — Telephone Encounter (Signed)
Patient return call number verified by the front desk

## 2021-12-02 NOTE — Telephone Encounter (Signed)
Notified Avamae of mammogram results.  She has been seeing Dr Bary Castilla.  Per his last note in 12/2020 - they were to f/u after her mammogram this year.  Discussed with her regarding findings and need for f/u.  She desires to f/u with Dr Bary Castilla - to get his recommendations regarding her mammogram and need for biopsy.  Please call and schedule her a f/u appt (Dr Bary Castilla [facility]).

## 2021-12-04 NOTE — Telephone Encounter (Signed)
Unable to schedule appt. Per surgery dept at [facility] clinic, patient is on a call list and will be called for appt since she is not due until Feb. They will reach out to patient to schedule from their office.

## 2021-12-05 ENCOUNTER — Other Ambulatory Visit: Payer: Self-pay

## 2021-12-05 ENCOUNTER — Ambulatory Visit: Payer: BC Managed Care – PPO | Admitting: Internal Medicine

## 2021-12-05 DIAGNOSIS — R928 Other abnormal and inconclusive findings on diagnostic imaging of breast: Secondary | ICD-10-CM | POA: Diagnosis not present

## 2021-12-05 DIAGNOSIS — R635 Abnormal weight gain: Secondary | ICD-10-CM

## 2021-12-05 DIAGNOSIS — F439 Reaction to severe stress, unspecified: Secondary | ICD-10-CM

## 2021-12-05 NOTE — Progress Notes (Signed)
Patient ID: Tara Davila, female   DOB: March 14, 1981, 41 y.o.   MRN: [id number]   Subjective:    Patient ID: Tara Davila, female    DOB: 24-Jun-1981, 41 y.o.   MRN: [id number]  This visit occurred during the SARS-CoV-2 public health emergency.  Safety protocols were in place, including screening questions prior to the visit, additional usage of staff PPE, and extensive cleaning of exam room while observing appropriate contact time as indicated for disinfecting solutions.   Patient here for a scheduled follow up.   Chief Complaint  Patient presents with   abnormal mammogram   .   HPI Here to follow up regarding increased stress.  Overall she feels she is handling things relatively well.  On wellbutrin.  Does not feel she needs any further intervention.  Discussed weight loss.  Discussed diet and exercise.  Discussed weight loss programs and medication.  She wants to continue to work on diet and exercise.  Will notify Davila if she changes her mind.  No chest pain or sob reported.  No abdominal pain.  Bowels moving.  Recent abnormal mammogram.  Planning to f/u with Dr Bary Castilla.     Past Medical History:  Diagnosis Date   Allergy    History of chicken pox    Hx: UTI (urinary tract infection)    Past Surgical History:  Procedure Laterality Date   WISDOM TOOTH EXTRACTION     Family History  Problem Relation Age of Onset   Hyperlipidemia Mother    Hypertension Father    Cancer Maternal Grandmother        breast   Breast cancer Maternal Grandmother 22   Hyperlipidemia Maternal Grandfather    Heart disease Maternal Grandfather    Hypertension Maternal Grandfather    Stroke Paternal Grandmother    Breast cancer Paternal Aunt    Social History   Socioeconomic History   Marital status: Married    Spouse name: Not on file   Number of children: Not on file   Years of education: Not on file   Highest education level: Not on file  Occupational History   Not on file  Tobacco  Use   Smoking status: Never   Smokeless tobacco: Never  Substance and Sexual Activity   Alcohol use: Not Currently    Alcohol/week: 0.0 standard drinks   Drug use: No   Sexual activity: Not on file  Other Topics Concern   Not on file  Social History Narrative   Lives at home   Social Determinants of Health   Financial Resource Strain: Not on file  Food Insecurity: Not on file  Transportation Needs: Not on file  Physical Activity: Not on file  Stress: Not on file  Social Connections: Not on file     Review of Systems  Constitutional:  Negative for appetite change.       Concern regarding weight gain.   HENT:  Negative for congestion and sinus pressure.   Respiratory:  Negative for cough, chest tightness and shortness of breath.   Cardiovascular:  Negative for chest pain, palpitations and leg swelling.  Gastrointestinal:  Negative for abdominal pain, diarrhea, nausea and vomiting.  Genitourinary:  Negative for difficulty urinating and dysuria.  Musculoskeletal:  Negative for joint swelling and myalgias.  Skin:  Negative for color change and rash.  Neurological:  Negative for dizziness, light-headedness and headaches.  Psychiatric/Behavioral:  Negative for agitation and dysphoric mood.       Objective:  BP 126/72    Pulse 100    Temp 97.6 F (36.4 C)    Resp 16    Ht 5\' 5"  (1.651 m)    Wt 185 lb 12.8 oz (84.3 kg)    SpO2 99%    BMI 30.92 kg/m  Wt Readings from Last 3 Encounters:  12/05/21 185 lb 12.8 oz (84.3 kg)  08/05/21 182 lb 12.8 oz (82.9 kg)  04/17/21 180 lb (81.6 kg)    Physical Exam Vitals reviewed.  Constitutional:      General: She is not in acute distress.    Appearance: Normal appearance.  HENT:     Head: Normocephalic and atraumatic.     Right Ear: External ear normal.     Left Ear: External ear normal.  Eyes:     General: No scleral icterus.       Right eye: No discharge.        Left eye: No discharge.     Conjunctiva/sclera: Conjunctivae  normal.  Neck:     Thyroid: No thyromegaly.  Cardiovascular:     Rate and Rhythm: Normal rate and regular rhythm.  Pulmonary:     Effort: No respiratory distress.     Breath sounds: Normal breath sounds. No wheezing.  Abdominal:     General: Bowel sounds are normal.     Palpations: Abdomen is soft.     Tenderness: There is no abdominal tenderness.  Musculoskeletal:        General: No swelling or tenderness.     Cervical back: Neck supple. No tenderness.  Lymphadenopathy:     Cervical: No cervical adenopathy.  Skin:    Findings: No erythema or rash.  Neurological:     Mental Status: She is alert.  Psychiatric:        Mood and Affect: Mood normal.        Behavior: Behavior normal.     Outpatient Encounter Medications as of 12/05/2021  Medication Sig   albuterol (VENTOLIN HFA) 108 (90 Base) MCG/ACT inhaler Inhale 2 puffs into the lungs as needed.   buPROPion (WELLBUTRIN XL) 150 MG 24 hr tablet TAKE 1 TABLET BY MOUTH EVERY DAY   cetirizine (ZYRTEC) 10 MG tablet Take 1 tablet by mouth in the morning and at bedtime.   mometasone (ELOCON) 0.1 % cream Apply 1 application topically as needed.   montelukast (SINGULAIR) 10 MG tablet Take 1 tablet by mouth daily.   No facility-administered encounter medications on file as of 12/05/2021.     Lab Results  Component Value Date   WBC 9.5 08/05/2021   HGB 12.5 08/05/2021   HCT 38.2 08/05/2021   PLT 225.0 08/05/2021   GLUCOSE 85 08/05/2021   CHOL 152 08/05/2021   TRIG 108.0 08/05/2021   HDL 46.30 08/05/2021   LDLCALC 84 08/05/2021   ALT 11 08/05/2021   AST 11 08/05/2021   NA 139 08/05/2021   K 4.0 08/05/2021   CL 104 08/05/2021   CREATININE 0.75 08/05/2021   BUN 12 08/05/2021   CO2 27 08/05/2021   TSH 2.93 08/05/2021    US BREAST LTD UNI LEFT INC AXILLA  Result Date: 11/28/2021 CLINICAL DATA:  Six-month follow-up of bilateral breast masses EXAM: DIGITAL DIAGNOSTIC BILATERAL MAMMOGRAM WITH TOMOSYNTHESIS AND CAD; ULTRASOUND  RIGHT BREAST LIMITED; ULTRASOUND LEFT BREAST LIMITED TECHNIQUE: Bilateral digital diagnostic mammography and breast tomosynthesis was performed. The images were evaluated with computer-aided detection.; Targeted ultrasound examination of the right breast was performed; Targeted ultrasound examination of the left  breast was performed. COMPARISON:  Previous exam(s). ACR Breast Density Category c: The breast tissue is heterogeneously dense, which may obscure small masses. FINDINGS: The mass in the medial right breast is stable mammographically. The mass in the lateral left breast is stable mammographically. There is a new mass in the central to slightly inferior left breast at a mid depth measuring up to 7 mm which is new. No other suspicious mammographic findings identified in either breast. On physical exam, no suspicious lumps are identified. Targeted ultrasound is performed, showing a stable mass in the left breast at 2 o'clock, 4 cm from the nipple measuring 12 x 6 x 10 mm today versus 14 x 7 by 11 mm previously. The new mass seen in the left breast centrally to slightly inferiorly is not seen sonographically. The mass in the right breast at 4 o'clock, 5 cm from the nipple measures 6 x 4 x 6 mm today, unchanged. IMPRESSION: The 4 o'clock right breast mass and the 2 o'clock left breast mass are stable requiring no additional follow-up. There is a new mass identified on mammography only in the central to slightly inferior left breast at a mid depth with no sonographic correlate. No other suspicious findings. RECOMMENDATION: Recommend stereotactic biopsy of the new left breast mass. I have discussed the findings and recommendations with the patient. If applicable, a reminder letter will be sent to the patient regarding the next appointment. BI-RADS CATEGORY  4: Suspicious. Electronically Signed   By: Dorise Bullion III M.D.   On: 11/28/2021 15:54  US BREAST LTD UNI RIGHT INC AXILLA  Result Date:  11/28/2021 CLINICAL DATA:  Six-month follow-up of bilateral breast masses EXAM: DIGITAL DIAGNOSTIC BILATERAL MAMMOGRAM WITH TOMOSYNTHESIS AND CAD; ULTRASOUND RIGHT BREAST LIMITED; ULTRASOUND LEFT BREAST LIMITED TECHNIQUE: Bilateral digital diagnostic mammography and breast tomosynthesis was performed. The images were evaluated with computer-aided detection.; Targeted ultrasound examination of the right breast was performed; Targeted ultrasound examination of the left breast was performed. COMPARISON:  Previous exam(s). ACR Breast Density Category c: The breast tissue is heterogeneously dense, which may obscure small masses. FINDINGS: The mass in the medial right breast is stable mammographically. The mass in the lateral left breast is stable mammographically. There is a new mass in the central to slightly inferior left breast at a mid depth measuring up to 7 mm which is new. No other suspicious mammographic findings identified in either breast. On physical exam, no suspicious lumps are identified. Targeted ultrasound is performed, showing a stable mass in the left breast at 2 o'clock, 4 cm from the nipple measuring 12 x 6 x 10 mm today versus 14 x 7 by 11 mm previously. The new mass seen in the left breast centrally to slightly inferiorly is not seen sonographically. The mass in the right breast at 4 o'clock, 5 cm from the nipple measures 6 x 4 x 6 mm today, unchanged. IMPRESSION: The 4 o'clock right breast mass and the 2 o'clock left breast mass are stable requiring no additional follow-up. There is a new mass identified on mammography only in the central to slightly inferior left breast at a mid depth with no sonographic correlate. No other suspicious findings. RECOMMENDATION: Recommend stereotactic biopsy of the new left breast mass. I have discussed the findings and recommendations with the patient. If applicable, a reminder letter will be sent to the patient regarding the next appointment. BI-RADS CATEGORY  4:  Suspicious. Electronically Signed   By: Dorise Bullion III M.D.   On:  11/28/2021 15:54  MM DIAG BREAST TOMO BILATERAL  Result Date: 11/28/2021 CLINICAL DATA:  Six-month follow-up of bilateral breast masses EXAM: DIGITAL DIAGNOSTIC BILATERAL MAMMOGRAM WITH TOMOSYNTHESIS AND CAD; ULTRASOUND RIGHT BREAST LIMITED; ULTRASOUND LEFT BREAST LIMITED TECHNIQUE: Bilateral digital diagnostic mammography and breast tomosynthesis was performed. The images were evaluated with computer-aided detection.; Targeted ultrasound examination of the right breast was performed; Targeted ultrasound examination of the left breast was performed. COMPARISON:  Previous exam(s). ACR Breast Density Category c: The breast tissue is heterogeneously dense, which may obscure small masses. FINDINGS: The mass in the medial right breast is stable mammographically. The mass in the lateral left breast is stable mammographically. There is a new mass in the central to slightly inferior left breast at a mid depth measuring up to 7 mm which is new. No other suspicious mammographic findings identified in either breast. On physical exam, no suspicious lumps are identified. Targeted ultrasound is performed, showing a stable mass in the left breast at 2 o'clock, 4 cm from the nipple measuring 12 x 6 x 10 mm today versus 14 x 7 by 11 mm previously. The new mass seen in the left breast centrally to slightly inferiorly is not seen sonographically. The mass in the right breast at 4 o'clock, 5 cm from the nipple measures 6 x 4 x 6 mm today, unchanged. IMPRESSION: The 4 o'clock right breast mass and the 2 o'clock left breast mass are stable requiring no additional follow-up. There is a new mass identified on mammography only in the central to slightly inferior left breast at a mid depth with no sonographic correlate. No other suspicious findings. RECOMMENDATION: Recommend stereotactic biopsy of the new left breast mass. I have discussed the findings and  recommendations with the patient. If applicable, a reminder letter will be sent to the patient regarding the next appointment. BI-RADS CATEGORY  4: Suspicious. Electronically Signed   By: Dorise Bullion III M.D.   On: 11/28/2021 15:54      Assessment & Plan:   Problem List Items Addressed This Visit     Abnormal mammogram    Recent mammogram - radiology recommended biospy.  Planning to f/u with Dr Bary Castilla.  Discussed.       Stress    Overall appears to be handling things relatively well.  Follow.  Continue wellbutrin.       Weight gain    She is concerned regarding weight gain.  Discussed diet and exercise.  Follow.          Einar Pheasant, MD

## 2021-12-13 ENCOUNTER — Encounter: Payer: Self-pay | Admitting: Internal Medicine

## 2021-12-13 NOTE — Assessment & Plan Note (Signed)
Overall appears to be handling things relatively well.  Follow.  Continue wellbutrin.

## 2021-12-13 NOTE — Assessment & Plan Note (Signed)
She is concerned regarding weight gain.  Discussed diet and exercise.  Follow.

## 2021-12-13 NOTE — Assessment & Plan Note (Signed)
Recent mammogram - radiology recommended biospy.  Planning to f/u with Dr Bary Castilla.  Discussed.

## 2021-12-16 ENCOUNTER — Other Ambulatory Visit: Payer: Self-pay | Admitting: General Surgery

## 2021-12-16 DIAGNOSIS — N6320 Unspecified lump in the left breast, unspecified quadrant: Secondary | ICD-10-CM

## 2021-12-24 ENCOUNTER — Ambulatory Visit
Admission: RE | Admit: 2021-12-24 | Discharge: 2021-12-24 | Disposition: A | Discharge status: Home / Self Care | Payer: BC Managed Care – PPO | Source: Ambulatory Visit | Attending: General Surgery | Admitting: General Surgery

## 2021-12-24 ENCOUNTER — Other Ambulatory Visit: Payer: Self-pay

## 2021-12-24 DIAGNOSIS — N6320 Unspecified lump in the left breast, unspecified quadrant: Secondary | ICD-10-CM | POA: Diagnosis not present

## 2021-12-24 HISTORY — PX: BREAST BIOPSY: SHX20

## 2021-12-25 LAB — SURGICAL PATHOLOGY

## 2022-04-23 ENCOUNTER — Encounter: Payer: Self-pay | Admitting: Internal Medicine

## 2022-04-23 ENCOUNTER — Ambulatory Visit (INDEPENDENT_AMBULATORY_CARE_PROVIDER_SITE_OTHER): Payer: BC Managed Care – PPO | Admitting: Internal Medicine

## 2022-04-23 ENCOUNTER — Other Ambulatory Visit (HOSPITAL_COMMUNITY)
Admission: RE | Admit: 2022-04-23 | Discharge: 2022-04-23 | Disposition: A | Discharge status: Home / Self Care | Payer: BC Managed Care – PPO | Source: Ambulatory Visit | Attending: Internal Medicine | Admitting: Internal Medicine

## 2022-04-23 VITALS — BP 138/72 | HR 66 | Temp 97.5°F | Resp 18 | Ht 65.0 in | Wt 192.0 lb

## 2022-04-23 DIAGNOSIS — R635 Abnormal weight gain: Secondary | ICD-10-CM | POA: Diagnosis not present

## 2022-04-23 DIAGNOSIS — F439 Reaction to severe stress, unspecified: Secondary | ICD-10-CM

## 2022-04-23 DIAGNOSIS — Z124 Encounter for screening for malignant neoplasm of cervix: Secondary | ICD-10-CM | POA: Diagnosis not present

## 2022-04-23 DIAGNOSIS — K582 Mixed irritable bowel syndrome: Secondary | ICD-10-CM

## 2022-04-23 DIAGNOSIS — Z13220 Encounter for screening for lipoid disorders: Secondary | ICD-10-CM | POA: Diagnosis not present

## 2022-04-23 DIAGNOSIS — Z789 Other specified health status: Secondary | ICD-10-CM

## 2022-04-23 DIAGNOSIS — Z0000 Encounter for general adult medical examination without abnormal findings: Secondary | ICD-10-CM | POA: Diagnosis not present

## 2022-04-23 DIAGNOSIS — R928 Other abnormal and inconclusive findings on diagnostic imaging of breast: Secondary | ICD-10-CM

## 2022-04-23 LAB — BASIC METABOLIC PANEL
BUN: 12 mg/dL (ref 6–23)
CO2: 26 mEq/L (ref 19–32)
Calcium: 9.5 mg/dL (ref 8.4–10.5)
Chloride: 104 mEq/L (ref 96–112)
Creatinine, Ser: 0.76 mg/dL (ref 0.40–1.20)
GFR: 97.22 mL/min (ref >60.00)
Glucose, Bld: 84 mg/dL (ref 70–99)
Potassium: 3.7 mEq/L (ref 3.5–5.1)
Sodium: 139 mEq/L (ref 135–145)

## 2022-04-23 LAB — LIPID PANEL
Cholesterol: 153 mg/dL (ref 0–200)
HDL: 52.7 mg/dL (ref >39.00)
LDL Cholesterol: 82 mg/dL (ref 0–99)
NonHDL: 99.96
Total CHOL/HDL Ratio: 3
Triglycerides: 92 mg/dL (ref 0.0–149.0)
VLDL: 18.4 mg/dL (ref 0.0–40.0)

## 2022-04-23 LAB — HEPATIC FUNCTION PANEL
ALT: 16 U/L (ref 0–35)
AST: 14 U/L (ref 0–37)
Albumin: 4.2 g/dL (ref 3.5–5.2)
Alkaline Phosphatase: 68 U/L (ref 39–117)
Bilirubin, Direct: 0 mg/dL (ref 0.0–0.3)
Total Bilirubin: 0.3 mg/dL (ref 0.2–1.2)
Total Protein: 6.8 g/dL (ref 6.0–8.3)

## 2022-04-23 LAB — TSH: TSH: 3 u[IU]/mL (ref 0.35–5.50)

## 2022-04-23 NOTE — Assessment & Plan Note (Signed)
Physical today 04/23/22.  04/23/22 - PAP.

## 2022-04-23 NOTE — Progress Notes (Unsigned)
Patient ID: Tara Davila, female   DOB: [birth year], 41 y.o.   MRN: [id number]   Subjective:    Patient ID: Tara Davila, female    DOB: Jan 20, 1981, 41 y.o.   MRN: [id number]  This visit occurred during the SARS-CoV-2 public health emergency.  Safety protocols were in place, including screening questions prior to the visit, additional usage of staff PPE, and extensive cleaning of exam room while observing appropriate contact time as indicated for disinfecting solutions.   Patient here for  Chief Complaint  Patient presents with   Annual Exam    CPE   .   HPI    Past Medical History:  Diagnosis Date   Allergy    History of chicken pox    Hx: UTI (urinary tract infection)    Past Surgical History:  Procedure Laterality Date   BREAST BIOPSY Left 12/24/2021   left breast stereo x clip path pending   WISDOM TOOTH EXTRACTION     Family History  Problem Relation Age of Onset   Hyperlipidemia Mother    Hypertension Father    Cancer Maternal Grandmother        breast   Breast cancer Maternal Grandmother 42   Hyperlipidemia Maternal Grandfather    Heart disease Maternal Grandfather    Hypertension Maternal Grandfather    Stroke Paternal Grandmother    Breast cancer Paternal Aunt    Social History   Socioeconomic History   Marital status: Married    Spouse name: Not on file   Number of children: Not on file   Years of education: Not on file   Highest education level: Not on file  Occupational History   Not on file  Tobacco Use   Smoking status: Never   Smokeless tobacco: Never  Substance and Sexual Activity   Alcohol use: Not Currently    Alcohol/week: 0.0 standard drinks   Drug use: No   Sexual activity: Not on file  Other Topics Concern   Not on file  Social History Narrative   Lives at home   Social Determinants of Health   Financial Resource Strain: Not on file  Food Insecurity: Not on file  Transportation Needs: Not on file  Physical  Activity: Not on file  Stress: Not on file  Social Connections: Not on file     Review of Systems     Objective:     BP 138/72 (BP Location: Left Arm, Patient Position: Sitting, Cuff Size: Large)   Pulse 66   Temp (!) 97.5 F (36.4 C) (Temporal)   Resp 18   Ht '5\' 5"'$  (1.651 m)   Wt 192 lb (87.1 kg)   SpO2 98%   BMI 31.95 kg/m  Wt Readings from Last 3 Encounters:  04/23/22 192 lb (87.1 kg)  12/05/21 185 lb 12.8 oz (84.3 kg)  08/05/21 182 lb 12.8 oz (82.9 kg)    Physical Exam   Outpatient Encounter Medications as of 04/23/2022  Medication Sig   albuterol (VENTOLIN HFA) 108 (90 Base) MCG/ACT inhaler Inhale 2 puffs into the lungs as needed.   buPROPion (WELLBUTRIN XL) 150 MG 24 hr tablet TAKE 1 TABLET BY MOUTH EVERY DAY   cetirizine (ZYRTEC) 10 MG tablet Take 1 tablet by mouth in the morning and at bedtime.   mometasone (ELOCON) 0.1 % cream Apply 1 application topically as needed.   montelukast (SINGULAIR) 10 MG tablet Take 1 tablet by mouth daily.   No facility-administered encounter medications on file as of  04/23/2022.     Lab Results  Component Value Date   WBC 9.5 08/05/2021   HGB 12.5 08/05/2021   HCT 38.2 08/05/2021   PLT 225.0 08/05/2021   GLUCOSE 85 08/05/2021   CHOL 152 08/05/2021   TRIG 108.0 08/05/2021   HDL 46.30 08/05/2021   LDLCALC 84 08/05/2021   ALT 11 08/05/2021   AST 11 08/05/2021   NA 139 08/05/2021   K 4.0 08/05/2021   CL 104 08/05/2021   CREATININE 0.75 08/05/2021   BUN 12 08/05/2021   CO2 27 08/05/2021   TSH 2.93 08/05/2021    MM CLIP PLACEMENT LEFT  Result Date: 12/24/2021 CLINICAL DATA:  Status post stereotactic guided biopsy EXAM: 3D DIAGNOSTIC LEFT MAMMOGRAM POST STEREOTACTIC BIOPSY COMPARISON:  Previous exam(s). FINDINGS: 3D Mammographic images were obtained following stereotactic guided biopsy of a LEFT breast mass. The X shaped biopsy marking clip is in expected position at the site of biopsy. IMPRESSION: Appropriate positioning of  the X shaped biopsy marking clip at the site of biopsy in the central inferior breast. Final Assessment: Post Procedure Mammograms for Marker Placement Electronically Signed   By: Valentino Saxon M.D.   On: 12/24/2021 09:33  MM LT BREAST BX W LOC DEV 1ST LESION IMAGE BX SPEC STEREO GUIDE  Addendum Date: 12/25/2021   ADDENDUM REPORT: 12/25/2021 13:44 ADDENDUM: PATHOLOGY revealed: A. BREAST, LEFT LOWER OUTER MIDDLE DEPTH; STEREOTACTIC BIOPSY: - FIBROEPITHELIAL LESION. Comment: Sections demonstrate fragments of a fibroepithelial lesion with a predominantly intracanalicular architecture and mild to focally moderately cellular myxoid stroma. The stromal component demonstrates mild cytologic atypia and rare mitotic figures. 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 Malignancy is not identified in the material sampled. Correlation with clinical impression and radiographic findings is required. Pathology results are CONCORDANT with imaging findings, per Dr. Valentino Saxon with consideration of excision. Pathology results and recommendations were discussed with patient via telephone on 12/25/2021. Patient reported biopsy site doing well with no adverse symptoms, and only slight tenderness at the site. Post biopsy care instructions were reviewed, questions were answered and my direct phone number was provided. Patient was instructed to call [facility] for any additional questions or concerns related to biopsy site. RECOMMENDATIONS: 1. 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 Al Pimple RN and Tanya Nones RN at [facility] were notified by Electa Sniff RN on 12/25/2021. 2. If monitoring (as opposed to excision) is preferred by surgeon and patient, please schedule patient for a six month Diagnostic Mammogram and possible left breast Ultrasound to  ensure stability of the biopsied lesion. Pathology results reported by Electa Sniff RN on 12/25/2021. Electronically Signed   By: Valentino Saxon M.D.   On: 12/25/2021 13:44   Result Date: 12/25/2021 CLINICAL DATA:  Indeterminate LEFT breast mass EXAM: LEFT BREAST STEREOTACTIC CORE NEEDLE BIOPSY COMPARISON:  Previous exams. FINDINGS: The patient and I discussed the procedure of stereotactic-guided biopsy including benefits and alternatives. We discussed the high likelihood of a successful procedure. We discussed the risks of the procedure including infection, bleeding, tissue injury, clip migration, and inadequate sampling. Informed written consent was given. The usual time out protocol was performed immediately prior to the procedure. Using sterile technique and 1% lidocaine and 1% lidocaine with epinephrine as local anesthetic, under stereotactic guidance, a 9 gauge vacuum assisted device was used to perform core needle biopsy of a mass in the lower central  aspect of the left breast using a superior approach. Specimen radiograph was performed showing representative tissue. Lesion quadrant: Lower outer quadrant At the conclusion of the procedure, an X shaped tissue marker clip was deployed into the biopsy cavity. Follow-up 2-view mammogram was performed and dictated separately. IMPRESSION: Stereotactic-guided biopsy of a LEFT breast mass. No apparent complications. Electronically Signed: By: Valentino Saxon M.D. On: 12/24/2021 09:32      Assessment & Plan:   Problem List Items Addressed This Visit     Weight gain   Other Visit Diagnoses     Screening for lipid disorders    -  Primary        Einar Pheasant, MD

## 2022-04-25 ENCOUNTER — Encounter: Payer: Self-pay | Admitting: Internal Medicine

## 2022-04-25 NOTE — Assessment & Plan Note (Signed)
Has mirena IUD

## 2022-04-25 NOTE — Assessment & Plan Note (Signed)
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

## 2022-04-25 NOTE — Assessment & Plan Note (Signed)
Discussed with her today.  Discussed diet and exercise.  Has been doing weight watchers.  Discussed prescription medication.  She desires to start.  Check routine labs.

## 2022-04-25 NOTE — Assessment & Plan Note (Signed)
Stable

## 2022-04-25 NOTE — Assessment & Plan Note (Signed)
Overall appears to be handling things relatively well.  Follow.   

## 2022-04-26 ENCOUNTER — Other Ambulatory Visit: Payer: Self-pay | Admitting: Internal Medicine

## 2022-04-27 ENCOUNTER — Other Ambulatory Visit: Payer: Self-pay

## 2022-04-27 LAB — CYTOLOGY - PAP
Comment: NEGATIVE
Diagnosis: NEGATIVE
High risk HPV: NEGATIVE

## 2022-04-27 MED ORDER — WEGOVY 0.25 MG/0.5ML ~~LOC~~ SOAJ: 0.2500 mg | SUBCUTANEOUS | 0 refills | Status: DC

## 2022-04-28 ENCOUNTER — Telehealth: Payer: Self-pay

## 2022-04-28 NOTE — Telephone Encounter (Signed)
JANALEE GROBE ([employer]) Rx #: [id number] Wegovy 0.'25MG'$ /0.5ML auto-injectors   Form [employer] Electronic PA Form [phone] NCPDP) Created 24 hours ago Sent to Plan 4 minutes ago Plan Response 4 minutes ago Submit Clinical Questions 3 minutes ago Determination Wait for Determination Please wait for [employer] NCPDP 2017 to return a determination.

## 2022-05-11 ENCOUNTER — Encounter: Payer: Self-pay | Admitting: Internal Medicine

## 2022-05-14 ENCOUNTER — Ambulatory Visit: Payer: BC Managed Care – PPO

## 2022-05-18 ENCOUNTER — Other Ambulatory Visit: Payer: Self-pay | Admitting: General Surgery

## 2022-05-18 DIAGNOSIS — N6320 Unspecified lump in the left breast, unspecified quadrant: Secondary | ICD-10-CM

## 2022-06-03 ENCOUNTER — Other Ambulatory Visit: Payer: Self-pay | Admitting: General Surgery

## 2022-06-03 DIAGNOSIS — N6320 Unspecified lump in the left breast, unspecified quadrant: Secondary | ICD-10-CM

## 2022-06-24 ENCOUNTER — Ambulatory Visit
Admission: RE | Admit: 2022-06-24 | Discharge: 2022-06-24 | Disposition: A | Discharge status: Home / Self Care | Payer: BC Managed Care – PPO | Source: Ambulatory Visit | Attending: General Surgery | Admitting: General Surgery

## 2022-06-24 DIAGNOSIS — N6320 Unspecified lump in the left breast, unspecified quadrant: Secondary | ICD-10-CM | POA: Diagnosis not present

## 2022-06-30 ENCOUNTER — Other Ambulatory Visit: Payer: Self-pay | Admitting: General Surgery

## 2022-06-30 DIAGNOSIS — N62 Hypertrophy of breast: Secondary | ICD-10-CM

## 2022-07-30 ENCOUNTER — Encounter: Payer: Self-pay | Admitting: Internal Medicine

## 2022-07-30 ENCOUNTER — Ambulatory Visit: Payer: BC Managed Care – PPO | Admitting: Internal Medicine

## 2022-07-30 VITALS — BP 126/76 | HR 96 | Temp 98.8°F | Ht 65.0 in | Wt 184.6 lb

## 2022-07-30 DIAGNOSIS — F439 Reaction to severe stress, unspecified: Secondary | ICD-10-CM | POA: Diagnosis not present

## 2022-07-30 DIAGNOSIS — N63 Unspecified lump in unspecified breast: Secondary | ICD-10-CM

## 2022-07-30 DIAGNOSIS — Z713 Dietary counseling and surveillance: Secondary | ICD-10-CM

## 2022-07-30 DIAGNOSIS — Z23 Encounter for immunization: Secondary | ICD-10-CM | POA: Diagnosis not present

## 2022-07-30 NOTE — Progress Notes (Signed)
Patient ID: Tara Davila, female   DOB: Apr 19, 1981, 41 y.o.   MRN: [id number]   Subjective:    Patient ID: Tara Davila, female    DOB: [birth year], 41 y.o.   MRN: [id number]   Patient here for  Chief Complaint  Patient presents with   Follow-up   .   HPI Here to follow up regarding her weight.  Was prescribed wegovy last visit.  Did not get filled. Has started back on Faith.  Exercising.  Has lost weight.  Doing well without the medication.  Evaluated by Dr Bary Castilla - f/u breast nodule- benign.  Recommended f/u bilateral diagnostic mammogram in 6 months.  Also discussed the possibility of breast reduction given increased shoulder and upper back pain.  Was referred to Dr Marla Roe. Has appt tomorrow.  No chest pain or sob.  No acid reflux.  No abdominal pain.  Bowels moving.     Past Medical History:  Diagnosis Date   Allergy    History of chicken pox    Hx: UTI (urinary tract infection)    Past Surgical History:  Procedure Laterality Date   BREAST BIOPSY Left 12/24/2021   left breast stereo x clip neg   WISDOM TOOTH EXTRACTION     Family History  Problem Relation Age of Onset   Hyperlipidemia Mother    Hypertension Father    Cancer Maternal Grandmother        breast   Breast cancer Maternal Grandmother 30   Hyperlipidemia Maternal Grandfather    Heart disease Maternal Grandfather    Hypertension Maternal Grandfather    Stroke Paternal Grandmother    Breast cancer Paternal Aunt    Social History   Socioeconomic History   Marital status: Married    Spouse name: Not on file   Number of children: Not on file   Years of education: Not on file   Highest education level: Not on file  Occupational History   Not on file  Tobacco Use   Smoking status: Never   Smokeless tobacco: Never  Substance and Sexual Activity   Alcohol use: Not Currently    Alcohol/week: 0.0 standard drinks of alcohol   Drug use: No   Sexual activity: Not on file  Other Topics  Concern   Not on file  Social History Narrative   Lives at home   Social Determinants of Health   Financial Resource Strain: Not on file  Food Insecurity: Not on file  Transportation Needs: Not on file  Physical Activity: Not on file  Stress: Not on file  Social Connections: Not on file     Review of Systems  Constitutional:  Negative for appetite change and unexpected weight change.  HENT:  Negative for congestion and sinus pressure.   Respiratory:  Negative for cough, chest tightness and shortness of breath.   Cardiovascular:  Negative for chest pain, palpitations and leg swelling.  Gastrointestinal:  Negative for abdominal pain, diarrhea, nausea and vomiting.  Genitourinary:  Negative for difficulty urinating and dysuria.  Musculoskeletal:  Negative for joint swelling and myalgias.  Skin:  Negative for color change and rash.  Neurological:  Negative for dizziness, light-headedness and headaches.  Psychiatric/Behavioral:  Negative for agitation and dysphoric mood.        Objective:     BP 126/76 (BP Location: Left Arm, Patient Position: Sitting, Cuff Size: Normal)   Pulse 96   Temp 98.8 F (37.1 C) (Oral)   Ht '5\' 5"'$  (1.651 m)  Wt 184 lb 9.6 oz (83.7 kg)   LMP  (LMP Unknown)   SpO2 98%   BMI 30.72 kg/m  Wt Readings from Last 3 Encounters:  07/31/22 183 lb 3.2 oz (83.1 kg)  07/30/22 184 lb 9.6 oz (83.7 kg)  04/23/22 192 lb (87.1 kg)    Physical Exam Vitals reviewed.  Constitutional:      General: She is not in acute distress.    Appearance: Normal appearance.  HENT:     Head: Normocephalic and atraumatic.     Right Ear: External ear normal.     Left Ear: External ear normal.  Eyes:     General: No scleral icterus.       Right eye: No discharge.        Left eye: No discharge.     Conjunctiva/sclera: Conjunctivae normal.  Neck:     Thyroid: No thyromegaly.  Cardiovascular:     Rate and Rhythm: Normal rate and regular rhythm.  Pulmonary:     Effort:  No respiratory distress.     Breath sounds: Normal breath sounds. No wheezing.  Abdominal:     General: Bowel sounds are normal.     Palpations: Abdomen is soft.     Tenderness: There is no abdominal tenderness.  Musculoskeletal:        General: No swelling or tenderness.     Cervical back: Neck supple. No tenderness.  Lymphadenopathy:     Cervical: No cervical adenopathy.  Skin:    Findings: No erythema or rash.  Neurological:     Mental Status: She is alert.  Psychiatric:        Mood and Affect: Mood normal.        Behavior: Behavior normal.      Outpatient Encounter Medications as of 07/30/2022  Medication Sig   albuterol (VENTOLIN HFA) 108 (90 Base) MCG/ACT inhaler Inhale 2 puffs into the lungs as needed.   buPROPion (WELLBUTRIN XL) 150 MG 24 hr tablet TAKE 1 TABLET BY MOUTH EVERY DAY   cetirizine (ZYRTEC) 10 MG tablet Take 1 tablet by mouth in the morning and at bedtime.   mometasone (ELOCON) 0.1 % cream Apply 1 application topically as needed.   montelukast (SINGULAIR) 10 MG tablet Take 1 tablet by mouth daily.   [DISCONTINUED] Semaglutide-Weight Management (WEGOVY) 0.25 MG/0.5ML SOAJ Inject 0.25 mg into the skin once a week. (Patient not taking: Reported on 07/30/2022)   No facility-administered encounter medications on file as of 07/30/2022.     Lab Results  Component Value Date   WBC 9.5 08/05/2021   HGB 12.5 08/05/2021   HCT 38.2 08/05/2021   PLT 225.0 08/05/2021   GLUCOSE 84 04/23/2022   CHOL 153 04/23/2022   TRIG 92.0 04/23/2022   HDL 52.70 04/23/2022   LDLCALC 82 04/23/2022   ALT 16 04/23/2022   AST 14 04/23/2022   NA 139 04/23/2022   K 3.7 04/23/2022   CL 104 04/23/2022   CREATININE 0.76 04/23/2022   BUN 12 04/23/2022   CO2 26 04/23/2022   TSH 3.00 04/23/2022    MM DIAG BREAST TOMO UNI LEFT  Result Date: 06/24/2022 CLINICAL DATA:  Status post stereotactic guided biopsy demonstrating fibroepithelial lesion. No sonographic correlate was identified. Short  term follow-up was elected by patient. EXAM: DIGITAL DIAGNOSTIC UNILATERAL LEFT MAMMOGRAM WITH TOMOSYNTHESIS TECHNIQUE: Left digital diagnostic mammography and breast tomosynthesis was performed. COMPARISON:  Previous exam(s). ACR Breast Density Category c: The breast tissue is heterogeneously dense, which may obscure small masses.  FINDINGS: In the central LEFT breast at middle depth, there is an oval of circumscribed mass. It measures 7 x 7 by 6 mm. X clip is noted immediately adjacent to this mass. This previously measured 6 x 6 by 5 mm. Additional mammographically stable oval circumscribed mass is noted in the LEFT upper outer breast at 2 o'clock. No new suspicious findings are noted in the LEFT breast. 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 Indolent growth could be seen in either end of the benign fibroepithelial spectrum. Recommend continued close attention on follow-up with diagnostic mammogram (with ultrasound as deemed necessary) in 6 months versus surgical excision. This will establish 1 year of definitive stability. RECOMMENDATION: Bilateral diagnostic mammogram (with LEFT breast ultrasound as deemed necessary) in 6 months. I have discussed the findings and recommendations with the patient. If applicable, a reminder letter will be sent to the patient regarding the next appointment. BI-RADS CATEGORY  2: Benign. Electronically Signed   By: Valentino Saxon M.D.   On: 06/24/2022 11:37      Assessment & Plan:   Problem List Items Addressed This Visit     Breast nodule    Saw Dr Bary Castilla as outlined.        Stress    Overall appears to be handling things relatively well.  Follow.       Weight loss counseling, encounter for    Doing well with nutrisystem.  Exercising.  Follow.        Other Visit Diagnoses     Need for immunization against influenza    -  Primary   Relevant Orders    Flu Vaccine QUAD 25moIM (Fluarix, Fluzone & Alfiuria Quad PF) (Completed)        CEinar Pheasant MD

## 2022-07-31 ENCOUNTER — Ambulatory Visit: Payer: BC Managed Care – PPO | Admitting: Plastic Surgery

## 2022-07-31 ENCOUNTER — Encounter: Payer: Self-pay | Admitting: Plastic Surgery

## 2022-07-31 VITALS — BP 108/76 | HR 109 | Ht 65.0 in | Wt 183.2 lb

## 2022-07-31 DIAGNOSIS — M546 Pain in thoracic spine: Secondary | ICD-10-CM | POA: Diagnosis not present

## 2022-07-31 DIAGNOSIS — G8929 Other chronic pain: Secondary | ICD-10-CM

## 2022-07-31 DIAGNOSIS — M545 Low back pain, unspecified: Secondary | ICD-10-CM

## 2022-07-31 DIAGNOSIS — M542 Cervicalgia: Secondary | ICD-10-CM

## 2022-07-31 DIAGNOSIS — R21 Rash and other nonspecific skin eruption: Secondary | ICD-10-CM | POA: Diagnosis not present

## 2022-07-31 DIAGNOSIS — Z6830 Body mass index (BMI) 30.0-30.9, adult: Secondary | ICD-10-CM

## 2022-07-31 DIAGNOSIS — M549 Dorsalgia, unspecified: Secondary | ICD-10-CM | POA: Insufficient documentation

## 2022-07-31 DIAGNOSIS — N63 Unspecified lump in unspecified breast: Secondary | ICD-10-CM

## 2022-07-31 DIAGNOSIS — Z803 Family history of malignant neoplasm of breast: Secondary | ICD-10-CM

## 2022-07-31 DIAGNOSIS — N62 Hypertrophy of breast: Secondary | ICD-10-CM | POA: Diagnosis not present

## 2022-07-31 DIAGNOSIS — S21002A Unspecified open wound of left breast, initial encounter: Secondary | ICD-10-CM | POA: Insufficient documentation

## 2022-07-31 DIAGNOSIS — N641 Fat necrosis of breast: Secondary | ICD-10-CM | POA: Insufficient documentation

## 2022-07-31 NOTE — Progress Notes (Signed)
Patient ID: Tara Davila, female    DOB: [birth year], 41 y.o.   MRN: [id number]   Chief Complaint  Patient presents with   Consult   Breast Problem    Mammary Hyperplasia: The patient is a 41 y.o. female with a history of mammary hyperplasia for several years.  She has extremely large breasts causing symptoms that include the following: Back pain in the upper and lower back, including neck pain. She pulls or pins her bra straps to provide better lift and relief of the pressure and pain. She notices relief by holding her breast up manually.  Her shoulder straps cause grooves and pain and pressure that requires padding for relief. Pain medication is sometimes required with motrin and tylenol.  Activities that are hindered by enlarged breasts include: exercise and running.  She has tried supportive clothing as well as fitted bras without improvement.  Her breasts are extremely large and fairly symmetric.  She has hyperpigmentation of the inframammary area on both sides.  The sternal to nipple distance on the right is 30 cm and the left is 30 cm.  She is 5 feet 5 inches tall and weighs 183 pounds.  The BMI = 30.5 kg/m.  Preoperative bra size = G cup. She would like to be a C/D cup. The estimated excess breast tissue to be removed at the time of surgery = 575-600 grams on the left and 575-600 grams on the right.  Mammogram history: 8/23 negative at [facility].  Family history of breast cancer:  yes.  Tobacco use:  none.   The patient expresses the desire to pursue surgical intervention.     Review of Systems  Constitutional:  Positive for activity change.  Eyes: Negative.   Respiratory: Negative.  Negative for chest tightness.   Cardiovascular: Negative.   Gastrointestinal: Negative.   Endocrine: Negative.   Genitourinary: Negative.   Musculoskeletal:  Positive for back pain and neck pain.  Skin:  Positive for rash.  Psychiatric/Behavioral: Negative.      Past Medical History:   Diagnosis Date   Allergy    History of chicken pox    Hx: UTI (urinary tract infection)     Past Surgical History:  Procedure Laterality Date   BREAST BIOPSY Left 12/24/2021   left breast stereo x clip neg   WISDOM TOOTH EXTRACTION        Current Outpatient Medications:    albuterol (VENTOLIN HFA) 108 (90 Base) MCG/ACT inhaler, Inhale 2 puffs into the lungs as needed., Disp: , Rfl:    buPROPion (WELLBUTRIN XL) 150 MG 24 hr tablet, TAKE 1 TABLET BY MOUTH EVERY DAY, Disp: 90 tablet, Rfl: 1   cetirizine (ZYRTEC) 10 MG tablet, Take 1 tablet by mouth in the morning and at bedtime., Disp: , Rfl:    mometasone (ELOCON) 0.1 % cream, Apply 1 application topically as needed., Disp: , Rfl:    montelukast (SINGULAIR) 10 MG tablet, Take 1 tablet by mouth daily., Disp: , Rfl:    Objective:   Vitals:   07/31/22 1327  BP: 108/76  Pulse: (!) 109  SpO2: 97%    Physical Exam Vitals and nursing note reviewed.  Constitutional:      Appearance: Normal appearance.  HENT:     Head: Normocephalic and atraumatic.  Cardiovascular:     Rate and Rhythm: Normal rate.     Pulses: Normal pulses.  Pulmonary:     Effort: Pulmonary effort is normal.  Abdominal:  General: There is no distension.     Palpations: Abdomen is soft.     Tenderness: There is no abdominal tenderness.  Musculoskeletal:        General: No swelling or deformity.  Skin:    General: Skin is warm.     Capillary Refill: Capillary refill takes less than 2 seconds.     Coloration: Skin is not jaundiced.     Findings: No bruising.  Neurological:     Mental Status: She is alert and oriented to person, place, and time.  Psychiatric:        Mood and Affect: Mood normal.        Behavior: Behavior normal.        Thought Content: Thought content normal.        Judgment: Judgment normal.     Assessment & Plan:  Breast nodule  Symptomatic mammary hypertrophy  Chronic bilateral thoracic back pain  The procedure the  patient selected and that was best for the patient was discussed. The risk were discussed and include but not limited to the following:  Breast asymmetry, fluid accumulation, firmness of the breast, inability to breast feed, loss of nipple or areola, skin loss, change in skin and nipple sensation, fat necrosis of the breast tissue, bleeding, infection and healing delay.  There are risks of anesthesia and injury to nerves or blood vessels.  Allergic reaction to tape, suture and skin glue are possible.  There will be swelling.  Any of these can lead to the need for revisional surgery.  A breast reduction has potential to interfere with diagnostic procedures in the future.  This procedure is best done when the breast is fully developed.  Changes in the breast will continue to occur over time: pregnancy, weight gain or weigh loss.    Total time: 40 minutes. This includes time spent with the patient during the visit as well as time spent before and after the visit reviewing the chart, documenting the encounter, ordering pertinent studies and literature for the patient.   Physical therapy:  ordered Mammogram:  done  Patient is a good candidate for bilateral breast reduction.  We will go ahead and set her up for physical therapy and have her follow-up in 6 weeks.  We will plan on bilateral breast reduction with possible liposuction.  We will also review her films as she has a fibroid on the left breast we may be able to remove it at the same time.  Pictures were obtained of the patient and placed in the chart with the patient's or guardian's permission.   [place], DO

## 2022-08-05 ENCOUNTER — Telehealth: Payer: Self-pay | Admitting: Plastic Surgery

## 2022-08-05 NOTE — Telephone Encounter (Signed)
Pt is calling in wanting to see if she can get her PT referral sent to somewhere in [place] due to the [place] office is a distance for her to go.  Pt would like to have a call back.

## 2022-08-06 NOTE — Addendum Note (Signed)
Addended by: Harl Bowie on: 08/06/2022 08:31 AM   Modules accepted: Orders

## 2022-08-06 NOTE — Telephone Encounter (Signed)
Called patient and let her know the referral was sent to [facility] and gave her the number to call at her convenience.

## 2022-08-09 ENCOUNTER — Encounter: Payer: Self-pay | Admitting: Internal Medicine

## 2022-08-09 DIAGNOSIS — Z713 Dietary counseling and surveillance: Secondary | ICD-10-CM | POA: Insufficient documentation

## 2022-08-09 NOTE — Assessment & Plan Note (Signed)
Overall appears to be handling things relatively well.  Follow.   

## 2022-08-09 NOTE — Assessment & Plan Note (Signed)
Doing well with nutrisystem.  Exercising.  Follow.

## 2022-08-09 NOTE — Assessment & Plan Note (Signed)
Saw Dr Bary Castilla as outlined.

## 2022-08-13 ENCOUNTER — Ambulatory Visit: Payer: BC Managed Care – PPO | Attending: Plastic Surgery | Admitting: Physical Therapy

## 2022-08-13 DIAGNOSIS — N63 Unspecified lump in unspecified breast: Secondary | ICD-10-CM | POA: Insufficient documentation

## 2022-08-13 DIAGNOSIS — G8929 Other chronic pain: Secondary | ICD-10-CM | POA: Diagnosis not present

## 2022-08-13 DIAGNOSIS — M546 Pain in thoracic spine: Secondary | ICD-10-CM | POA: Diagnosis present

## 2022-08-13 DIAGNOSIS — N62 Hypertrophy of breast: Secondary | ICD-10-CM | POA: Insufficient documentation

## 2022-08-13 NOTE — Therapy (Signed)
OUTPATIENT PHYSICAL THERAPY SHOULDER EVALUATION   Patient Name: Tara Davila MRN: [id number] DOB:03/15/1981, 41 y.o., female Today's Date: 08/14/2022   PT End of Session - 08/13/22 0919     Visit Number 1    Number of Visits 17    Date for PT Re-Evaluation 10/16/22    Authorization - Visit Number 1    Progress Note Due on Visit 10    PT Start Time 0915    PT Stop Time 1000    PT Time Calculation (min) 45 min    Activity Tolerance Patient tolerated treatment well    Behavior During Therapy [facility] for tasks assessed/performed             Past Medical History:  Diagnosis Date   Allergy    History of chicken pox    Hx: UTI (urinary tract infection)    Past Surgical History:  Procedure Laterality Date   BREAST BIOPSY Left 12/24/2021   left breast stereo x clip neg   WISDOM TOOTH EXTRACTION     Patient Active Problem List   Diagnosis Date Noted   Weight loss counseling, encounter for 08/09/2022   Symptomatic mammary hypertrophy 07/31/2022   Back pain 07/31/2022   Great toe pain 08/10/2021   BMI 29.0-29.9,adult 04/21/2021   Weight gain 10/12/2020   Abnormal CXR 10/12/2020   Breast nodule 08/18/2020   Hives 01/30/2020   Irritable bowel syndrome with both constipation and diarrhea 01/30/2020   Abnormal mammogram 01/08/2020   Burping 01/07/2020   Nasal congestion 01/07/2020   Difficulty sleeping 05/31/2016   Health care maintenance 03/31/2015   Soft tissue mass 07/27/2014   Gluteal pain 06/27/2014   Stress 06/27/2014   Uses birth control 06/27/2014    PCP: Einar Pheasant MD  REFERRING PROVIDER: Audelia Hives DO  REFERRING DIAG: Thoracic pain   THERAPY DIAG:  Pain in thoracic spine - Plan: PT plan of care cert/re-cert  Rationale for Evaluation and Treatment Rehabilitation  ONSET DATE: years  SUBJECTIVE:                                                                                                                                                                                       SUBJECTIVE STATEMENT: Upper back pain - contemplating breast reduction   PERTINENT HISTORY: Pt is a 41 year female presenting with upper back pain for "years" contemplating breast reduction to help (Dr. Marla Roe) with this. Has tried chiropractic care, and stretching at home for her pain. Reports pain is between shoulder blades across middle of upper back. Pt reports her pain is achy, and feels tight like "I need to pop my back" after an hour  of shopping or working. Her pain is exacerbated by trying to lay down to sleep. Current and best pain 1/10; worst 4/10. Denies numbness/tingling. 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 Reports some pain relief from foam rolling and stretching. Pt denies N/V, B&B changes, unexplained weight fluctuation, saddle paresthesia, fever, night sweats, or unrelenting night pain at this time.   PAIN:  Are you having pain? Yes: NPRS scale: 1/10 Pain location: Mid back between shoulder blades Pain description: tight; like I need to "pop it" Aggravating factors: laying down to sleep, being up on her feet Relieving factors: trying to "pop my back", stretching  PRECAUTIONS: None  WEIGHT BEARING RESTRICTIONS No  FALLS:  Has patient fallen in last 6 months? No  LIVING ENVIRONMENT: Lives with: lives with their spouse and lives with their son Lives in: House/apartment Stairs: Yes: External: 5 steps; bilateral but cannot reach both Has following equipment at home: None  OCCUPATION: Full time; academia [employer]  PLOF: Independent  PATIENT GOALS decrease pain   OBJECTIVE:   DIAGNOSTIC FINDINGS:  None to date  PATIENT SURVEYS:  FOTO 56 goal 39  COGNITION:  Overall cognitive status: Within functional limits for tasks assessed     SENSATION: WFL  POSTURE: Forward head, rounded  shoulders, increased thoracic kyphosis  Observation: scapular dyskinesis with overhead motion with excessive upward rotaiton and elevation throughout  UPPER EXTREMITY ROM:    Thoracolumbar motions WNL with pain relief with full rotation  Cervical motions painfree WNL  Active /PROM Right eval Left eval  Shoulder flexion WNL WNL  Shoulder extension WNL WNL  Shoulder abduction WNL WNL  Shoulder adduction    Shoulder internal rotation WNL WNL  Shoulder external rotation WNL WNL  Elbow flexion WNL WNL  Elbow extension WNL WNL  (Blank rows = not tested)  UPPER EXTREMITY MMT:  MMT Right eval Left eval  Shoulder flexion 5 5  Shoulder extension 5 5  Shoulder abduction 5 5  Shoulder adduction 5 5  Shoulder internal rotation 5 5  Shoulder external rotation 5 5  Middle trapezius 4 4  Lower trapezius 4- 4-  Elbow flexion    Elbow extension    Wrist flexion    Wrist extension    Wrist ulnar deviation    Wrist radial deviation    Wrist pronation    Wrist supination    Grip strength (lbs)    (Blank rows = not tested)  SHOULDER SPECIAL TESTS:  Impingement tests: Neer impingement test: negative, Hawkins/Kennedy impingement test: negative, Painful arc test: negative, and O' Briens test: negative   JOINT MOBILITY TESTING:  WNL bilat shoulder and scapular mobility Hypomobile with pain CTJ-T10  PALPATION:  TTP with concordant pain to palpation over mid traps over rhomboid group with palpable tension TTP with trigger points to bilat pec minor, UT, suboccipitals   TODAY'S TREATMENT:  PT reviewed the following HEP with patient with patient able to demonstrate a set of the following with min cuing for correction needed. PT educated patient on parameters of therex (how/when to inc/decrease intensity, frequency, rep/set range, stretch hold time, and purpose of therex) with verbalized understanding.   Access Code: [email or web address] - Seated Scapular Retraction  - 8 x daily - 7 x weekly - 12  reps - 2sec hold - Seated Thoracic Lumbar Extension with Pectoralis Stretch  -  3-8 x daily - 7 x weekly - 12 reps - 2sec hold - Doorway Pec Stretch at 90 Degrees Abduction  - 3-8 x daily - 7 x weekly - 30-60sec hold   PATIENT EDUCATION: Education details: Patient was educated on diagnosis, anatomy and pathology involved, prognosis, role of PT, and was given an HEP, demonstrating exercise with proper form following verbal and tactile cues, and was given a paper hand out to continue exercise at home. Pt was educated on and agreed to plan of care. Person educated: Patient Education method: Explanation, Demonstration, and Verbal cues Education comprehension: verbalized understanding, returned demonstration, and verbal cues required   HOME EXERCISE PROGRAM: [email or web address]  ASSESSMENT:  CLINICAL IMPRESSION: Patient is a 41 y.o. female who was seen today for physical therapy evaluation and treatment for thoracic pain. Pain most likely attributed to postural habit, abnormal length/tension relationships; negative shoulder impingement tests bilat. Impairments in increased tension of suboccipitals, pec minor, and mid trap/rhomboid group, decreased thoracic mobility, decreased periscapular strength, abnormal posture, scapular dyskinesis with overhead mobility and pain. Activity limitations in prolonged walking, standing, carrying, lifting; inhibiting full participation in [place]. Would benefit from skilled PT to address above deficits and promote optimal return to PLOF.     OBJECTIVE IMPAIRMENTS Abnormal gait, decreased activity tolerance, decreased coordination, decreased endurance, decreased mobility, difficulty walking, decreased ROM, decreased strength, increased fascial restrictions, increased muscle spasms, impaired flexibility, impaired tone, impaired UE functional use, and improper body mechanics.   ACTIVITY LIMITATIONS carrying, lifting, standing, squatting, sleeping, transfers, reach over head, and  locomotion level  PARTICIPATION LIMITATIONS: cleaning, laundry, shopping, community activity, and occupation  [place], Past/current experiences, Profession, Sex, Time since onset of injury/illness/exacerbation, and 1 comorbidity: chronic LBP  are also affecting patient's functional outcome.   REHAB POTENTIAL: Good  CLINICAL DECISION MAKING: Evolving/moderate complexity  EVALUATION COMPLEXITY: Moderate   GOALS: Goals reviewed with patient? Yes  SHORT TERM GOALS: Target date: 09/11/2022  (Remove Blue Hyperlink)  Pt will be independent with HEP in order to improve strength and balance in order to decrease fall risk and improve function at home and work.  Baseline: HEP given  Goal status: INITIAL  LONG TERM GOALS: Target date: 10/09/2022  (Remove Blue Hyperlink)  Patient will increase FOTO score to 70 to demonstrate predicted increase in functional mobility to complete ADLs Baseline: 56 Goal status: INITIAL  2.  Pt will decrease worst pain as reported on NPRS by at least 3 points in order to demonstrate clinically significant reduction in pain.  Baseline: 5/10 Goal status: INITIAL  3.  Pt will demonstrate gross 4/+5 periscapular strength in order to complete heavy household ADLs Baseline: see eval Goal status: INITIAL     PLAN: PT FREQUENCY: 1-2x/week  PT DURATION: 8 weeks  PLANNED INTERVENTIONS: Therapeutic exercises, Therapeutic activity, Neuromuscular re-education, Balance training, Gait training, Patient/Family education, Self Care, Joint mobilization, Joint manipulation, Dry Needling, Spinal manipulation, Cryotherapy, Moist heat, Taping, Traction, Ultrasound, Fluidotherapy, Manual therapy, and Re-evaluation  PLAN FOR NEXT SESSION: HEP review  Durwin Reges DPT Durwin Reges, PT 08/14/2022, 11:16 AM

## 2022-08-14 ENCOUNTER — Encounter: Payer: Self-pay | Admitting: Physical Therapy

## 2022-08-20 ENCOUNTER — Ambulatory Visit: Payer: BC Managed Care – PPO

## 2022-08-20 DIAGNOSIS — M546 Pain in thoracic spine: Secondary | ICD-10-CM

## 2022-08-20 NOTE — Therapy (Addendum)
OUTPATIENT PHYSICAL THERAPY SHOULDER TREATMENT   Patient Name: Tara Davila MRN: [id number] DOB:02/12/1981, 41 y.o., female Today's Date: 08/20/2022   PT End of Session - 08/20/22 1015     Visit Number 2    Number of Visits 17    Date for PT Re-Evaluation 10/16/22    Authorization Type [employer]    Progress Note Due on Visit 10    PT Start Time 1002    PT Stop Time 1042    PT Time Calculation (min) 40 min    Activity Tolerance Patient tolerated treatment well    Behavior During Therapy [facility] for tasks assessed/performed             Past Medical History:  Diagnosis Date   Allergy    History of chicken pox    Hx: UTI (urinary tract infection)    Past Surgical History:  Procedure Laterality Date   BREAST BIOPSY Left 12/24/2021   left breast stereo x clip neg   WISDOM TOOTH EXTRACTION     Patient Active Problem List   Diagnosis Date Noted   Weight loss counseling, encounter for 08/09/2022   Symptomatic mammary hypertrophy 07/31/2022   Back pain 07/31/2022   Great toe pain 08/10/2021   BMI 29.0-29.9,adult 04/21/2021   Weight gain 10/12/2020   Abnormal CXR 10/12/2020   Breast nodule 08/18/2020   Hives 01/30/2020   Irritable bowel syndrome with both constipation and diarrhea 01/30/2020   Abnormal mammogram 01/08/2020   Burping 01/07/2020   Nasal congestion 01/07/2020   Difficulty sleeping 05/31/2016   Health care maintenance 03/31/2015   Soft tissue mass 07/27/2014   Gluteal pain 06/27/2014   Stress 06/27/2014   Uses birth control 06/27/2014    PCP: Einar Pheasant MD  REFERRING PROVIDER: Audelia Hives DO  REFERRING DIAG: Thoracic pain   THERAPY DIAG:  Pain in thoracic spine  Rationale for Evaluation and Treatment Rehabilitation  ONSET DATE: years  SUBJECTIVE:                                                                                                                                                                                      SUBJECTIVE STATEMENT: Started HEP without issue at home, just wants to make sure she is consistent with them. She found the best chair for T spine extension at work, none at home.    PERTINENT HISTORY: Pt is a 41 year female presenting with upper back pain for "years" contemplating breast reduction to help (Dr. Marla Roe) with this. Has tried chiropractic care, and stretching at home for her pain. Reports pain is between shoulder blades across middle of upper back. Pt  reports her pain is achy, and feels tight like "I need to pop my back" after an hour of shopping or working. Her pain is exacerbated by trying to lay down to sleep. Current and best pain 1/10; worst 4/10. Denies numbness/tingling. 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 Reports some pain relief from foam rolling and stretching. Pt denies N/V, B&B changes, unexplained weight fluctuation, saddle paresthesia, fever, night sweats, or unrelenting night pain at this time.   PAIN:  Are you having pain? No 0/10     TODAY'S TREATMENT:  -Seated scapular squeeze 1x12x2secH  -Seated Thoracic Lumbar Extension with Pectoralis Stretch x5 -Gait belt resisted T6 extension mobilization c BUE push x15 -doorway pec stretch, double and single 2x30sec bilat  -seated cable resisted row 1x12 @ 25lb (multimodal cues for scapular rhythm and retraction)  -seated PVC cleans 1x15 @ 5lb, cues for proprioception of scapular depression  -seated cable resisted row 1x12 @ 25lb (multimodal cues for scapular rhythm and retraction)  -manual triggerpoint release on Left rhomboids x3, Right rhomboids x2, Right rhomboidish paraspinals x1: good palpable deflation on all accounts    Access Code: [email or web address] - Seated Scapular Retraction  - 8 x daily - 7 x weekly - 12 reps - 2sec hold - Seated Thoracic Lumbar  Extension with Pectoralis Stretch  - 3-8 x daily - 7 x weekly - 12 reps - 2sec hold - Doorway Pec Stretch at 90 Degrees Abduction  - 3-8 x daily - 7 x weekly - 30-60sec hold   PATIENT EDUCATION: Education details: Patient was educated on diagnosis, anatomy and pathology involved, prognosis, role of PT, and was given an HEP, demonstrating exercise with proper form following verbal and tactile cues, and was given a paper hand out to continue exercise at home. Pt was educated on and agreed to plan of care. Person educated: Patient Education method: Explanation, Demonstration, and Verbal cues Education comprehension: verbalized understanding, returned demonstration, and verbal cues required   HOME EXERCISE PROGRAM: [email or web address]  ASSESSMENT:  CLINICAL IMPRESSION: Reviewed HEP. Jumped off that into similar more aggressive activities in clinic. Would benefit from skilled PT to address above deficits and promote optimal return to PLOF.     OBJECTIVE IMPAIRMENTS Abnormal gait, decreased activity tolerance, decreased coordination, decreased endurance, decreased mobility, difficulty walking, decreased ROM, decreased strength, increased fascial restrictions, increased muscle spasms, impaired flexibility, impaired tone, impaired UE functional use, and improper body mechanics.   ACTIVITY LIMITATIONS carrying, lifting, standing, squatting, sleeping, transfers, reach over head, and locomotion level  PARTICIPATION LIMITATIONS: cleaning, laundry, shopping, community activity, and occupation  [place], Past/current experiences, Profession, Sex, Time since onset of injury/illness/exacerbation, and 1 comorbidity: chronic LBP  are also affecting patient's functional outcome.   REHAB POTENTIAL: Good  CLINICAL DECISION MAKING: Evolving/moderate complexity  EVALUATION COMPLEXITY: Moderate   GOALS: Goals reviewed with patient? Yes  SHORT TERM GOALS: Target date: 09/17/2022   Pt will be  independent with HEP in order to improve strength and balance in order to decrease fall risk and improve function at home and work.  Baseline: HEP given  Goal status: INITIAL  LONG TERM GOALS: Target date: 10/15/2022   Patient will increase FOTO score to 70 to demonstrate predicted increase in functional mobility to complete ADLs Baseline: 56 Goal status: INITIAL  2.  Pt will decrease worst  pain as reported on NPRS by at least 3 points in order to demonstrate clinically significant reduction in pain.  Baseline: 5/10 Goal status: INITIAL  3.  Pt will demonstrate gross 4/+5 periscapular strength in order to complete heavy household ADLs Baseline: see eval Goal status: INITIAL     PLAN: PT FREQUENCY: 1-2x/week  PT DURATION: 8 weeks  PLANNED INTERVENTIONS: Therapeutic exercises, Therapeutic activity, Neuromuscular re-education, Balance training, Gait training, Patient/Family education, Self Care, Joint mobilization, Joint manipulation, Dry Needling, Spinal manipulation, Cryotherapy, Moist heat, Taping, Traction, Ultrasound, Fluidotherapy, Manual therapy, and Re-evaluation  PLAN FOR NEXT SESSION: HEP review   10:24 AM, 08/20/22 Etta Grandchild, PT, DPT Physical Therapist - [place] [phone] (Office)   Yamila Cragin C, PT 08/20/2022, 10:22 AM

## 2022-08-28 ENCOUNTER — Ambulatory Visit: Payer: BC Managed Care – PPO | Attending: Plastic Surgery | Admitting: Physical Therapy

## 2022-08-28 ENCOUNTER — Encounter: Payer: Self-pay | Admitting: Physical Therapy

## 2022-08-28 DIAGNOSIS — M546 Pain in thoracic spine: Secondary | ICD-10-CM | POA: Diagnosis present

## 2022-08-28 NOTE — Therapy (Signed)
OUTPATIENT PHYSICAL THERAPY SHOULDER TREATMENT   Patient Name: Tara Davila MRN: [id number] DOB:12/11/1980, 41 y.o., female Today's Date: 08/28/2022   PT End of Session - 08/28/22 1007     Visit Number 3    Number of Visits 17    Date for PT Re-Evaluation 10/16/22    Authorization Type [employer]    Authorization - Visit Number 2    Progress Note Due on Visit 10    PT Start Time 1004    PT Stop Time 1042    PT Time Calculation (min) 38 min    Activity Tolerance Patient tolerated treatment well    Behavior During Therapy WFL for tasks assessed/performed              Past Medical History:  Diagnosis Date   Allergy    History of chicken pox    Hx: UTI (urinary tract infection)    Past Surgical History:  Procedure Laterality Date   BREAST BIOPSY Left 12/24/2021   left breast stereo x clip neg   WISDOM TOOTH EXTRACTION     Patient Active Problem List   Diagnosis Date Noted   Weight loss counseling, encounter for 08/09/2022   Symptomatic mammary hypertrophy 07/31/2022   Back pain 07/31/2022   Great toe pain 08/10/2021   BMI 29.0-29.9,adult 04/21/2021   Weight gain 10/12/2020   Abnormal CXR 10/12/2020   Breast nodule 08/18/2020   Hives 01/30/2020   Irritable bowel syndrome with both constipation and diarrhea 01/30/2020   Abnormal mammogram 01/08/2020   Burping 01/07/2020   Nasal congestion 01/07/2020   Difficulty sleeping 05/31/2016   Health care maintenance 03/31/2015   Soft tissue mass 07/27/2014   Gluteal pain 06/27/2014   Stress 06/27/2014   Uses birth control 06/27/2014    PCP: Einar Pheasant MD  REFERRING PROVIDER: Audelia Hives DO  REFERRING DIAG: Thoracic pain   THERAPY DIAG:  Pain in thoracic spine  Rationale for Evaluation and Treatment Rehabilitation  ONSET DATE: years  SUBJECTIVE:                                                                                                                                                                                      SUBJECTIVE STATEMENT: Pt reports she felt so much better following last session, and that she feels better overall. Reports no pain overall. Enjoyed manual techniques from last session.   PERTINENT HISTORY: Pt is a 41 year female presenting with upper back pain for "years" contemplating breast reduction to help (Dr. Marla Roe) with this. Has tried chiropractic care, and stretching at home for her pain. Reports pain is between shoulder blades across middle  of upper back. Pt reports her pain is achy, and feels tight like "I need to pop my back" after an hour of shopping or working. Her pain is exacerbated by trying to lay down to sleep. Current and best pain 1/10; worst 4/10. Denies numbness/tingling. 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 Reports some pain relief from foam rolling and stretching. Pt denies N/V, B&B changes, unexplained weight fluctuation, saddle paresthesia, fever, night sweats, or unrelenting night pain at this time.   PAIN:  Are you having pain? No 0/10     TODAY'S TREATMENT:  -Seated Thoracic Lumbar Extension with overhead dowel x12 - Prone thoracic ext with hands behind head 2x 10 with good carry over initial cuing/demo - OMEGA standing rows 25# 2x 10  Post shoulder rolls x15 with focus on retraction + depression -doorway pec stretch, double and single 2x30sec bilat    -manual triggerpoint release on Left rhomboids x3, Right rhomboids x2, Right rhomboidish paraspinals x1: good palpable deflation on all accounts    Access Code: [email or web address] - Seated Scapular Retraction  - 8 x daily - 7 x weekly - 12 reps - 2sec hold - Seated Thoracic Lumbar Extension with Pectoralis Stretch  - 3-8 x daily - 7 x weekly - 12 reps - 2sec hold - Doorway Pec Stretch at 90 Degrees Abduction  - 3-8 x  daily - 7 x weekly - 30-60sec hold   PATIENT EDUCATION: Education details: Patient was educated on diagnosis, anatomy and pathology involved, prognosis, role of PT, and was given an HEP, demonstrating exercise with proper form following verbal and tactile cues, and was given a paper hand out to continue exercise at home. Pt was educated on and agreed to plan of care. Person educated: Patient Education method: Explanation, Demonstration, and Verbal cues Education comprehension: verbalized understanding, returned demonstration, and verbal cues required   HOME EXERCISE PROGRAM: [email or web address]  ASSESSMENT:  CLINICAL IMPRESSION: PT continued therex progression for increased periscapular activation and pec minor/major lengthening with success. Patient is able to comply with all cuing/demo for proper technique of therex with good effort throughout session. Pt continues to respond well to manual trigger points release. PT will continue progression as able.      OBJECTIVE IMPAIRMENTS Abnormal gait, decreased activity tolerance, decreased coordination, decreased endurance, decreased mobility, difficulty walking, decreased ROM, decreased strength, increased fascial restrictions, increased muscle spasms, impaired flexibility, impaired tone, impaired UE functional use, and improper body mechanics.   ACTIVITY LIMITATIONS carrying, lifting, standing, squatting, sleeping, transfers, reach over head, and locomotion level  PARTICIPATION LIMITATIONS: cleaning, laundry, shopping, community activity, and occupation  [place], Past/current experiences, Profession, Sex, Time since onset of injury/illness/exacerbation, and 1 comorbidity: chronic LBP  are also affecting patient's functional outcome.   REHAB POTENTIAL: Good  CLINICAL DECISION MAKING: Evolving/moderate complexity  EVALUATION COMPLEXITY: Moderate   GOALS: Goals reviewed with patient? Yes  SHORT TERM GOALS: Target date: 09/17/2022    Pt will be independent with HEP in order to improve strength and balance in order to decrease fall risk and improve function at home and work.  Baseline: HEP given  Goal status: INITIAL  LONG TERM GOALS: Target date: 10/15/2022   Patient will increase FOTO score to 70 to demonstrate predicted increase in functional mobility to complete ADLs Baseline: 56 Goal status: INITIAL  2.  Pt will decrease worst pain as reported on NPRS by at least 3 points in order to demonstrate clinically significant reduction in pain.  Baseline: 5/10 Goal status: INITIAL  3.  Pt will demonstrate gross 4/+5 periscapular strength in order to complete heavy household ADLs Baseline: see eval Goal status: INITIAL     PLAN: PT FREQUENCY: 1-2x/week  PT DURATION: 8 weeks  PLANNED INTERVENTIONS: Therapeutic exercises, Therapeutic activity, Neuromuscular re-education, Balance training, Gait training, Patient/Family education, Self Care, Joint mobilization, Joint manipulation, Dry Needling, Spinal manipulation, Cryotherapy, Moist heat, Taping, Traction, Ultrasound, Fluidotherapy, Manual therapy, and Re-evaluation  PLAN FOR NEXT SESSION: HEP review   10:43 AM, 08/28/22 Etta Grandchild, PT, DPT Physical Therapist - [employer] [phone] (Office)   Durwin Reges, PT 08/28/2022, 10:43 AM

## 2022-09-04 ENCOUNTER — Ambulatory Visit: Payer: BC Managed Care – PPO | Admitting: Physical Therapy

## 2022-09-04 ENCOUNTER — Encounter: Payer: Self-pay | Admitting: Physical Therapy

## 2022-09-04 DIAGNOSIS — M546 Pain in thoracic spine: Secondary | ICD-10-CM

## 2022-09-04 NOTE — Therapy (Signed)
OUTPATIENT PHYSICAL THERAPY SHOULDER TREATMENT   Patient Name: Tara Davila MRN: [id number] DOB:1981-11-05, 41 y.o., female Today's Date: 09/04/2022   PT End of Session - 09/04/22 0838     Visit Number 4    Number of Visits 17    Date for PT Re-Evaluation 10/16/22    Authorization Type [employer]    Authorization - Visit Number 4    Progress Note Due on Visit 10    PT Start Time [id number]    PT Stop Time 0913    PT Time Calculation (min) 38 min    Activity Tolerance Patient tolerated treatment well    Behavior During Therapy [facility] for tasks assessed/performed               Past Medical History:  Diagnosis Date   Allergy    History of chicken pox    Hx: UTI (urinary tract infection)    Past Surgical History:  Procedure Laterality Date   BREAST BIOPSY Left 12/24/2021   left breast stereo x clip neg   WISDOM TOOTH EXTRACTION     Patient Active Problem List   Diagnosis Date Noted   Weight loss counseling, encounter for 08/09/2022   Symptomatic mammary hypertrophy 07/31/2022   Back pain 07/31/2022   Great toe pain 08/10/2021   BMI 29.0-29.9,adult 04/21/2021   Weight gain 10/12/2020   Abnormal CXR 10/12/2020   Breast nodule 08/18/2020   Hives 01/30/2020   Irritable bowel syndrome with both constipation and diarrhea 01/30/2020   Abnormal mammogram 01/08/2020   Burping 01/07/2020   Nasal congestion 01/07/2020   Difficulty sleeping 05/31/2016   Health care maintenance 03/31/2015   Soft tissue mass 07/27/2014   Gluteal pain 06/27/2014   Stress 06/27/2014   Uses birth control 06/27/2014    PCP: Einar Pheasant MD  REFERRING PROVIDER: Audelia Hives DO  REFERRING DIAG: Thoracic pain   THERAPY DIAG:  Pain in thoracic spine  Rationale for Evaluation and Treatment Rehabilitation  ONSET DATE: years  SUBJECTIVE:                                                                                                                                                                                      SUBJECTIVE STATEMENT: Pt reports she has been having more pain this week 5/10 on NPRS d/t being on her feet more at work. Has had to take tylenol because of pain. Completing HEP without difficulty  PERTINENT HISTORY: Pt is a 41 year female presenting with upper back pain for "years" contemplating breast reduction to help (Dr. Marla Roe) with this. Has tried chiropractic care, and stretching at home for her pain. Reports  pain is between shoulder blades across middle of upper back. Pt reports her pain is achy, and feels tight like "I need to pop my back" after an hour of shopping or working. Her pain is exacerbated by trying to lay down to sleep. Current and best pain 1/10; worst 4/10. Denies numbness/tingling. 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 Reports some pain relief from foam rolling and stretching. Pt denies N/V, B&B changes, unexplained weight fluctuation, saddle paresthesia, fever, night sweats, or unrelenting night pain at this time.   PAIN:  Are you having pain? No 0/10     TODAY'S TREATMENT:  Nustep seat 7 UE 12 L3 1mns for gentle protraction/retraction motion and strengthening Sidelying open book x6 each side with cuing for breath control with good carry over Cat Cow x12 with good carry over of initial cuing for technique with breath control Seated thoracic ext with hands behind head (towel at tspine) x12with cuing for breath control  Theraball forward rolls blue ball x12; childs pose stretch x30sec with good carry over of breath control  Post shoulder rolls x15 with focus on retraction + depression    Manual -manual triggerpoint release on Left rhomboids x3, Right rhomboids x2, Right rhomboidish paraspinals x1: good palpable deflation on all accounts Following: Dry Needling: (2/2/2)  354m.25 needles placed along the bilat UT and L levator scapulae decrease increased muscular spasms and trigger points with the patient positioned in prone. Patient was educated on risks and benefits of therapy and verbally consents to PT.         PATIENT EDUCATION: Education details: Patient was educated on diagnosis, anatomy and pathology involved, prognosis, role of PT, and was given an HEP, demonstrating exercise with proper form following verbal and tactile cues, and was given a paper hand out to continue exercise at home. Pt was educated on and agreed to plan of care. Person educated: Patient Education method: Explanation, Demonstration, and Verbal cues Education comprehension: verbalized understanding, returned demonstration, and verbal cues required   HOME EXERCISE PROGRAM: Access Code: [email or web address] - Seated Scapular Retraction  - 8 x daily - 7 x weekly - 12 reps - 2sec hold - Seated Thoracic Lumbar Extension with Pectoralis Stretch  - 3-8 x daily - 7 x weekly - 12 reps - 2sec hold - Doorway Pec Stretch at 90 Degrees Abduction  - 3-8 x daily - 7 x weekly - 30-60sec hold  ASSESSMENT:  CLINICAL IMPRESSION: PT initiated TPDN with manual techniques for pain and tension relief with success. Pt with localized twitch response and palpable decrease of trigger points. PT lead pt in therex for increased thoracic and scapular mobility with aptient able to comply with all cuing for proper technique of therex with good effort throughout session. PT reports 0/10 pain at conclusion of session (5/10 prior). PT will continue progression as able.      OBJECTIVE IMPAIRMENTS Abnormal gait, decreased activity tolerance, decreased coordination, decreased endurance, decreased mobility, difficulty walking, decreased ROM, decreased strength, increased fascial restrictions, increased muscle spasms, impaired flexibility, impaired tone, impaired UE functional use, and improper body mechanics.   ACTIVITY  LIMITATIONS carrying, lifting, standing, squatting, sleeping, transfers, reach over head, and locomotion level  PARTICIPATION LIMITATIONS: cleaning, laundry, shopping, community activity, and occupation  [place], Profession, Sex, Time since onset of injury/illness/exacerbation, and  1 comorbidity: chronic LBP  are also affecting patient's functional outcome.   REHAB POTENTIAL: Good  CLINICAL DECISION MAKING: Evolving/moderate complexity  EVALUATION COMPLEXITY: Moderate   GOALS: Goals reviewed with patient? Yes  SHORT TERM GOALS: Target date: 09/17/2022   Pt will be independent with HEP in order to improve strength and balance in order to decrease fall risk and improve function at home and work.  Baseline: HEP given  Goal status: INITIAL  LONG TERM GOALS: Target date: 10/15/2022   Patient will increase FOTO score to 70 to demonstrate predicted increase in functional mobility to complete ADLs Baseline: 56 Goal status: INITIAL  2.  Pt will decrease worst pain as reported on NPRS by at least 3 points in order to demonstrate clinically significant reduction in pain.  Baseline: 5/10 Goal status: INITIAL  3.  Pt will demonstrate gross 4/+5 periscapular strength in order to complete heavy household ADLs Baseline: see eval Goal status: INITIAL     PLAN: PT FREQUENCY: 1-2x/week  PT DURATION: 8 weeks  PLANNED INTERVENTIONS: Therapeutic exercises, Therapeutic activity, Neuromuscular re-education, Balance training, Gait training, Patient/Family education, Self Care, Joint mobilization, Joint manipulation, Dry Needling, Spinal manipulation, Cryotherapy, Moist heat, Taping, Traction, Ultrasound, Fluidotherapy, Manual therapy, and Re-evaluation  PLAN FOR NEXT SESSION: HEP review   Durwin Reges DPT Durwin Reges, PT 09/04/2022, 9:16 AM

## 2022-09-11 ENCOUNTER — Ambulatory Visit: Payer: BC Managed Care – PPO | Admitting: Surgical

## 2022-09-11 VITALS — Ht 65.0 in | Wt 180.0 lb

## 2022-09-11 DIAGNOSIS — N62 Hypertrophy of breast: Secondary | ICD-10-CM | POA: Diagnosis not present

## 2022-09-11 DIAGNOSIS — M546 Pain in thoracic spine: Secondary | ICD-10-CM | POA: Diagnosis not present

## 2022-09-11 DIAGNOSIS — R21 Rash and other nonspecific skin eruption: Secondary | ICD-10-CM

## 2022-09-11 DIAGNOSIS — G8929 Other chronic pain: Secondary | ICD-10-CM

## 2022-09-11 DIAGNOSIS — M25511 Pain in right shoulder: Secondary | ICD-10-CM

## 2022-09-11 DIAGNOSIS — M25512 Pain in left shoulder: Secondary | ICD-10-CM

## 2022-09-11 DIAGNOSIS — N63 Unspecified lump in unspecified breast: Secondary | ICD-10-CM

## 2022-09-11 NOTE — Progress Notes (Signed)
   Referring Provider Einar Pheasant, [address] [place],  [place] [id number]   CC:  Chief Complaint  Patient presents with   Follow-up      Tara Davila is an 41 y.o. female.  HPI: Patient is a 41 y.o. year old female here for follow up after completing physical therapy for pain related to macromastia. She reports that she has completed 5 visits of physical therapy, she was scheduled for one this week but they changed it to next week due to PT recommendations.  She reports that she has had some minor improvement, continues to have shoulder pain, upper mid back pain.  She also reports that she has history of rashes beneath the folds of her breasts as well as open sores that develop occasionally.  She reports that she is still interested in pursuing surgical intervention.  She does have an appointment next week on 09/18/2022 for her last appointment with physical therapy.  Review of Systems General: No fevers or chills  Physical Exam    09/11/2022    2:45 PM 07/31/2022    1:27 PM 07/30/2022   10:03 AM  Vitals with BMI  Height '5\' 5"'$  '5\' 5"'$  '5\' 5"'$   Weight 180 lbs 183 lbs 3 oz 184 lbs 10 oz  BMI 29.95 26.20 35.59  Systolic  [phone]  Diastolic  76 76  Pulse  453 96    General:  No acute distress,  Alert and oriented, Non-Toxic, Normal speech and affect Psych: Normal behavior and mood   Assessment/Plan  Patient is interested in pursuing surgical intervention for bilateral breast reduction. Patient has completed 5 weeks of physical therapy for pain related to macromastia, she is-year-old for her sixth visit next week.  We will plan to have a televisit next week to discuss how she is feeling after her final treatment.  We will plan to submit to insurance today and provide updated notes after completion of her sixth visit if necessary.  Discussed with patient we would submit to insurance for authorization, discussed approval could take up to 6 weeks.   Tara Davila 09/11/2022, 3:03 PM

## 2022-09-18 ENCOUNTER — Ambulatory Visit: Payer: BC Managed Care – PPO | Admitting: Physical Therapy

## 2022-09-18 ENCOUNTER — Encounter: Payer: Self-pay | Admitting: Physical Therapy

## 2022-09-18 ENCOUNTER — Ambulatory Visit (INDEPENDENT_AMBULATORY_CARE_PROVIDER_SITE_OTHER): Payer: BC Managed Care – PPO | Admitting: Surgical

## 2022-09-18 DIAGNOSIS — M546 Pain in thoracic spine: Secondary | ICD-10-CM

## 2022-09-18 DIAGNOSIS — N63 Unspecified lump in unspecified breast: Secondary | ICD-10-CM

## 2022-09-18 DIAGNOSIS — G8929 Other chronic pain: Secondary | ICD-10-CM

## 2022-09-18 DIAGNOSIS — N62 Hypertrophy of breast: Secondary | ICD-10-CM

## 2022-09-18 NOTE — Therapy (Signed)
OUTPATIENT PHYSICAL THERAPY SHOULDER TREATMENT/DC Summary Reporting Period 08/13/22 - 09/18/22   Patient Name: Tara Davila MRN: [id number] DOB:25-Oct-1981, 41 y.o., female Today's Date: 09/18/2022   PT End of Session - 09/18/22 0919     Visit Number 5    Number of Visits 17    Date for PT Re-Evaluation 10/16/22    Authorization Type [employer]    Authorization - Visit Number 5    Progress Note Due on Visit 10    PT Start Time [phone]    PT Stop Time 0957    PT Time Calculation (min) 39 min    Activity Tolerance Patient tolerated treatment well    Behavior During Therapy [facility] for tasks assessed/performed                Past Medical History:  Diagnosis Date   Allergy    History of chicken pox    Hx: UTI (urinary tract infection)    Past Surgical History:  Procedure Laterality Date   BREAST BIOPSY Left 12/24/2021   left breast stereo x clip neg   WISDOM TOOTH EXTRACTION     Patient Active Problem List   Diagnosis Date Noted   Weight loss counseling, encounter for 08/09/2022   Symptomatic mammary hypertrophy 07/31/2022   Back pain 07/31/2022   Great toe pain 08/10/2021   BMI 29.0-29.9,adult 04/21/2021   Weight gain 10/12/2020   Abnormal CXR 10/12/2020   Breast nodule 08/18/2020   Hives 01/30/2020   Irritable bowel syndrome with both constipation and diarrhea 01/30/2020   Abnormal mammogram 01/08/2020   Burping 01/07/2020   Nasal congestion 01/07/2020   Difficulty sleeping 05/31/2016   Health care maintenance 03/31/2015   Soft tissue mass 07/27/2014   Gluteal pain 06/27/2014   Stress 06/27/2014   Uses birth control 06/27/2014    PCP: Einar Pheasant MD  REFERRING PROVIDER: Audelia Hives DO  REFERRING DIAG: Thoracic pain   THERAPY DIAG:  Pain in thoracic spine  Rationale for Evaluation and Treatment Rehabilitation  ONSET DATE: years  SUBJECTIVE:                                                                                                                                                                                      SUBJECTIVE STATEMENT: Pt reports PT has been very helpful; feels 70-80% better overall. Would like to d/c PT to HEP to continue progress  PERTINENT HISTORY: Pt is a 41 year female presenting with upper back pain for "years" contemplating breast reduction to help (Dr. Marla Roe) with this. Has tried chiropractic care, and stretching at home for her pain. Reports pain is between shoulder blades  across middle of upper back. Pt reports her pain is achy, and feels tight like "I need to pop my back" after an hour of shopping or working. Her pain is exacerbated by trying to lay down to sleep. Current and best pain 1/10; worst 4/10. Denies numbness/tingling. 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 Reports some pain relief from foam rolling and stretching. Pt denies N/V, B&B changes, unexplained weight fluctuation, saddle paresthesia, fever, night sweats, or unrelenting night pain at this time.   PAIN:  Are you having pain? No 0/10     TODAY'S TREATMENT:  Nustep seat 7 UE 12 L3 91mns for gentle protraction/retraction motion and strengthening  PT reviewed the following HEP with patient with patient able to demonstrate a set of the following with min cuing for correction needed. PT educated patient on parameters of therex (how/when to inc/decrease intensity, frequency, rep/set range, stretch hold time, and purpose of therex) with verbalized understanding.   Access Code: [email or web address]- Shoulder External Rotation and Scapular Retraction with Resistance  - 1 x daily - 1-2 x weekly - 3 sets - 6-12 reps - Prone Scapular Retraction Y  - 1 x daily - 1-2 x weekly - 3 sets - 6-12 reps - Standing Shoulder Row with Anchored Resistance  - 1 x daily - 1-2 x weekly - 3 sets - 6-12  reps - Shoulder Extension with Resistance  - 1 x daily - 1-2 x weekly - 3 sets - 6-12 reps - Bent Over Single Arm Shoulder Row with Dumbbell  - 1 x daily - 1-2 x weekly - 3 sets - 6-12 reps        PATIENT EDUCATION: Education details: Patient was educated on diagnosis, anatomy and pathology involved, prognosis, role of PT, and was given an HEP, demonstrating exercise with proper form following verbal and tactile cues, and was given a paper hand out to continue exercise at home. Pt was educated on and agreed to plan of care. Person educated: Patient Education method: Explanation, Demonstration, and Verbal cues Education comprehension: verbalized understanding, returned demonstration, and verbal cues required   HOME EXERCISE PROGRAM: Access Code: [email or web address] - Seated Scapular Retraction  - 8 x daily - 7 x weekly - 12 reps - 2sec hold - Seated Thoracic Lumbar Extension with Pectoralis Stretch  - 3-8 x daily - 7 x weekly - 12 reps - 2sec hold - Doorway Pec Stretch at 90 Degrees Abduction  - 3-8 x daily - 7 x weekly - 30-60sec hold  ASSESSMENT:  CLINICAL IMPRESSION: PT reassessed goals this session where patient has met strength goals; and has made strong progress toward decreasing pain to safely d/c formal PT. Pt continues to have pain that is alleviated with manually supporting breasts. Patient reports feeling 70-80% better overall with rest of inprogress due to pain pain that subsides with manual breast support. Because of this patient remains good candidate for breast reduction, should she chose intervention. Patient is able to demonstrate and verbalize understanding of all HEP recommendations with minimal corrections needed. Pt given clinic contact info should further questions or concerns arise. Pt to d/c PT.       OBJECTIVE IMPAIRMENTS Abnormal gait, decreased activity tolerance, decreased coordination, decreased endurance, decreased mobility, difficulty walking, decreased ROM, decreased  strength, increased fascial restrictions, increased muscle spasms, impaired flexibility, impaired  tone, impaired UE functional use, and improper body mechanics.   ACTIVITY LIMITATIONS carrying, lifting, standing, squatting, sleeping, transfers, reach over head, and locomotion level  PARTICIPATION LIMITATIONS: cleaning, laundry, shopping, community activity, and occupation  [place], Past/current experiences, Profession, Sex, Time since onset of injury/illness/exacerbation, and 1 comorbidity: chronic LBP  are also affecting patient's functional outcome.   REHAB POTENTIAL: Good  CLINICAL DECISION MAKING: Evolving/moderate complexity  EVALUATION COMPLEXITY: Moderate   GOALS: Goals reviewed with patient? Yes  SHORT TERM GOALS: Target date: 09/17/2022   Pt will be independent with HEP in order to improve strength and balance in order to decrease fall risk and improve function at home and work.  Baseline: HEP given ; 09/18/22 HEP updated Goal status: deferred  LONG TERM GOALS: Target date: 10/15/2022   Patient will increase FOTO score to 70 to demonstrate predicted increase in functional mobility to complete ADLs Baseline: 56; 63 Goal status: not met  2.  Pt will decrease worst pain as reported on NPRS by at least 3 points in order to demonstrate clinically significant reduction in pain.  Baseline: 5/10; 09/18/22 3/10 Goal status: ongoing  3.  Pt will demonstrate gross 4/+5 periscapular strength in order to complete heavy household ADLs Baseline: see eval; Y 4+/5 bilat T 5/5 bilat Goal status: MET     PLAN: PT FREQUENCY: 1-2x/week  PT DURATION: 8 weeks  PLANNED INTERVENTIONS: Therapeutic exercises, Therapeutic activity, Neuromuscular re-education, Balance training, Gait training, Patient/Family education, Self Care, Joint mobilization, Joint manipulation, Dry Needling, Spinal manipulation, Cryotherapy, Moist heat, Taping, Traction, Ultrasound, Fluidotherapy,  Manual therapy, and Re-evaluation  PLAN FOR NEXT SESSION: HEP review   Durwin Reges DPT Durwin Reges, PT 09/18/2022, 11:21 AM

## 2022-09-18 NOTE — Progress Notes (Signed)
   Referring Provider Einar Pheasant, [address] [place],  [place] [address]   CC: No chief complaint on file.     Tara Davila is an 41 y.o. female.  HPI: Patient is a 40 y.o. year old female here for follow up after completing physical therapy for pain related to macromastia. She reports she has completed 6 weeks of physical therapy at this point.  She was discharged from physical therapy today.  She reports she is still having ongoing shoulder, upper mid back pain.  She is still interested in pursuing surgical intervention.  The patient gave consent to have this visit done by telemedicine / virtual visit, two identifiers were used to identify patient. This is also consent for access the chart and treat the patient via this visit. The patient is located in [place].  I, the provider, am at the office.  We spent 5 minutes together for the visit.  Joined by phone.   Review of Systems MSK: Positive shoulder pain and back pain  Physical Exam    09/11/2022    2:45 PM 07/31/2022    1:27 PM 07/30/2022   10:03 AM  Vitals with BMI  Height '5\' 5"'$  '5\' 5"'$  '5\' 5"'$   Weight 180 lbs 183 lbs 3 oz 184 lbs 10 oz  BMI 29.95 24.40 10.27  Systolic  253 664  Diastolic  76 76  Pulse  403 96     Assessment/Plan Patient is interested in pursuing surgical intervention for bilateral breast reduction. Patient has completed at least 6 weeks of physical therapy for pain related to macromastia.  Discussed with patient we would submit to insurance for authorization, discussed approval could take up to 6 weeks.   Tara Davila 09/18/2022, 10:59 AM

## 2022-10-24 ENCOUNTER — Other Ambulatory Visit: Payer: Self-pay | Admitting: Internal Medicine

## 2022-10-26 ENCOUNTER — Telehealth: Payer: Self-pay | Admitting: *Deleted

## 2022-10-26 NOTE — Telephone Encounter (Signed)
Spoke to [place] and extended dates to start 11/23/22 for 90 days

## 2022-10-30 ENCOUNTER — Ambulatory Visit: Payer: BC Managed Care – PPO | Admitting: Internal Medicine

## 2022-10-30 ENCOUNTER — Encounter: Payer: Self-pay | Admitting: Internal Medicine

## 2022-10-30 VITALS — BP 110/76 | HR 98 | Temp 98.4°F | Resp 15 | Ht 65.0 in | Wt 176.2 lb

## 2022-10-30 DIAGNOSIS — R059 Cough, unspecified: Secondary | ICD-10-CM | POA: Diagnosis not present

## 2022-10-30 DIAGNOSIS — N63 Unspecified lump in unspecified breast: Secondary | ICD-10-CM

## 2022-10-30 DIAGNOSIS — R635 Abnormal weight gain: Secondary | ICD-10-CM

## 2022-10-30 DIAGNOSIS — F439 Reaction to severe stress, unspecified: Secondary | ICD-10-CM

## 2022-10-30 NOTE — Progress Notes (Unsigned)
Patient ID: Tara Davila, female   DOB: June 15, 1981, 41 y.o.   MRN: [id number]   Subjective:    Patient ID: Tara Davila, female    DOB: 11-Mar-1981, 41 y.o.   MRN: [id number]   Patient here for scheduled follow up.  Marland Kitchen   HPI Here to follow up regarding increased stress.  Also recently evaluated for breast reduction.  Has tentatively scheduled surgery for 11/2022.  Went to PT.  This did help some with her pain and discomfort.  She is still thinking about the surgery and if desires to proceed.  She has had some cough recently.  Started taking mucinex.  Using inhaler 2x/week.  Is walking.  No chest pain or sob with activity.  No increased sinus drainage.  No nausea or vomiting.  No diarrhea.  Overall feels managing well.  On wellbutrin.  Feels is helping.  Watching diet.  Weight down several more pounds.    Past Medical History:  Diagnosis Date   Allergy    History of chicken pox    Hx: UTI (urinary tract infection)    Past Surgical History:  Procedure Laterality Date   BREAST BIOPSY Left 12/24/2021   left breast stereo x clip neg   WISDOM TOOTH EXTRACTION     Family History  Problem Relation Age of Onset   Hyperlipidemia Mother    Hypertension Father    Cancer Maternal Grandmother        breast   Breast cancer Maternal Grandmother 67   Hyperlipidemia Maternal Grandfather    Heart disease Maternal Grandfather    Hypertension Maternal Grandfather    Stroke Paternal Grandmother    Breast cancer Paternal Aunt    Social History   Socioeconomic History   Marital status: Married    Spouse name: Not on file   Number of children: Not on file   Years of education: Not on file   Highest education level: Not on file  Occupational History   Not on file  Tobacco Use   Smoking status: Never   Smokeless tobacco: Never  Substance and Sexual Activity   Alcohol use: Not Currently    Alcohol/week: 0.0 standard drinks of alcohol   Drug use: No   Sexual activity: Not on file   Other Topics Concern   Not on file  Social History Narrative   Lives at home   Social Determinants of Health   Financial Resource Strain: Not on file  Food Insecurity: Not on file  Transportation Needs: Not on file  Physical Activity: Not on file  Stress: Not on file  Social Connections: Not on file     Review of Systems  Constitutional:  Negative for appetite change and unexpected weight change.  HENT:  Positive for congestion and postnasal drip. Negative for sinus pressure.   Respiratory:  Positive for cough. Negative for chest tightness and shortness of breath.   Cardiovascular:  Negative for chest pain and palpitations.  Gastrointestinal:  Negative for abdominal pain, diarrhea, nausea and vomiting.  Genitourinary:  Negative for difficulty urinating and dysuria.  Musculoskeletal:  Negative for joint swelling and myalgias.  Skin:  Negative for color change and rash.  Neurological:  Negative for dizziness and headaches.  Psychiatric/Behavioral:  Negative for agitation and dysphoric mood.        Objective:     BP 110/76 (BP Location: Left Arm, Patient Position: Sitting, Cuff Size: Large)   Pulse 98   Temp 98.4 F (36.9 C) (Temporal)   Resp  15   Ht '5\' 5"'$  (1.651 m)   Wt 176 lb 3.2 oz (79.9 kg)   SpO2 98%   BMI 29.32 kg/m  Wt Readings from Last 3 Encounters:  10/30/22 176 lb 3.2 oz (79.9 kg)  09/11/22 180 lb (81.6 kg)  07/31/22 183 lb 3.2 oz (83.1 kg)    Physical Exam Vitals reviewed.  Constitutional:      General: She is not in acute distress.    Appearance: Normal appearance.  HENT:     Head: Normocephalic and atraumatic.     Right Ear: External ear normal.     Left Ear: External ear normal.  Eyes:     General: No scleral icterus.       Right eye: No discharge.        Left eye: No discharge.     Conjunctiva/sclera: Conjunctivae normal.  Neck:     Thyroid: No thyromegaly.  Cardiovascular:     Rate and Rhythm: Normal rate and regular rhythm.   Pulmonary:     Effort: No respiratory distress.     Breath sounds: Normal breath sounds. No wheezing.  Abdominal:     General: Bowel sounds are normal.     Palpations: Abdomen is soft.     Tenderness: There is no abdominal tenderness.  Musculoskeletal:        General: No swelling or tenderness.     Cervical back: Neck supple. No tenderness.  Lymphadenopathy:     Cervical: No cervical adenopathy.  Skin:    Findings: No erythema or rash.  Neurological:     Mental Status: She is alert.  Psychiatric:        Mood and Affect: Mood normal.        Behavior: Behavior normal.      Outpatient Encounter Medications as of 10/30/2022  Medication Sig   albuterol (VENTOLIN HFA) 108 (90 Base) MCG/ACT inhaler Inhale 2 puffs into the lungs as needed.   buPROPion (WELLBUTRIN XL) 150 MG 24 hr tablet TAKE 1 TABLET BY MOUTH EVERY DAY   cetirizine (ZYRTEC) 10 MG tablet Take 1 tablet by mouth in the morning and at bedtime.   mometasone (ELOCON) 0.1 % cream Apply 1 application topically as needed.   montelukast (SINGULAIR) 10 MG tablet Take 1 tablet by mouth daily.   No facility-administered encounter medications on file as of 10/30/2022.     Lab Results  Component Value Date   WBC 9.5 08/05/2021   HGB 12.5 08/05/2021   HCT 38.2 08/05/2021   PLT 225.0 08/05/2021   GLUCOSE 84 04/23/2022   CHOL 153 04/23/2022   TRIG 92.0 04/23/2022   HDL 52.70 04/23/2022   LDLCALC 82 04/23/2022   ALT 16 04/23/2022   AST 14 04/23/2022   NA 139 04/23/2022   K 3.7 04/23/2022   CL 104 04/23/2022   CREATININE 0.76 04/23/2022   BUN 12 04/23/2022   CO2 26 04/23/2022   TSH 3.00 04/23/2022    MM DIAG BREAST TOMO UNI LEFT  Result Date: 06/24/2022 CLINICAL DATA:  Status post stereotactic guided biopsy demonstrating fibroepithelial lesion. No sonographic correlate was identified. Short term follow-up was elected by patient. EXAM: DIGITAL DIAGNOSTIC UNILATERAL LEFT MAMMOGRAM WITH TOMOSYNTHESIS TECHNIQUE: Left digital  diagnostic mammography and breast tomosynthesis was performed. COMPARISON:  Previous exam(s). ACR Breast Density Category c: The breast tissue is heterogeneously dense, which may obscure small masses. FINDINGS: In the central LEFT breast at middle depth, there is an oval of circumscribed mass. It measures 7 x  7 by 6 mm. X clip is noted immediately adjacent to this mass. This previously measured 6 x 6 by 5 mm. Additional mammographically stable oval circumscribed mass is noted in the LEFT upper outer breast at 2 o'clock. No new suspicious findings are noted in the LEFT breast. 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 Indolent growth could be seen in either end of the benign fibroepithelial spectrum. Recommend continued close attention on follow-up with diagnostic mammogram (with ultrasound as deemed necessary) in 6 months versus surgical excision. This will establish 1 year of definitive stability. RECOMMENDATION: Bilateral diagnostic mammogram (with LEFT breast ultrasound as deemed necessary) in 6 months. I have discussed the findings and recommendations with the patient. If applicable, a reminder letter will be sent to the patient regarding the next appointment. BI-RADS CATEGORY  2: Benign. Electronically Signed   By: Valentino Saxon M.D.   On: 06/24/2022 11:37      Assessment & Plan:   Problem List Items Addressed This Visit     Breast nodule    Evaluated 06/30/22 - Dr Bary Castilla.   Recommended bilateral diagnostic mammogram in 6 months and f/u with Dr Peyton Najjar.       Cough    Some cough as outlined.  Using albuterol inhaler.  Mucinex/albuterol as directed.  Saline nasal spray/steroid nasal spray as directed.  Follow.  Call with update.       Stress - Primary    Overall appears to be handling things relatively well.  Continue wellbutrin. Follow.       Weight gain    Was previously concerned  regarding weight gain. She has been doing well with diet adjustment.  Walking.  Weight down several more pounds.         Einar Pheasant, MD

## 2022-11-01 ENCOUNTER — Encounter: Payer: Self-pay | Admitting: Internal Medicine

## 2022-11-01 DIAGNOSIS — R059 Cough, unspecified: Secondary | ICD-10-CM | POA: Insufficient documentation

## 2022-11-01 NOTE — Assessment & Plan Note (Signed)
Evaluated 06/30/22 - Dr Bary Castilla.   Recommended bilateral diagnostic mammogram in 6 months and f/u with Dr Peyton Najjar.

## 2022-11-01 NOTE — Assessment & Plan Note (Signed)
Some cough as outlined.  Using albuterol inhaler.  Mucinex/albuterol as directed.  Saline nasal spray/steroid nasal spray as directed.  Follow.  Call with update.

## 2022-11-01 NOTE — Assessment & Plan Note (Signed)
Overall appears to be handling things relatively well.  Continue wellbutrin. Follow.

## 2022-11-01 NOTE — Assessment & Plan Note (Signed)
Was previously concerned regarding weight gain. She has been doing well with diet adjustment.  Walking.  Weight down several more pounds.

## 2022-11-27 ENCOUNTER — Other Ambulatory Visit: Payer: Self-pay | Admitting: General Surgery

## 2022-11-27 DIAGNOSIS — Z853 Personal history of malignant neoplasm of breast: Secondary | ICD-10-CM

## 2022-12-04 ENCOUNTER — Encounter: Payer: Self-pay | Admitting: Surgical

## 2022-12-04 ENCOUNTER — Ambulatory Visit (INDEPENDENT_AMBULATORY_CARE_PROVIDER_SITE_OTHER): Payer: BC Managed Care – PPO | Admitting: Surgical

## 2022-12-04 VITALS — BP 126/84 | HR 89 | Ht 65.0 in | Wt 178.2 lb

## 2022-12-04 DIAGNOSIS — M546 Pain in thoracic spine: Secondary | ICD-10-CM

## 2022-12-04 DIAGNOSIS — G8929 Other chronic pain: Secondary | ICD-10-CM

## 2022-12-04 DIAGNOSIS — N62 Hypertrophy of breast: Secondary | ICD-10-CM

## 2022-12-04 DIAGNOSIS — N63 Unspecified lump in unspecified breast: Secondary | ICD-10-CM

## 2022-12-04 MED ORDER — OXYCODONE HCL 5 MG PO TABS: 5.0000 mg | ORAL_TABLET | Freq: Four times a day (QID) | ORAL | 0 refills | Status: AC | PRN

## 2022-12-04 MED ORDER — CEPHALEXIN 500 MG PO CAPS: 500.0000 mg | ORAL_CAPSULE | Freq: Four times a day (QID) | ORAL | 0 refills | Status: AC

## 2022-12-04 MED ORDER — ONDANSETRON HCL 4 MG PO TABS: 4.0000 mg | ORAL_TABLET | Freq: Three times a day (TID) | ORAL | 0 refills | Status: DC | PRN

## 2022-12-04 NOTE — Progress Notes (Signed)
Patient ID: Tara Davila, female    DOB: 11-19-1981, 43 y.o.   MRN: [id number]  Chief Complaint  Patient presents with   Pre-op Exam      ICD-10-CM   1. Breast nodule  N63.0     2. Symptomatic mammary hypertrophy  N62     3. Chronic bilateral thoracic back pain  M54.6    G89.29       History of Present Illness: Tara Davila is a 42 y.o.  female  with a history of macromastia.  She presents for preoperative evaluation for upcoming procedure, Bilateral Breast Reduction with liposuction, scheduled for 12/17/22 with Dr.  Marla Roe  No personal hx of anesthesia. No know family complications related to anesthesia. No history of DVT/PE.  No family history of DVT/PE.  No family or personal history of bleeding or clotting disorders.  Patient is not currently taking any blood thinners.  No history of CVA/MI.   She does have Mirena IUD.  Summary of Previous Visit: STN is 30 cm bilaterally, she is a G cup and would like to be C/D cup.  She has a family history of breast cancer.  No tobacco use.  She has a fibroid in the left breast which we may be able to remove at the time of surgery.  Estimated excess breast tissue to be removed at time of surgery: 575 to 600 g grams  Job: Freight forwarder for dramatic art, [place]  PMH Significant for: Fibroadenoma of left lower breast, asthma -well-controlled.  Patient reports she is scheduled for 76-monthmammograms due to a left fibroadenoma, she is currently scheduled for additional mammogram February 2024, shortly after surgery.  She may move this up to have it completed prior to surgery.  She reports overall she is feeling well, she does have some anxiety related to the procedure due to never having surgery before.   Past Medical History: Allergies: No Known Allergies  Current Medications:  Current Outpatient Medications:    albuterol (VENTOLIN HFA) 108 (90 Base) MCG/ACT inhaler, Inhale 2 puffs into the lungs as needed., Disp:  , Rfl:    buPROPion (WELLBUTRIN XL) 150 MG 24 hr tablet, TAKE 1 TABLET BY MOUTH EVERY DAY, Disp: 90 tablet, Rfl: 1   cetirizine (ZYRTEC) 10 MG tablet, Take 1 tablet by mouth in the morning and at bedtime., Disp: , Rfl:    mometasone (ELOCON) 0.1 % cream, Apply 1 application topically as needed., Disp: , Rfl:    montelukast (SINGULAIR) 10 MG tablet, Take 1 tablet by mouth daily., Disp: , Rfl:   Past Medical Problems: Past Medical History:  Diagnosis Date   Allergy    History of chicken pox    Hx: UTI (urinary tract infection)     Past Surgical History: Past Surgical History:  Procedure Laterality Date   BREAST BIOPSY Left 12/24/2021   left breast stereo x clip neg   WISDOM TOOTH EXTRACTION      Social History: Social History   Socioeconomic History   Marital status: Married    Spouse name: Not on file   Number of children: Not on file   Years of education: Not on file   Highest education level: Not on file  Occupational History   Not on file  Tobacco Use   Smoking status: Never   Smokeless tobacco: Never  Substance and Sexual Activity   Alcohol use: Not Currently    Alcohol/week: 0.0 standard drinks of alcohol   Drug use:  No   Sexual activity: Not on file  Other Topics Concern   Not on file  Social History Narrative   Lives at home   Social Determinants of Health   Financial Resource Strain: Not on file  Food Insecurity: Not on file  Transportation Needs: Not on file  Physical Activity: Not on file  Stress: Not on file  Social Connections: Not on file  Intimate Partner Violence: Not on file    Family History: Family History  Problem Relation Age of Onset   Hyperlipidemia Mother    Hypertension Father    Cancer Maternal Grandmother        breast   Breast cancer Maternal Grandmother 50   Hyperlipidemia Maternal Grandfather    Heart disease Maternal Grandfather    Hypertension Maternal Grandfather    Stroke Paternal Grandmother    Breast cancer  Paternal Aunt     Review of Systems: Review of Systems  Constitutional: Negative.   Respiratory: Negative.    Cardiovascular: Negative.   Gastrointestinal: Negative.   Musculoskeletal:  Positive for back pain.  Neurological: Negative.     Physical Exam: Vital Signs BP 126/84 (BP Location: Left Arm, Patient Position: Sitting, Cuff Size: Normal)   Pulse 89   Ht '5\' 5"'$  (1.651 m)   Wt 178 lb 3.2 oz (80.8 kg)   SpO2 99%   BMI 29.65 kg/m   Physical Exam  Constitutional:      General: Not in acute distress.    Appearance: Normal appearance. Not ill-appearing.  HENT:     Head: Normocephalic and atraumatic.  Eyes:     Pupils: Pupils are equal, round Neck:     Musculoskeletal: Normal range of motion.  Cardiovascular:     Rate and Rhythm: Normal rate    Pulses: Normal pulses.  Pulmonary:     Effort: Pulmonary effort is normal. No respiratory distress.  Musculoskeletal: Normal range of motion.  Skin:    General: Skin is warm and dry.     Findings: No erythema or rash.  Neurological:     General: No focal deficit present.     Mental Status: Alert and oriented to person, place, and time. Mental status is at baseline.     Motor: No weakness.  Psychiatric:        Mood and Affect: Mood normal.        Behavior: Behavior normal.    Assessment/Plan: The patient is scheduled for bilateral breast reduction with Dr. Marla Roe.  Risks, benefits, and alternatives of procedure discussed, questions answered and consent obtained.    Smoking Status: Non-smoker; Counseling Given?  N/A Last Mammogram: Patient had mammogram January 2023, subsequently had biopsy of the left breast February 2023.  She had oval circumscribed mass, most consistent with benign fibroadenoma.  She subsequently had additional diagnostic mammogram 06/24/2022, recommendations were for bilateral diagnostic mammogram and left breast ultrasound as deemed necessary in 6 months.  She may undergo additional mammogram prior to  surgery to ensure stabilization of the left breast fibroadenoma.  We discussed that if visualized intraoperatively we will remove it.  Caprini Score: 5, high; Risk Factors include: Age, varicose veins, BMI > 25, and length of planned surgery. Recommendation for mechanical prophylaxis. Encourage early ambulation.   Pictures obtained: '@consult'$   Post-op Rx sent to pharmacy: Oxycodone, Zofran, Keflex  Patient was provided with the breast reduction and General Surgical Risk consent document and Pain Medication Agreement prior to their appointment.  They had adequate time to read through the risk  consent documents and Pain Medication Agreement. We also discussed them in person together during this preop appointment. All of their questions were answered to their satisfaction.  Recommended calling if they have any further questions.  Risk consent form and Pain Medication Agreement to be scanned into patient's chart.  The risk that can be encountered with breast reduction were discussed and include the following but not limited to these:  Breast asymmetry, fluid accumulation, firmness of the breast, inability to breast feed, loss of nipple or areola, skin loss, decrease or no nipple sensation, fat necrosis of the breast tissue, bleeding, infection, healing delay.  There are risks of anesthesia, changes to skin sensation and injury to nerves or blood vessels.  The muscle can be temporarily or permanently injured.  You may have an allergic reaction to tape, suture, glue, blood products which can result in skin discoloration, swelling, pain, skin lesions, poor healing.  Any of these can lead to the need for revisonal surgery or stage procedures.  A reduction has potential to interfere with diagnostic procedures.  Nipple or breast piercing can increase risks of infection.  This procedure is best done when the breast is fully developed.  Changes in the breast will continue to occur over time.  Pregnancy can alter the  outcomes of previous breast reduction surgery, weight gain and weigh loss can also effect the long term appearance.   The risks that can be encountered with and after liposuction were discussed and include the following but no limited to these:  Asymmetry, fluid accumulation, firmness of the area, fat necrosis with death of fat tissue, bleeding, infection, delayed healing, anesthesia risks, skin sensation changes, injury to structures including nerves, blood vessels, and muscles which may be temporary or permanent, allergies to tape, suture materials and glues, blood products, topical preparations or injected agents, skin and contour irregularities, skin discoloration and swelling, deep vein thrombosis, cardiac and pulmonary complications, pain, which may persist, persistent pain, recurrence of the lesion, poor healing of the incision, possible need for revisional surgery or staged procedures. Thiere can also be persistent swelling, poor wound healing, rippling or loose skin, worsening of cellulite, swelling, and thermal burn or heat injury from ultrasound with the ultrasound-assisted lipoplasty technique. Any change in weight fluctuations can alter the outcome.   Electronically signed by: Carola Rhine Mame Twombly, PA-C 12/04/2022 9:30 AM

## 2022-12-09 ENCOUNTER — Telehealth (INDEPENDENT_AMBULATORY_CARE_PROVIDER_SITE_OTHER): Payer: BC Managed Care – PPO | Admitting: Surgical

## 2022-12-09 DIAGNOSIS — N62 Hypertrophy of breast: Secondary | ICD-10-CM

## 2022-12-09 NOTE — Telephone Encounter (Signed)
Tara Davila is a 42 year old female scheduled for breast reduction with Dr. Marla Roe on 12/17/2022.   She has had multiple images of her bilateral breast, ultrasounds and biopsies.  November 28, 2021 she had a mammogram which showed a new mass in the left breast centrally to slightly inferiorly, however it was not seen sonographically.  She had a stable mass in the left breast at the 2 o'clock position 4 cm from the nipple which was 12 x 6 x 10 mm, previously 14 x 7 x 11 mm.  She also has a right breast mass at the 4 o'clock position 5 cm from the nipple that was 6 x 4 x 6 mm on November 28, 2021, unchanged.  Tara Davila subsequently had a mammogram clip placed in the left breast December 24, 2021 and a left breast biopsy, recommendations for surgical consultation for consideration of excision versus monitoring as the mass was most consistent with a benign fibroadenoma.  Tara Davila subsequently had imaging on 06/24/2022, left breast oval mass, circumscribed, 7 x 7 x 6 mm.  X clip noted adjacent to the mass.  Mass was previously 6 x 6 x 5 mm.  Recommendation for diagnostic mammogram with left breast ultrasound as necessary in 6 months.   Tara Davila is now scheduled for bilateral breast reduction with Dr. Marla Roe on 12/17/2022.  Her general surgery care has changed due to previous provider retiring.  She is scheduled to continue follow-up care with Dr. Windell Moment, MD and is scheduled for a mammogram for reevaluation 12/14/2022.  I have reached out to the Tara Davila to inform her that I would like her to discuss the mammogram clip and left breast mass with general surgery prior to her breast reduction.  Tara Davila was understanding of this.  I discussed with her I will plan to reach out to Dr. Windell Moment for his recommendations and we will plan to reevaluate after discussing.  We may need to postpone surgery depending on the recommendation for excision versus continued monitoring.  Discussed all of this with Tara Davila today  12/09/2022.

## 2022-12-10 ENCOUNTER — Telehealth: Payer: Self-pay | Admitting: Surgical

## 2022-12-10 NOTE — Telephone Encounter (Signed)
Spoke with patient today about her upcoming surgery on 12/17/2022 with Dr. Marla Roe.  We spoke with general surgery, recommendation is for excision of the fibroadenoma, we will plan to coordinate with Dr. Darrol Poke team.  Patient is aware and agreeable to the plan.  She is going to speak with Dr. Windell Moment soon as well to further discuss.  Our surgical scheduling team will plan to reach out to her to discuss timelines.  She noted that her authorization was only good until March

## 2022-12-11 ENCOUNTER — Ambulatory Visit: Payer: Self-pay | Admitting: General Surgery

## 2022-12-11 ENCOUNTER — Other Ambulatory Visit: Payer: Self-pay | Admitting: General Surgery

## 2022-12-11 DIAGNOSIS — D242 Benign neoplasm of left breast: Secondary | ICD-10-CM

## 2022-12-11 NOTE — H&P (Signed)
HISTORY OF PRESENT ILLNESS:    Tara Davila is a 42 y.o.female patient who comes for reevaluation of left breast for fibroadenoma.  Patient was initially found with a left breast mass in December 2020.  It was found to be probably benign and she has been followed with with serial mammograms.  On February 2023 she had a biopsy that showed fibroepithelial lesion, most likely fibroadenoma.  The last diagnostic mammogram of the left breast shows a very small increase in size of the known fibroepithelial lesion.  Impression evaluated the images.  Patient denies breast pain.  She denies nipple discharge.  She denies any skin changes.  She denies nipple retraction.  Patient has been having back and shoulder pain for a long time.  She has been evaluated by Dr. Marla Roe, plastic surgeon and she was found with gynecomastia as a cause of her pain.  She is planned to have breast reduction surgery.      PAST MEDICAL HISTORY:  Past Medical History:  Diagnosis Date   Chicken pox    Seasonal allergies    UTI (urinary tract infection)         PAST SURGICAL HISTORY:   Past Surgical History:  Procedure Laterality Date   PERCUTANEOUS BIOPSY BREAST Left 12/24/2021   Fibroepithelial lesion, cellular myxoid stroma.  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   wisdom teeth extraction           MEDICATIONS:  Outpatient Encounter Medications as of 12/11/2022  Medication Sig Dispense Refill   albuterol 90 mcg/actuation inhaler as needed     buPROPion (WELLBUTRIN XL) 150 MG XL tablet Take 1 tablet by mouth once daily     cetirizine (ZYRTEC) 10 MG tablet Take 10 mg by mouth 2 (two) times daily     mometasone (ELOCON) 0.1 % cream Apply topically once daily Apply to affected area     montelukast (SINGULAIR) 10 mg tablet Take 10 mg by mouth once daily     sertraline (ZOLOFT) 50 MG tablet Take 1 tablet by mouth once daily (Patient not taking: Reported on 12/26/2020)     No facility-administered encounter  medications on file as of 12/11/2022.     ALLERGIES:   Patient has no known allergies.   SOCIAL HISTORY:  Social History   Socioeconomic History   Marital status: Married  Tobacco Use   Smoking status: Never   Smokeless tobacco: Never  Vaping Use   Vaping Use: Never used  Substance and Sexual Activity   Alcohol use: Not Currently    FAMILY HISTORY:  Family History  Problem Relation Age of Onset   High blood pressure (Hypertension) Mother    Hyperlipidemia (Elevated cholesterol) Mother    High blood pressure (Hypertension) Father    Breast cancer Maternal Grandmother    Hyperlipidemia (Elevated cholesterol) Maternal Grandfather    Heart disease Maternal Grandfather    High blood pressure (Hypertension) Maternal Grandfather    Stroke Paternal Grandmother    Breast cancer Paternal Aunt      GENERAL REVIEW OF SYSTEMS:   General ROS: negative for - chills, fatigue, fever, weight gain or weight loss Allergy and Immunology ROS: negative for - hives  Hematological and Lymphatic ROS: negative for - bleeding problems or bruising, negative for palpable nodes Endocrine ROS: negative for - heat or cold intolerance, hair changes Respiratory ROS: negative for - cough, shortness of breath or wheezing Cardiovascular ROS: no chest pain or palpitations GI ROS: negative for nausea, vomiting, abdominal pain, diarrhea, constipation  Musculoskeletal ROS: negative for - joint swelling or muscle pain Neurological ROS: negative for - confusion, syncope Dermatological ROS: negative for pruritus and rash  PHYSICAL EXAM:  Vitals:   12/11/22 0752  BP: 119/80  Pulse: (!) 114  .  Ht:165.1 cm ('5\' 5"'$ ) Wt:84.8 kg (187 lb) HMC:NOBS surface area is 1.97 meters squared. Body mass index is 31.12 kg/m.Marland Kitchen   GENERAL: Alert, active, oriented x3  HEENT: Pupils equal reactive to light. Extraocular movements are intact. Sclera clear. Palpebral conjunctiva normal red color.Pharynx clear.  NECK: Supple  with no palpable mass and no adenopathy.  LUNGS: Sound clear with no rales rhonchi or wheezes.  HEART: Regular rhythm S1 and S2 without murmur.  BREAST: Both breasts examined in the sitting and supine position.  There was no palpable masses, skin changes, nipple retraction or nipple discharge.  EXTREMITIES: Well-developed well-nourished symmetrical with no dependent edema.  NEUROLOGICAL: Awake alert oriented, facial expression symmetrical, moving all extremities.      IMPRESSION:     Fibroadenoma of left breast [D24.2]   Patient was found with known fibroadenoma.  She understand that this is a benign lesion that has been followed.  She also understand that there has been a very slight increase in size.  She was previously evaluated by Dr. Bary Castilla who discussed with patient that if she is can have breast reduction surgery he will be greatly had to remove the fibroadenoma since he might get more difficult to follow this lesion with imaging after rearrangement of breast tissue.  I discussed with patient the surgery of radiofrequency tag excisional biopsy.  This will be a combined surgery with plastic surgery during breast reduction surgery.  She understand the risks of surgery that includes bleeding, infection, breast pain, scar tissue, among others.  The patient reports she understood and agreed to proceed.  Patient has planning bilateral diagnostic mammogram 6 months from previous evaluation for close monitoring of the for adenoma.  I think that this should be done for preoperative planning.  If no change or worrisome of any malignancy we can proceed with the excisional biopsy in combination with breast reduction surgery.   PLAN:  Left breast radiofrequency tagged excisional biopsy (96283) Will do this in combination with Dr. Marla Roe during breast reduction surgery Avoid taking aspirin or any blood thinners for 5 days before surgery Contact us if you have any concern  Patient  verbalized understanding, all questions were answered, and were agreeable with the plan outlined above.   Herbert Pun, MD  Electronically signed by Herbert Pun, MD

## 2022-12-14 ENCOUNTER — Other Ambulatory Visit: Payer: BC Managed Care – PPO

## 2022-12-14 ENCOUNTER — Inpatient Hospital Stay: Admission: RE | Admit: 2022-12-14 | Payer: BC Managed Care – PPO | Source: Ambulatory Visit

## 2022-12-15 ENCOUNTER — Ambulatory Visit
Admission: RE | Admit: 2022-12-15 | Discharge: 2022-12-15 | Disposition: A | Discharge status: Home / Self Care | Payer: BC Managed Care – PPO | Source: Ambulatory Visit | Attending: General Surgery | Admitting: General Surgery

## 2022-12-15 DIAGNOSIS — D242 Benign neoplasm of left breast: Secondary | ICD-10-CM

## 2022-12-15 DIAGNOSIS — Z853 Personal history of malignant neoplasm of breast: Secondary | ICD-10-CM

## 2022-12-17 ENCOUNTER — Ambulatory Visit (HOSPITAL_BASED_OUTPATIENT_CLINIC_OR_DEPARTMENT_OTHER): Admit: 2022-12-17 | Payer: BC Managed Care – PPO | Admitting: Plastic Surgery

## 2022-12-17 ENCOUNTER — Encounter (HOSPITAL_BASED_OUTPATIENT_CLINIC_OR_DEPARTMENT_OTHER): Payer: Self-pay

## 2022-12-17 SURGERY — BREAST REDUCTION WITH LIPOSUCTION
Anesthesia: General | Site: Breast | Laterality: Bilateral

## 2022-12-17 SURGERY — Surgical Case
Anesthesia: *Unknown

## 2022-12-28 ENCOUNTER — Encounter: Payer: BC Managed Care – PPO | Admitting: Student

## 2023-01-04 ENCOUNTER — Other Ambulatory Visit: Payer: BC Managed Care – PPO

## 2023-01-08 ENCOUNTER — Encounter: Payer: BC Managed Care – PPO | Admitting: Plastic Surgery

## 2023-01-18 ENCOUNTER — Encounter: Payer: Self-pay | Admitting: *Deleted

## 2023-01-18 ENCOUNTER — Encounter
Admission: RE | Admit: 2023-01-18 | Discharge: 2023-01-18 | Disposition: A | Discharge status: Home / Self Care | Payer: BC Managed Care – PPO | Source: Ambulatory Visit | Attending: General Surgery | Admitting: General Surgery

## 2023-01-18 VITALS — Ht 65.0 in | Wt 178.6 lb

## 2023-01-18 DIAGNOSIS — D649 Anemia, unspecified: Secondary | ICD-10-CM

## 2023-01-18 DIAGNOSIS — Z01812 Encounter for preprocedural laboratory examination: Secondary | ICD-10-CM

## 2023-01-18 DIAGNOSIS — Z01818 Encounter for other preprocedural examination: Secondary | ICD-10-CM

## 2023-01-18 HISTORY — DX: Anemia, unspecified: D64.9

## 2023-01-18 HISTORY — DX: Unspecified asthma, uncomplicated: J45.909

## 2023-01-18 HISTORY — DX: Gastro-esophageal reflux disease without esophagitis: K21.9

## 2023-01-18 NOTE — Patient Instructions (Signed)
Your procedure is scheduled on:01-27-23 Wednesday Report to the Registration Desk on the 1st floor of the [facility].Then proceed to the 2nd floor Surgery Desk To find out your arrival time, please call [phone] between 1PM - 3PM on:01-26-23 Tuesday If your arrival time is 6:00 am, do not arrive before that time as the [facility] entrance doors do not open until 6:00 am.  REMEMBER: Instructions that are not followed completely may result in serious medical risk, up to and including death; or upon the discretion of your surgeon and anesthesiologist your surgery may need to be rescheduled.  Do not eat food OR drink any liquids after midnight the night before surgery.  No gum chewing or hard candies.  One week prior to surgery: Stop Anti-inflammatories (NSAIDS) such as Advil, Aleve, Ibuprofen, Motrin, Naproxen, Naprosyn and Aspirin based products such as Excedrin, Goody's Powder, BC Powder.You may however, take Tylenol if needed for pain up until the day of surgery. Stop ANY OVER THE COUNTER supplements/vitamins NOW (01-18-23) until after surgery.  Do NOT take any medication the day of surgery  Use your Albuterol Inhaler the day of surgery and bring your Albuterol Inhaler to the hospital   No Alcohol for 24 hours before or after surgery.  No Smoking including e-cigarettes for 24 hours before surgery.  No chewable tobacco products for at least 6 hours before surgery.  No nicotine patches on the day of surgery.  Do not use any "recreational" drugs for at least a week (preferably 2 weeks) before your surgery.  Please be advised that the combination of cocaine and anesthesia may have negative outcomes, up to and including death. If you test positive for cocaine, your surgery will be cancelled.  On the morning of surgery brush your teeth with toothpaste and water, you may rinse your mouth with mouthwash if you wish. Do not swallow any toothpaste or mouthwash.  Use CHG Soap as directed on  instruction sheet.  Do not wear jewelry, make-up, hairpins, clips or nail polish.  Do not wear lotions, powders, or perfumes.   Do not shave body hair from the neck down 48 hours before surgery.  Contact lenses, hearing aids and dentures may not be worn into surgery.  Do not bring valuables to the hospital. [facility] is not responsible for any missing/lost belongings or valuables.    Notify your doctor if there is any change in your medical condition (cold, fever, infection).  Wear comfortable clothing (specific to your surgery type) to the hospital.  After surgery, you can help prevent lung complications by doing breathing exercises.  Take deep breaths and cough every 1-2 hours. Your doctor may order a device called an Incentive Spirometer to help you take deep breaths. When coughing or sneezing, hold a pillow firmly against your incision with both hands. This is called "splinting." Doing this helps protect your incision. It also decreases belly discomfort.  If you are being admitted to the hospital overnight, leave your suitcase in the car. After surgery it may be brought to your room.  In case of increased patient census, it may be necessary for you, the patient, to continue your postoperative care in the Same Day Surgery department.  If you are being discharged the day of surgery, you will not be allowed to drive home. You will need a responsible individual to drive you home and stay with you for 24 hours after surgery.   If you are taking public transportation, you will need to have a  responsible individual with you.  Please call the [facility] Dept. at [phone] if you have any questions about these instructions.  Surgery Visitation Policy:  Patients undergoing a surgery or procedure may have two family members or support persons with them as long as the person is not COVID-19 positive or experiencing its symptoms.   Due to an increase in RSV and influenza  rates and associated hospitalizations, children ages 68 and under will not be able to visit patients in [facility]. Masks continue to be strongly recommended.     Preparing for Surgery with CHLORHEXIDINE GLUCONATE (CHG) Soap  Chlorhexidine Gluconate (CHG) Soap  o An antiseptic cleaner that kills germs and bonds with the skin to continue killing germs even after washing  o Used for showering the night before surgery and morning of surgery  Before surgery, you can play an important role by reducing the number of germs on your skin.  CHG (Chlorhexidine gluconate) soap is an antiseptic cleanser which kills germs and bonds with the skin to continue killing germs even after washing.  Please do not use if you have an allergy to CHG or antibacterial soaps. If your skin becomes reddened/irritated stop using the CHG.  1. Shower the NIGHT BEFORE SURGERY and the MORNING OF SURGERY with CHG soap.  2. If you choose to wash your hair, wash your hair first as usual with your normal shampoo.  3. After shampooing, rinse your hair and body thoroughly to remove the shampoo.  4. Use CHG as you would any other liquid soap. You can apply CHG directly to the skin and wash gently with a scrungie or a clean washcloth.  5. Apply the CHG soap to your body only from the neck down. Do not use on open wounds or open sores. Avoid contact with your eyes, ears, mouth, and genitals (private parts). Wash face and genitals (private parts) with your normal soap.  6. Wash thoroughly, paying special attention to the area where your surgery will be performed.  7. Thoroughly rinse your body with warm water.  8. Do not shower/wash with your normal soap after using and rinsing off the CHG soap.  9. Pat yourself dry with a clean towel.  10. Wear clean pajamas to bed the night before surgery.  12. Place clean sheets on your bed the night of your first shower and do not sleep with pets.  13. Shower again with  the CHG soap on the day of surgery prior to arriving at the hospital.  14. Do not apply any deodorants/lotions/powders.  15. Please wear clean clothes to the hospital.

## 2023-01-19 ENCOUNTER — Ambulatory Visit: Payer: BC Managed Care – PPO | Admitting: Surgical

## 2023-01-19 ENCOUNTER — Telehealth: Payer: Self-pay | Admitting: Plastic Surgery

## 2023-01-19 DIAGNOSIS — N62 Hypertrophy of breast: Secondary | ICD-10-CM

## 2023-01-19 DIAGNOSIS — N63 Unspecified lump in unspecified breast: Secondary | ICD-10-CM

## 2023-01-19 DIAGNOSIS — G8929 Other chronic pain: Secondary | ICD-10-CM

## 2023-01-19 DIAGNOSIS — M546 Pain in thoracic spine: Secondary | ICD-10-CM

## 2023-01-19 NOTE — H&P (View-Only) (Signed)
Patient ID: Tara Davila, female    DOB: Apr 22, 1981, 42 y.o.   MRN: [id number]  Chief Complaint  Patient presents with   Pre-op Exam      ICD-10-CM   1. Breast nodule  N63.0     2. Symptomatic mammary hypertrophy  N62     3. Chronic bilateral thoracic back pain  M54.6    G89.29       History of Present Illness: Tara Davila is a 42 y.o.  female  with a history of macromastia as well as a left breast mass.  She presents for preoperative evaluation for upcoming procedure, bilateral breast reduction with Dr. Marla Roe in conjunction with a left breast biopsy with radiofrequency localization with Dr. Windell Moment, scheduled for 01/27/2023  Patient had a previous preop appointment on1/10/2023.  At that appointment it was noted that patient had a mammogram January 2023 with subsequent biopsy of the left breast, had a mass that needed routine follow-up imaging.  There is also discussions about removing this mass and therefore she was sent to general surgery for additional discussion related to this and planning for excision of the mass in conjunction with breast reduction surgery.  Patient reports today via telephone visit that she does not have any additional questions related to the risks associated with bilateral breast reduction, she has picked up her medications prior to surgery.  She reports that she understands the risks associated with the breast reduction procedure.  She reports she has seen Cintron-Diaz and discussed his portion of the surgery.  No personal history of anesthesia.  No known family complications related to anesthesia. No history of DVT/PE.  No family history of DVT/PE.  No family or personal history of bleeding or clotting disorders.  Patient is not currently taking any blood thinners.  No history of CVA/MI.   She does have Mirena IUD in place.  Summary of Previous Visit: STN is 30 cm bilaterally, she is a G cup and would like to be a C/D cup.  She has a  family history of breast cancer.  No tobacco use.  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  PMH Significant for: Fibroadenoma of left breast, asthma - well-controlled  Patient reports she is doing well, no recent changes in her health since her last appointment/preop on 12/04/2022.  Past Medical History: Allergies: No Known Allergies  Current Medications:  Current Outpatient Medications:    albuterol (VENTOLIN HFA) 108 (90 Base) MCG/ACT inhaler, Inhale 2 puffs into the lungs as needed., Disp: , Rfl:    buPROPion (WELLBUTRIN XL) 150 MG 24 hr tablet, TAKE 1 TABLET BY MOUTH EVERY DAY (Patient taking differently: 150 mg at bedtime.), Disp: 90 tablet, Rfl: 1   calcium carbonate (TUMS - DOSED IN MG ELEMENTAL CALCIUM) 500 MG chewable tablet, Chew 1 tablet by mouth as needed for indigestion or heartburn., Disp: , Rfl:    cetirizine (ZYRTEC) 10 MG tablet, Take 1 tablet by mouth at bedtime., Disp: , Rfl:    mometasone (ELOCON) 0.1 % cream, Apply 1 application topically as needed., Disp: , Rfl:    montelukast (SINGULAIR) 10 MG tablet, Take 1 tablet by mouth at bedtime., Disp: , Rfl:    ondansetron (ZOFRAN) 4 MG tablet, Take 1 tablet (4 mg total) by mouth every 8 (eight) hours as needed for nausea or vomiting. (Patient not taking: Reported on 01/18/2023), Disp: 20 tablet, Rfl: 0   sodium  chloride (OCEAN) 0.65 % SOLN nasal spray, Place 1 spray into both nostrils as needed for congestion., Disp: , Rfl:   Past Medical Problems: Past Medical History:  Diagnosis Date   Allergy    Anemia    Asthma    well controlled   GERD (gastroesophageal reflux disease)    occ   History of chicken pox    Hx: UTI (urinary tract infection)     Past Surgical History: Past Surgical History:  Procedure Laterality Date   BREAST BIOPSY Left 12/24/2021   left breast stereo x clip neg   WISDOM TOOTH EXTRACTION      Social History: Social History   Socioeconomic  History   Marital status: Married    Spouse name: Not on file   Number of children: Not on file   Years of education: Not on file   Highest education level: Not on file  Occupational History   Not on file  Tobacco Use   Smoking status: Never   Smokeless tobacco: Never  Vaping Use   Vaping Use: Never used  Substance and Sexual Activity   Alcohol use: Yes    Comment: rare   Drug use: No   Sexual activity: Not on file  Other Topics Concern   Not on file  Social History Narrative   Lives at home   Social Determinants of Health   Financial Resource Strain: Not on file  Food Insecurity: Not on file  Transportation Needs: Not on file  Physical Activity: Not on file  Stress: Not on file  Social Connections: Not on file  Intimate Partner Violence: Not on file    Family History: Family History  Problem Relation Age of Onset   Hyperlipidemia Mother    Hypertension Father    Cancer Maternal Grandmother        breast   Breast cancer Maternal Grandmother 50   Hyperlipidemia Maternal Grandfather    Heart disease Maternal Grandfather    Hypertension Maternal Grandfather    Stroke Paternal Grandmother    Breast cancer Paternal Aunt     Review of Systems: ROS  Physical Exam: Vital Signs LMP 12/18/2022 (Approximate)   Physical Exam  Constitutional:      General: Not in acute distress. Psychiatric:        Mood and Affect: Mood normal.        Behavior: Behavior normal.    Assessment/Plan: The patient is scheduled for bilateral breast reduction with Dr. Marla Roe in conjunction with left breast biopsy with Dr. Windell Moment.  Risks, benefits, and alternatives of procedure discussed, questions answered and consent obtained.    Smoking Status: Non-smoker; Counseling Given? N/a Last Mammogram: Diagnostic mammogram and subsequent radiofrequency tag location in the left breast on December 15, 2022; Results: Left central breast mass at middle depth, 9 x 8 x 7 mm, previously 6 x  6 x 5 mm, X clip is noted to be just inferior to the mass by 5 mm.  Caprini Score: 5, high; Risk Factors include: Age, varicose veins, BMI > 25, and length of planned surgery. Recommendation for mechanical prophylaxis. Encourage early ambulation.   Pictures obtained: '@consult'$   Post-op Rx sent to pharmacy:  Prescriptions previously sent to patient's pharmacy at her last preop  Patient was previously provided with the General Surgical Risk consent document and Pain Medication Agreement prior to their appointment.  They had adequate time to read through the risk consent documents and Pain Medication Agreement. We also discussed them in person together during  preop appointment. All of their questions were answered to their satisfaction.  Recommended calling if they have any further questions.    Patient reported no additional questions related to breast reduction risks or the breast reduction procedure.  The risk that can be encountered with breast reduction were discussed and include the following but not limited to these:  Breast asymmetry, fluid accumulation, firmness of the breast, inability to breast feed, loss of nipple or areola, skin loss, decrease or no nipple sensation, fat necrosis of the breast tissue, bleeding, infection, healing delay.  There are risks of anesthesia, changes to skin sensation and injury to nerves or blood vessels.  The muscle can be temporarily or permanently injured.  You may have an allergic reaction to tape, suture, glue, blood products which can result in skin discoloration, swelling, pain, skin lesions, poor healing.  Any of these can lead to the need for revisonal surgery or stage procedures.  A reduction has potential to interfere with diagnostic procedures.  Nipple or breast piercing can increase risks of infection.  This procedure is best done when the breast is fully developed.  Changes in the breast will continue to occur over time.  Pregnancy can alter the outcomes  of previous breast reduction surgery, weight gain and weigh loss can also effect the long term appearance.     Electronically signed by: Carola Rhine Darneshia Demary, PA-C 01/19/2023 9:57 AM

## 2023-01-19 NOTE — Telephone Encounter (Signed)
LVM for pt to call and schedule post op appts wit PA and Provider.

## 2023-01-19 NOTE — Progress Notes (Signed)
Patient ID: Tara Davila, female    DOB: Aug 28, 1981, 42 y.o.   MRN: [id number]  Chief Complaint  Patient presents with   Pre-op Exam      ICD-10-CM   1. Breast nodule  N63.0     2. Symptomatic mammary hypertrophy  N62     3. Chronic bilateral thoracic back pain  M54.6    G89.29       History of Present Illness: Tara Davila is a 42 y.o.  female  with a history of macromastia as well as a left breast mass.  She presents for preoperative evaluation for upcoming procedure, bilateral breast reduction with Dr. Marla Roe in conjunction with a left breast biopsy with radiofrequency localization with Dr. Windell Moment, scheduled for 01/27/2023  Patient had a previous preop appointment on1/10/2023.  At that appointment it was noted that patient had a mammogram January 2023 with subsequent biopsy of the left breast, had a mass that needed routine follow-up imaging.  There is also discussions about removing this mass and therefore she was sent to general surgery for additional discussion related to this and planning for excision of the mass in conjunction with breast reduction surgery.  Patient reports today via telephone visit that she does not have any additional questions related to the risks associated with bilateral breast reduction, she has picked up her medications prior to surgery.  She reports that she understands the risks associated with the breast reduction procedure.  She reports she has seen Cintron-Diaz and discussed his portion of the surgery.  No personal history of anesthesia.  No known family complications related to anesthesia. No history of DVT/PE.  No family history of DVT/PE.  No family or personal history of bleeding or clotting disorders.  Patient is not currently taking any blood thinners.  No history of CVA/MI.   She does have Mirena IUD in place.  Summary of Previous Visit: STN is 30 cm bilaterally, she is a G cup and would like to be a C/D cup.  She has a  family history of breast cancer.  No tobacco use.  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  PMH Significant for: Fibroadenoma of left breast, asthma - well-controlled  Patient reports she is doing well, no recent changes in her health since her last appointment/preop on 12/04/2022.  Past Medical History: Allergies: No Known Allergies  Current Medications:  Current Outpatient Medications:    albuterol (VENTOLIN HFA) 108 (90 Base) MCG/ACT inhaler, Inhale 2 puffs into the lungs as needed., Disp: , Rfl:    buPROPion (WELLBUTRIN XL) 150 MG 24 hr tablet, TAKE 1 TABLET BY MOUTH EVERY DAY (Patient taking differently: 150 mg at bedtime.), Disp: 90 tablet, Rfl: 1   calcium carbonate (TUMS - DOSED IN MG ELEMENTAL CALCIUM) 500 MG chewable tablet, Chew 1 tablet by mouth as needed for indigestion or heartburn., Disp: , Rfl:    cetirizine (ZYRTEC) 10 MG tablet, Take 1 tablet by mouth at bedtime., Disp: , Rfl:    mometasone (ELOCON) 0.1 % cream, Apply 1 application topically as needed., Disp: , Rfl:    montelukast (SINGULAIR) 10 MG tablet, Take 1 tablet by mouth at bedtime., Disp: , Rfl:    ondansetron (ZOFRAN) 4 MG tablet, Take 1 tablet (4 mg total) by mouth every 8 (eight) hours as needed for nausea or vomiting. (Patient not taking: Reported on 01/18/2023), Disp: 20 tablet, Rfl: 0   sodium  chloride (OCEAN) 0.65 % SOLN nasal spray, Place 1 spray into both nostrils as needed for congestion., Disp: , Rfl:   Past Medical Problems: Past Medical History:  Diagnosis Date   Allergy    Anemia    Asthma    well controlled   GERD (gastroesophageal reflux disease)    occ   History of chicken pox    Hx: UTI (urinary tract infection)     Past Surgical History: Past Surgical History:  Procedure Laterality Date   BREAST BIOPSY Left 12/24/2021   left breast stereo x clip neg   WISDOM TOOTH EXTRACTION      Social History: Social History   Socioeconomic  History   Marital status: Married    Spouse name: Not on file   Number of children: Not on file   Years of education: Not on file   Highest education level: Not on file  Occupational History   Not on file  Tobacco Use   Smoking status: Never   Smokeless tobacco: Never  Vaping Use   Vaping Use: Never used  Substance and Sexual Activity   Alcohol use: Yes    Comment: rare   Drug use: No   Sexual activity: Not on file  Other Topics Concern   Not on file  Social History Narrative   Lives at home   Social Determinants of Health   Financial Resource Strain: Not on file  Food Insecurity: Not on file  Transportation Needs: Not on file  Physical Activity: Not on file  Stress: Not on file  Social Connections: Not on file  Intimate Partner Violence: Not on file    Family History: Family History  Problem Relation Age of Onset   Hyperlipidemia Mother    Hypertension Father    Cancer Maternal Grandmother        breast   Breast cancer Maternal Grandmother 50   Hyperlipidemia Maternal Grandfather    Heart disease Maternal Grandfather    Hypertension Maternal Grandfather    Stroke Paternal Grandmother    Breast cancer Paternal Aunt     Review of Systems: ROS  Physical Exam: Vital Signs LMP 12/18/2022 (Approximate)   Physical Exam  Constitutional:      General: Not in acute distress. Psychiatric:        Mood and Affect: Mood normal.        Behavior: Behavior normal.    Assessment/Plan: The patient is scheduled for bilateral breast reduction with Dr. Marla Roe in conjunction with left breast biopsy with Dr. Windell Moment.  Risks, benefits, and alternatives of procedure discussed, questions answered and consent obtained.    Smoking Status: Non-smoker; Counseling Given? N/a Last Mammogram: Diagnostic mammogram and subsequent radiofrequency tag location in the left breast on December 15, 2022; Results: Left central breast mass at middle depth, 9 x 8 x 7 mm, previously 6 x  6 x 5 mm, X clip is noted to be just inferior to the mass by 5 mm.  Caprini Score: 5, high; Risk Factors include: Age, varicose veins, BMI > 25, and length of planned surgery. Recommendation for mechanical prophylaxis. Encourage early ambulation.   Pictures obtained: '@consult'$   Post-op Rx sent to pharmacy:  Prescriptions previously sent to patient's pharmacy at her last preop  Patient was previously provided with the General Surgical Risk consent document and Pain Medication Agreement prior to their appointment.  They had adequate time to read through the risk consent documents and Pain Medication Agreement. We also discussed them in person together during  preop appointment. All of their questions were answered to their satisfaction.  Recommended calling if they have any further questions.    Patient reported no additional questions related to breast reduction risks or the breast reduction procedure.  The risk that can be encountered with breast reduction were discussed and include the following but not limited to these:  Breast asymmetry, fluid accumulation, firmness of the breast, inability to breast feed, loss of nipple or areola, skin loss, decrease or no nipple sensation, fat necrosis of the breast tissue, bleeding, infection, healing delay.  There are risks of anesthesia, changes to skin sensation and injury to nerves or blood vessels.  The muscle can be temporarily or permanently injured.  You may have an allergic reaction to tape, suture, glue, blood products which can result in skin discoloration, swelling, pain, skin lesions, poor healing.  Any of these can lead to the need for revisonal surgery or stage procedures.  A reduction has potential to interfere with diagnostic procedures.  Nipple or breast piercing can increase risks of infection.  This procedure is best done when the breast is fully developed.  Changes in the breast will continue to occur over time.  Pregnancy can alter the outcomes  of previous breast reduction surgery, weight gain and weigh loss can also effect the long term appearance.     Electronically signed by: Carola Rhine Vergil Burby, PA-C 01/19/2023 9:57 AM

## 2023-01-21 ENCOUNTER — Encounter
Admission: RE | Admit: 2023-01-21 | Discharge: 2023-01-21 | Disposition: A | Discharge status: Home / Self Care | Payer: BC Managed Care – PPO | Source: Ambulatory Visit | Attending: General Surgery | Admitting: General Surgery

## 2023-01-21 DIAGNOSIS — D649 Anemia, unspecified: Secondary | ICD-10-CM | POA: Diagnosis not present

## 2023-01-21 DIAGNOSIS — Z01812 Encounter for preprocedural laboratory examination: Secondary | ICD-10-CM | POA: Insufficient documentation

## 2023-01-21 LAB — CBC
HCT: 39.9 % (ref 36.0–46.0)
Hemoglobin: 13.2 g/dL (ref 12.0–15.0)
MCH: 30.1 pg (ref 26.0–34.0)
MCHC: 33.1 g/dL (ref 30.0–36.0)
MCV: 90.9 fL (ref 80.0–100.0)
Platelets: 255 10*3/uL (ref 150–400)
RBC: 4.39 MIL/uL (ref 3.87–5.11)
RDW: 13.5 % (ref 11.5–15.5)
WBC: 11.2 10*3/uL — ABNORMAL HIGH (ref 4.0–10.5)
nRBC: 0 % (ref 0.0–0.2)

## 2023-01-27 ENCOUNTER — Other Ambulatory Visit: Payer: Self-pay

## 2023-01-27 ENCOUNTER — Encounter
Admission: RE | Disposition: A | Discharge status: Home / Self Care | Payer: Self-pay | Source: Home / Self Care | Attending: General Surgery

## 2023-01-27 ENCOUNTER — Ambulatory Visit
Admission: RE | Admit: 2023-01-27 | Discharge: 2023-01-27 | Disposition: A | Discharge status: Home / Self Care | Payer: BC Managed Care – PPO | Attending: General Surgery | Admitting: General Surgery

## 2023-01-27 ENCOUNTER — Encounter: Payer: Self-pay | Admitting: General Surgery

## 2023-01-27 ENCOUNTER — Ambulatory Visit: Payer: BC Managed Care – PPO

## 2023-01-27 ENCOUNTER — Ambulatory Visit: Payer: BC Managed Care – PPO | Admitting: Urgent Care

## 2023-01-27 ENCOUNTER — Ambulatory Visit: Payer: BC Managed Care – PPO | Admitting: Anesthesiology

## 2023-01-27 DIAGNOSIS — D242 Benign neoplasm of left breast: Secondary | ICD-10-CM | POA: Diagnosis present

## 2023-01-27 DIAGNOSIS — G4733 Obstructive sleep apnea (adult) (pediatric): Secondary | ICD-10-CM | POA: Insufficient documentation

## 2023-01-27 DIAGNOSIS — R921 Mammographic calcification found on diagnostic imaging of breast: Secondary | ICD-10-CM | POA: Insufficient documentation

## 2023-01-27 DIAGNOSIS — N62 Hypertrophy of breast: Secondary | ICD-10-CM | POA: Insufficient documentation

## 2023-01-27 DIAGNOSIS — Z01818 Encounter for other preprocedural examination: Secondary | ICD-10-CM

## 2023-01-27 DIAGNOSIS — J45909 Unspecified asthma, uncomplicated: Secondary | ICD-10-CM | POA: Diagnosis not present

## 2023-01-27 DIAGNOSIS — K219 Gastro-esophageal reflux disease without esophagitis: Secondary | ICD-10-CM | POA: Insufficient documentation

## 2023-01-27 HISTORY — PX: BREAST REDUCTION SURGERY: SHX8

## 2023-01-27 HISTORY — PX: BREAST BIOPSY WITH RADIO FREQUENCY LOCALIZER: SHX6895

## 2023-01-27 LAB — POCT PREGNANCY, URINE: Preg Test, Ur: NEGATIVE

## 2023-01-27 SURGERY — BREAST BIOPSY WITH RADIO FREQUENCY LOCALIZER
Anesthesia: General | Site: Breast | Laterality: Left

## 2023-01-27 MED ORDER — CHLORHEXIDINE GLUCONATE CLOTH 2 % EX PADS: 6.0000 | MEDICATED_PAD | Freq: Once | CUTANEOUS | Status: DC

## 2023-01-27 MED ORDER — BUPIVACAINE HCL (PF) 0.25 % IJ SOLN
INTRAMUSCULAR | Status: AC
  Filled 2023-01-27: qty 30

## 2023-01-27 MED ORDER — 0.9 % SODIUM CHLORIDE (POUR BTL) OPTIME
TOPICAL | Status: DC | PRN
  Administered 2023-01-27: 1000 mL

## 2023-01-27 MED ORDER — ONDANSETRON HCL 4 MG/2ML IJ SOLN
INTRAMUSCULAR | Status: DC | PRN
  Administered 2023-01-27: 4 mg via INTRAVENOUS

## 2023-01-27 MED ORDER — BUPIVACAINE LIPOSOME 1.3 % IJ SUSP
INTRAMUSCULAR | Status: AC
  Filled 2023-01-27: qty 10

## 2023-01-27 MED ORDER — PHENYLEPHRINE HCL (PRESSORS) 10 MG/ML IV SOLN
INTRAVENOUS | Status: DC | PRN
  Administered 2023-01-27: 80 ug via INTRAVENOUS
  Administered 2023-01-27 (×3): 160 ug via INTRAVENOUS
  Administered 2023-01-27: 80 ug via INTRAVENOUS

## 2023-01-27 MED ORDER — FENTANYL CITRATE (PF) 100 MCG/2ML IJ SOLN
INTRAMUSCULAR | Status: DC | PRN
  Administered 2023-01-27 (×2): 50 ug via INTRAVENOUS

## 2023-01-27 MED ORDER — CHLORHEXIDINE GLUCONATE 0.12 % MT SOLN: 15.0000 mL | Freq: Once | OROMUCOSAL | Status: AC

## 2023-01-27 MED ORDER — SODIUM CHLORIDE FLUSH 0.9 % IV SOLN
INTRAVENOUS | Status: AC
  Filled 2023-01-27: qty 10

## 2023-01-27 MED ORDER — PROPOFOL 10 MG/ML IV BOLUS
INTRAVENOUS | Status: DC | PRN
  Administered 2023-01-27: 150 mg via INTRAVENOUS

## 2023-01-27 MED ORDER — DEXAMETHASONE SODIUM PHOSPHATE 10 MG/ML IJ SOLN
INTRAMUSCULAR | Status: AC
  Filled 2023-01-27: qty 1

## 2023-01-27 MED ORDER — ACETAMINOPHEN 10 MG/ML IV SOLN
INTRAVENOUS | Status: AC
  Filled 2023-01-27: qty 100

## 2023-01-27 MED ORDER — CEFAZOLIN SODIUM-DEXTROSE 2-4 GM/100ML-% IV SOLN
INTRAVENOUS | Status: AC
  Filled 2023-01-27: qty 100

## 2023-01-27 MED ORDER — LIDOCAINE-EPINEPHRINE (PF) 1 %-1:200000 IJ SOLN
INTRAMUSCULAR | Status: AC
  Filled 2023-01-27: qty 30

## 2023-01-27 MED ORDER — MIDAZOLAM HCL 2 MG/2ML IJ SOLN
INTRAMUSCULAR | Status: AC
  Filled 2023-01-27: qty 2

## 2023-01-27 MED ORDER — CHLORHEXIDINE GLUCONATE 0.12 % MT SOLN
OROMUCOSAL | Status: AC
  Administered 2023-01-27: 15 mL via OROMUCOSAL
  Filled 2023-01-27: qty 15

## 2023-01-27 MED ORDER — ONDANSETRON HCL 4 MG/2ML IJ SOLN
INTRAMUSCULAR | Status: AC
  Filled 2023-01-27: qty 2

## 2023-01-27 MED ORDER — PROPOFOL 10 MG/ML IV BOLUS
INTRAVENOUS | Status: AC
  Filled 2023-01-27: qty 20

## 2023-01-27 MED ORDER — SEVOFLURANE IN SOLN
RESPIRATORY_TRACT | Status: AC
  Filled 2023-01-27: qty 250

## 2023-01-27 MED ORDER — CEFAZOLIN SODIUM-DEXTROSE 2-4 GM/100ML-% IV SOLN
2.0000 g | INTRAVENOUS | Status: AC
  Administered 2023-01-27: 2 g via INTRAVENOUS

## 2023-01-27 MED ORDER — DEXAMETHASONE SODIUM PHOSPHATE 10 MG/ML IJ SOLN
INTRAMUSCULAR | Status: DC | PRN
  Administered 2023-01-27: 5 mg via INTRAVENOUS

## 2023-01-27 MED ORDER — ESMOLOL HCL 100 MG/10ML IV SOLN
INTRAVENOUS | Status: DC | PRN
  Administered 2023-01-27: 20 mg via INTRAVENOUS
  Administered 2023-01-27 (×2): 10 mg via INTRAVENOUS

## 2023-01-27 MED ORDER — DEXMEDETOMIDINE HCL IN NACL 200 MCG/50ML IV SOLN
INTRAVENOUS | Status: DC | PRN
  Administered 2023-01-27: 8 ug via INTRAVENOUS
  Administered 2023-01-27: 4 ug via INTRAVENOUS
  Administered 2023-01-27: 8 ug via INTRAVENOUS

## 2023-01-27 MED ORDER — FAMOTIDINE 20 MG PO TABS: 20.0000 mg | ORAL_TABLET | Freq: Once | ORAL | Status: AC

## 2023-01-27 MED ORDER — ORAL CARE MOUTH RINSE: 15.0000 mL | Freq: Once | OROMUCOSAL | Status: AC

## 2023-01-27 MED ORDER — ACETAMINOPHEN 10 MG/ML IV SOLN: 1000.0000 mg | Freq: Once | INTRAVENOUS | Status: DC | PRN

## 2023-01-27 MED ORDER — BUPIVACAINE HCL (PF) 0.5 % IJ SOLN
INTRAMUSCULAR | Status: AC
  Filled 2023-01-27: qty 30

## 2023-01-27 MED ORDER — ROCURONIUM BROMIDE 10 MG/ML (PF) SYRINGE
PREFILLED_SYRINGE | INTRAVENOUS | Status: AC
  Filled 2023-01-27: qty 10

## 2023-01-27 MED ORDER — ESMOLOL HCL 100 MG/10ML IV SOLN
INTRAVENOUS | Status: AC
  Filled 2023-01-27: qty 10

## 2023-01-27 MED ORDER — OXYCODONE HCL 5 MG PO TABS
ORAL_TABLET | ORAL | Status: AC
  Filled 2023-01-27: qty 1

## 2023-01-27 MED ORDER — SODIUM CHLORIDE (PF) 0.9 % IJ SOLN
INTRAMUSCULAR | Status: DC | PRN
  Administered 2023-01-27: 20 mL

## 2023-01-27 MED ORDER — LIDOCAINE HCL (CARDIAC) PF 100 MG/5ML IV SOSY
PREFILLED_SYRINGE | INTRAVENOUS | Status: DC | PRN
  Administered 2023-01-27: 40 mg via INTRAVENOUS

## 2023-01-27 MED ORDER — SUGAMMADEX SODIUM 200 MG/2ML IV SOLN
INTRAVENOUS | Status: DC | PRN
  Administered 2023-01-27: 150 mg via INTRAVENOUS

## 2023-01-27 MED ORDER — EPINEPHRINE PF 1 MG/ML IJ SOLN
INTRAMUSCULAR | Status: AC
  Filled 2023-01-27: qty 1

## 2023-01-27 MED ORDER — FENTANYL CITRATE (PF) 100 MCG/2ML IJ SOLN
25.0000 ug | INTRAMUSCULAR | Status: DC | PRN
  Administered 2023-01-27 (×2): 25 ug via INTRAVENOUS

## 2023-01-27 MED ORDER — OXYCODONE HCL 5 MG PO TABS
5.0000 mg | ORAL_TABLET | Freq: Once | ORAL | Status: AC | PRN
  Administered 2023-01-27: 5 mg via ORAL

## 2023-01-27 MED ORDER — ONDANSETRON HCL 4 MG/2ML IJ SOLN: 4.0000 mg | Freq: Once | INTRAMUSCULAR | Status: DC | PRN

## 2023-01-27 MED ORDER — CEFAZOLIN SODIUM-DEXTROSE 2-4 GM/100ML-% IV SOLN: 2.0000 g | INTRAVENOUS | Status: DC

## 2023-01-27 MED ORDER — STERILE WATER FOR IRRIGATION IR SOLN
Status: DC | PRN
  Administered 2023-01-27: 1000 mL

## 2023-01-27 MED ORDER — LIDOCAINE-EPINEPHRINE (PF) 1 %-1:200000 IJ SOLN
INTRAMUSCULAR | Status: DC | PRN
  Administered 2023-01-27: 60 mL

## 2023-01-27 MED ORDER — LACTATED RINGERS IV SOLN: INTRAVENOUS | Status: DC | PRN

## 2023-01-27 MED ORDER — LIDOCAINE HCL (PF) 2 % IJ SOLN
INTRAMUSCULAR | Status: AC
  Filled 2023-01-27: qty 5

## 2023-01-27 MED ORDER — FENTANYL CITRATE (PF) 100 MCG/2ML IJ SOLN
INTRAMUSCULAR | Status: AC
  Filled 2023-01-27: qty 2

## 2023-01-27 MED ORDER — IPRATROPIUM-ALBUTEROL 0.5-2.5 (3) MG/3ML IN SOLN
RESPIRATORY_TRACT | Status: AC
  Administered 2023-01-27: 3 mL via RESPIRATORY_TRACT
  Filled 2023-01-27: qty 3

## 2023-01-27 MED ORDER — ACETAMINOPHEN 10 MG/ML IV SOLN
INTRAVENOUS | Status: DC | PRN
  Administered 2023-01-27: 1000 mg via INTRAVENOUS

## 2023-01-27 MED ORDER — PHENYLEPHRINE 80 MCG/ML (10ML) SYRINGE FOR IV PUSH (FOR BLOOD PRESSURE SUPPORT)
PREFILLED_SYRINGE | INTRAVENOUS | Status: AC
  Filled 2023-01-27: qty 10

## 2023-01-27 MED ORDER — FAMOTIDINE 20 MG PO TABS
ORAL_TABLET | ORAL | Status: AC
  Administered 2023-01-27: 20 mg via ORAL
  Filled 2023-01-27: qty 1

## 2023-01-27 MED ORDER — OXYCODONE HCL 5 MG/5ML PO SOLN: 5.0000 mg | Freq: Once | ORAL | Status: AC | PRN

## 2023-01-27 MED ORDER — MIDAZOLAM HCL 2 MG/2ML IJ SOLN
INTRAMUSCULAR | Status: DC | PRN
  Administered 2023-01-27: 2 mg via INTRAVENOUS

## 2023-01-27 MED ORDER — IPRATROPIUM-ALBUTEROL 0.5-2.5 (3) MG/3ML IN SOLN: 3.0000 mL | Freq: Once | RESPIRATORY_TRACT | Status: AC

## 2023-01-27 MED ORDER — LACTATED RINGERS IV SOLN: INTRAVENOUS | Status: DC

## 2023-01-27 MED ORDER — ROCURONIUM BROMIDE 100 MG/10ML IV SOLN
INTRAVENOUS | Status: DC | PRN
  Administered 2023-01-27: 50 mg via INTRAVENOUS
  Administered 2023-01-27: 20 mg via INTRAVENOUS
  Administered 2023-01-27: 10 mg via INTRAVENOUS

## 2023-01-27 MED ORDER — DEXMEDETOMIDINE HCL IN NACL 80 MCG/20ML IV SOLN
INTRAVENOUS | Status: AC
  Filled 2023-01-27: qty 20

## 2023-01-27 SURGICAL SUPPLY — 71 items
ADH SKN CLS APL DERMABOND .7 (GAUZE/BANDAGES/DRESSINGS) ×4
APL PRP STRL LF DISP 70% ISPRP (MISCELLANEOUS) ×2
BINDER BREAST LRG (GAUZE/BANDAGES/DRESSINGS) IMPLANT
BIOPATCH WHT 1IN DISK W/4.0 H (GAUZE/BANDAGES/DRESSINGS) IMPLANT
BLADE BOVIE TIP EXT 4 (BLADE) ×2 IMPLANT
BLADE SURG 15 STRL LF DISP TIS (BLADE) ×4 IMPLANT
BLADE SURG 15 STRL SS (BLADE) ×4
BLADE SURG SZ10 CARB STEEL (BLADE) ×2 IMPLANT
BULB RESERV EVAC DRAIN JP 100C (MISCELLANEOUS) IMPLANT
CHLORAPREP W/TINT 26 (MISCELLANEOUS) ×2 IMPLANT
CNTNR URN SCR LID CUP LEK RST (MISCELLANEOUS) ×2 IMPLANT
CONT SPEC 4OZ STRL OR WHT (MISCELLANEOUS)
DERMABOND ADVANCED .7 DNX12 (GAUZE/BANDAGES/DRESSINGS) ×6 IMPLANT
DEVICE DUBIN SPECIMEN MAMMOGRA (MISCELLANEOUS) ×2 IMPLANT
DRAIN CHANNEL 19F RND (DRAIN) IMPLANT
DRAPE CHEST BREAST 77X106 FENE (MISCELLANEOUS) ×2 IMPLANT
DRAPE LAPAROTOMY TRNSV 106X77 (MISCELLANEOUS) ×2 IMPLANT
ELECT CAUTERY BLADE TIP 2.5 (TIP) ×2
ELECT REM PT RETURN 9FT ADLT (ELECTROSURGICAL) ×4
ELECTRODE CAUTERY BLDE TIP 2.5 (TIP) ×2 IMPLANT
ELECTRODE REM PT RTRN 9FT ADLT (ELECTROSURGICAL) ×4 IMPLANT
GAUZE 4X4 16PLY ~~LOC~~+RFID DBL (SPONGE) ×4 IMPLANT
GAUZE SPONGE 4X4 12PLY STRL (GAUZE/BANDAGES/DRESSINGS) ×2 IMPLANT
GLOVE BIO SURGEON STRL SZ 6.5 (GLOVE) ×10 IMPLANT
GLOVE BIOGEL PI IND STRL 6.5 (GLOVE) ×2 IMPLANT
GOWN STRL REUS W/ TWL LRG LVL3 (GOWN DISPOSABLE) ×10 IMPLANT
GOWN STRL REUS W/TWL LRG LVL3 (GOWN DISPOSABLE) ×8
KIT MARKER MARGIN INK (KITS) IMPLANT
KIT TURNOVER KIT A (KITS) ×2 IMPLANT
LABEL OR SOLS (LABEL) ×2 IMPLANT
MANIFOLD NEPTUNE II (INSTRUMENTS) ×4 IMPLANT
MARKER MARGIN CORRECT CLIP (MARKER) IMPLANT
NDL FILTER BLUNT 18X1 1/2 (NEEDLE) ×2 IMPLANT
NDL HYPO 25X1 1.5 SAFETY (NEEDLE) ×2 IMPLANT
NEEDLE FILTER BLUNT 18X1 1/2 (NEEDLE) ×2 IMPLANT
NEEDLE HYPO 22GX1.5 SAFETY (NEEDLE) ×2 IMPLANT
NEEDLE HYPO 25X1 1.5 SAFETY (NEEDLE) ×2 IMPLANT
NS IRRIG 1000ML POUR BTL (IV SOLUTION) ×2 IMPLANT
PACK BASIN MAJOR ARMC (MISCELLANEOUS) ×2 IMPLANT
PACK BASIN MINOR ARMC (MISCELLANEOUS) ×2 IMPLANT
PAD ABD DERMACEA PRESS 5X9 (GAUZE/BANDAGES/DRESSINGS) ×4 IMPLANT
RETRACTOR RING XSMALL (MISCELLANEOUS) IMPLANT
RTRCTR WOUND ALEXIS 13CM XS SH (MISCELLANEOUS)
SET LOCALIZER 20 PROBE US (MISCELLANEOUS) ×2 IMPLANT
SOL PREP PVP 2OZ (MISCELLANEOUS) ×2
SOLUTION PREP PVP 2OZ (MISCELLANEOUS) ×2 IMPLANT
SPIKE FLUID TRANSFER (MISCELLANEOUS) ×2 IMPLANT
SPONGE T-LAP 18X18 ~~LOC~~+RFID (SPONGE) ×8 IMPLANT
STRIP CLOSURE SKIN 1/2X4 (GAUZE/BANDAGES/DRESSINGS) ×2 IMPLANT
SUT MNCRL 3-0 UNDYED SH (SUTURE) ×4 IMPLANT
SUT MNCRL 4-0 (SUTURE) ×10
SUT MNCRL 4-0 27XMFL (SUTURE) ×10
SUT MNCRL+ 5-0 UNDYED PC-3 (SUTURE) ×4 IMPLANT
SUT MON AB 3-0 SH 27 (SUTURE) ×4 IMPLANT
SUT MON AB 5-0 P3 18 (SUTURE) ×4 IMPLANT
SUT MONOCRYL 3-0 UNDYED (SUTURE) ×16
SUT MONOCRYL 5-0 (SUTURE) ×4
SUT PDS II 3-0 (SUTURE) IMPLANT
SUT PDS PLUS 2 (SUTURE)
SUT PDS PLUS AB 2-0 CT-1 (SUTURE) IMPLANT
SUT SILK 2 0 SH (SUTURE) ×2 IMPLANT
SUT VIC AB 3-0 SH 27 (SUTURE)
SUT VIC AB 3-0 SH 27X BRD (SUTURE) ×2 IMPLANT
SUTURE MNCRL 4-0 27XMF (SUTURE) ×6 IMPLANT
SYR 10ML LL (SYRINGE) ×6 IMPLANT
SYR BULB IRRIG 60ML STRL (SYRINGE) ×4 IMPLANT
TOWEL OR 17X26 4PK STRL BLUE (TOWEL DISPOSABLE) ×4 IMPLANT
TRAP FLUID SMOKE EVACUATOR (MISCELLANEOUS) ×4 IMPLANT
TRAP NEPTUNE SPECIMEN COLLECT (MISCELLANEOUS) ×2 IMPLANT
WATER STERILE IRR 1000ML POUR (IV SOLUTION) ×2 IMPLANT
WATER STERILE IRR 500ML POUR (IV SOLUTION) ×4 IMPLANT

## 2023-01-27 NOTE — H&P (Signed)
HISTORY OF PRESENT ILLNESS:  Tara Davila is a 42 y.o.female patient who comes for reevaluation of left breast for fibroadenoma.  Patient was initially found with a left breast mass in December 2020. It was found to be probably benign and she has been followed with with serial mammograms. On February 2023 she had a biopsy that showed fibroepithelial lesion, most likely fibroadenoma. The last diagnostic mammogram of the left breast shows a very small increase in size of the known fibroepithelial lesion. Impression evaluated the images.  Patient denies breast pain. She denies nipple discharge. She denies any skin changes. She denies nipple retraction.  Patient has been having back and shoulder pain for a long time. She has been evaluated by Dr. Marla Roe, plastic surgeon and she was found with gynecomastia as a cause of her pain. She is planned to have breast reduction surgery.   PAST MEDICAL HISTORY: Past Medical History: Diagnosis Date Chicken pox Seasonal allergies UTI (urinary tract infection)    PAST SURGICAL HISTORY: Past Surgical History: Procedure Laterality Date PERCUTANEOUS BIOPSY BREAST Left 12/24/2021 Fibroepithelial lesion, cellular myxoid stroma. 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 wisdom teeth extraction   MEDICATIONS: Outpatient Encounter Medications as of 12/11/2022 Medication Sig Dispense Refill albuterol 90 mcg/actuation inhaler as needed buPROPion (WELLBUTRIN XL) 150 MG XL tablet Take 1 tablet by mouth once daily cetirizine (ZYRTEC) 10 MG tablet Take 10 mg by mouth 2 (two) times daily mometasone (ELOCON) 0.1 % cream Apply topically once daily Apply to affected area montelukast (SINGULAIR) 10 mg tablet Take 10 mg by mouth once daily sertraline (ZOLOFT) 50 MG tablet Take 1 tablet by mouth once daily (Patient not taking: Reported on 12/26/2020)  No facility-administered encounter medications on file as of 12/11/2022.   ALLERGIES: Patient has no known  allergies.  SOCIAL HISTORY: Social History  Socioeconomic History Marital status: Married Tobacco Use Smoking status: Never Smokeless tobacco: Never Vaping Use Vaping Use: Never used Substance and Sexual Activity Alcohol use: Not Currently  FAMILY HISTORY: Family History Problem Relation Age of Onset High blood pressure (Hypertension) Mother Hyperlipidemia (Elevated cholesterol) Mother High blood pressure (Hypertension) Father Breast cancer Maternal Grandmother Hyperlipidemia (Elevated cholesterol) Maternal Grandfather Heart disease Maternal Grandfather High blood pressure (Hypertension) Maternal Grandfather Stroke Paternal Grandmother Breast cancer Paternal Aunt   GENERAL REVIEW OF SYSTEMS:  General ROS: negative for - chills, fatigue, fever, weight gain or weight loss Allergy and Immunology ROS: negative for - hives Hematological and Lymphatic ROS: negative for - bleeding problems or bruising, negative for palpable nodes Endocrine ROS: negative for - heat or cold intolerance, hair changes Respiratory ROS: negative for - cough, shortness of breath or wheezing Cardiovascular ROS: no chest pain or palpitations GI ROS: negative for nausea, vomiting, abdominal pain, diarrhea, constipation Musculoskeletal ROS: negative for - joint swelling or muscle pain Neurological ROS: negative for - confusion, syncope Dermatological ROS: negative for pruritus and rash  PHYSICAL EXAM: Vitals: 12/11/22 0752 BP: 119/80 Pulse: (!) 114 . Ht:165.1 cm ('5\' 5"'$ ) Wt:84.8 kg (187 lb) [id number] surface area is 1.97 meters squared. Body mass index is 31.12 kg/m.Marland Kitchen  GENERAL: Alert, active, oriented x3  HEENT: Pupils equal reactive to light. Extraocular movements are intact. Sclera clear. Palpebral conjunctiva normal red color.Pharynx clear.  NECK: Supple with no palpable mass and no adenopathy.  LUNGS: Sound clear with no rales rhonchi or wheezes.  HEART: Regular rhythm S1 and S2 without  murmur.  BREAST: Both breasts examined in the sitting and supine position. There was no palpable masses, skin  changes, nipple retraction or nipple discharge.  EXTREMITIES: Well-developed well-nourished symmetrical with no dependent edema.  NEUROLOGICAL: Awake alert oriented, facial expression symmetrical, moving all extremities.   IMPRESSION:  Fibroadenoma of left breast [D24.2]  Patient was found with known fibroadenoma. She understand that this is a benign lesion that has been followed. She also understand that there has been a very slight increase in size. She was previously evaluated by Dr. Bary Castilla who discussed with patient that if she is can have breast reduction surgery he will be greatly had to remove the fibroadenoma since he might get more difficult to follow this lesion with imaging after rearrangement of breast tissue.  I discussed with patient the surgery of radiofrequency tag excisional biopsy. This will be a combined surgery with plastic surgery during breast reduction surgery. She understand the risks of surgery that includes bleeding, infection, breast pain, scar tissue, among others. The patient reports she understood and agreed to proceed.  Last mammogram shows increasing fibroadenoma without malignant feature. Will proceed with excisional biopsy.   PLAN: Left breast radiofrequency tagged excisional biopsy [id number]) Will do this in combination with Dr. Marla Roe during breast reduction surgery Avoid taking aspirin or any blood thinners for 5 days before surgery Contact us if you have any concern  Patient verbalized understanding, all questions were answered, and were agreeable with the plan outlined above.  Herbert Pun, MD

## 2023-01-27 NOTE — Interval H&P Note (Signed)
History and Physical Interval Note:  01/27/2023 7:32 AM  Tara Davila  has presented today for surgery, with the diagnosis of D24.2 Fibroadenoma of lt breast.  The various methods of treatment have been discussed with the patient and family. After consideration of risks, benefits and other options for treatment, the patient has consented to  Procedure(s): BREAST BIOPSY WITH RADIO FREQUENCY LOCALIZER (Left) MAMMARY REDUCTION  (BREAST) (Bilateral) as a surgical intervention.  The patient's history has been reviewed, patient examined, no change in status, stable for surgery.  I have reviewed the patient's chart and labs.  Questions were answered to the patient's satisfaction.     Loel Lofty Sheryn Aldaz

## 2023-01-27 NOTE — Op Note (Signed)
Breast Reduction Op note:    DATE OF PROCEDURE: 01/27/2023  LOCATION: [facility]  SURGEON: Quade Ramirez Sanger Dacari Beckstrand, DO  ASSISTANT: Roetta Sessions, PA  PREOPERATIVE DIAGNOSIS 1. Macromastia 2. Neck Pain 3. Back Pain  POSTOPERATIVE DIAGNOSIS 1. Macromastia 2. Neck Pain 3. Back Pain  PROCEDURES 1. Bilateral breast reduction.  Right reduction 575 gm, Left reduction 550 gm (440 gm + biopsy)  COMPLICATIONS: None.  DRAINS: none  INDICATIONS FOR PROCEDURE Tara Davila is a 42 y.o. year-old female born on 13-Oct-1981,with a history of symptomatic macromastia with concominant back pain, neck pain, shoulder grooving from her bra.   MRN: [id number]  CONSENT Informed consent was obtained directly from the patient. The risks, benefits and alternatives were fully discussed. Specific risks including but not limited to bleeding, infection, hematoma, seroma, scarring, pain, nipple necrosis, asymmetry, poor cosmetic results, and need for further surgery were discussed. The patient's questions were answered.  DESCRIPTION OF PROCEDURE  Patient was brought into the operating room and rested on the operating room table in the supine position.  SCDs were placed and appropriate padding was performed.  Antibiotics were given. The patient underwent general anesthesia and the chest was prepped and draped in a sterile fashion.  A timeout was performed and all information was confirmed to be correct by those in the room.  Right side: Preoperative markings were confirmed.  Incision lines were injected with local containing epinephrine.  After waiting for vasoconstriction, the marked lines were incised with a #15 blade.  A Wise-pattern superomedial breast reduction was performed by de-epithelializing the pedicle, using bovie to create the superomedial pedicle, and removing breast tissue from the lateral and inferior portions of the breast.  Care was taken to not undermine the breast  pedicle. Experel was injected. Hemostasis was achieved.  The nipple was gently rotated into position and the soft tissue closed with 4-0 Monocryl.   The pocket was irrigated and hemostasis confirmed.  The deep tissues were approximated with 3-0 PDS sutures.  The skin was closed with deep dermal 3-0 Monocryl and subcuticular 4-0 Monocryl sutures.  The nipple and skin flaps had good capillary refill at the end of the procedure.    Left side: Once General surgery was finished with their portion of the case, the patient was rendered to the plastic surgery team.  The biopsy was taken from the 12 o'clock position of the areola and deep.  Preoperative markings were confirmed.  Incision lines were injected with local containing epinephrine.  After waiting for vasoconstriction, the marked lines were incised with a #15 blade.  A Wise-pattern superomedial breast reduction was performed by de-epithelializing the pedicle, using bovie to create the superomedial pedicle, and removing breast tissue from the lateral and inferior portions of the breast.  Care was taken to not undermine the breast pedicle. Hemostasis was achieved.  Experel was injected. The nipple was gently rotated into position and the soft tissue was closed with 4-0 Monocryl.  The patient was sat upright and size and shape symmetry was confirmed.  The pocket was irrigated and hemostasis confirmed.  The deep tissues were approximated with 3-0 PDS sutures. The skin was closed with deep dermal 3-0 Monocryl and subcuticular 4-0 Monocryl sutures.  Dermabond was applied.  A breast binder and ABDs were placed.  The nipple and skin flaps had good capillary refill at the end of the procedure.  The patient tolerated the procedure well. The patient was allowed to wake from anesthesia and taken to the recovery room  in satisfactory condition.  The advanced practice practitioner (APP) assisted throughout the case.  The APP was essential in retraction and counter traction  when needed to make the case progress smoothly.  This retraction and assistance made it possible to see the tissue plans for the procedure.  The assistance was needed for blood control, tissue re-approximation and assisted with closure of the incision site.

## 2023-01-27 NOTE — Anesthesia Postprocedure Evaluation (Signed)
Anesthesia Post Note  Patient: Tara Davila  Procedure(s) Performed: BREAST BIOPSY WITH RADIO FREQUENCY LOCALIZER (Left: Breast) MAMMARY REDUCTION  (BREAST) (Bilateral: Breast)  Patient location during evaluation: PACU Anesthesia Type: General Level of consciousness: awake and alert Pain management: pain level controlled Vital Signs Assessment: post-procedure vital signs reviewed and stable Respiratory status: spontaneous breathing, nonlabored ventilation, respiratory function stable and patient connected to nasal cannula oxygen Cardiovascular status: blood pressure returned to baseline and stable Postop Assessment: no apparent nausea or vomiting Anesthetic complications: no   No notable events documented.   Last Vitals:  Vitals:   01/27/23 1122 01/27/23 1133  BP: (!) 144/83 (!) 147/90  Pulse: 98 (!) 102  Resp: 13 14  Temp: (!) 36.1 C 36.4 C  SpO2: 97% 99%    Last Pain:  Vitals:   01/27/23 1133  TempSrc: Temporal  PainSc: 0-No pain                 Arita Miss

## 2023-01-27 NOTE — Transfer of Care (Signed)
Immediate Anesthesia Transfer of Care Note  Patient: ALAIYNA MOESSNER  Procedure(s) Performed: BREAST BIOPSY WITH RADIO FREQUENCY LOCALIZER (Left: Breast) MAMMARY REDUCTION  (BREAST) (Bilateral: Breast)  Patient Location: PACU  Anesthesia Type:General  Level of Consciousness: awake, oriented, and patient cooperative  Airway & Oxygen Therapy: Patient Spontanous Breathing and Patient connected to nasal cannula oxygen  Post-op Assessment: Report given to RN, Post -op Vital signs reviewed and stable, and Patient moving all extremities X 4  Post vital signs: Reviewed and stable  Last Vitals:  Vitals Value Taken Time  BP 137/73 01/27/23 1045  Temp    Pulse 103 01/27/23 1047  Resp 15 01/27/23 1047  SpO2 100 % 01/27/23 1047  Vitals shown include unvalidated device data.  Last Pain:  Vitals:   01/27/23 0738  TempSrc: Temporal  PainSc: 0-No pain         Complications: No notable events documented.

## 2023-01-27 NOTE — Op Note (Signed)
Preoperative diagnosis: Left breast fibroadenoma.  Postoperative diagnosis: Same.   Procedure: Left radiofrequency tag-localized excisional biopsy.                      Anesthesia: GETA  Surgeon: Dr. Windell Moment  Wound Classification: Clean  Indications: Patient is a 42 y.o. female with a nonpalpable left breast mass noted on mammography with core biopsy demonstrating fibroadenoma. This has been followed with imaging and has been increasing in size.  Requires radiofrequency tag-localized excisional biopsy to rule out malignancy.   Findings: 1. Specimen mammography shows marker and tag on specimen 2. No other palpable mass or lymph node identified.   Description of procedure: Preoperative radiofrequency tag localization was performed by radiology. The patient was taken to the operating room and placed supine on the operating table, and after general anesthesia the Bilateral chest was prepped and draped in the usual sterile fashion. A time-out was completed verifying correct patient, procedure, site, positioning, and implant(s) and/or special equipment prior to beginning this procedure.  This case was combined with Plastic surgery for breast reduction surgery. By comparing the localization studies and interrogation with Localizer device, the probable trajectory and location of the mass was visualized. A circumareolar skin incision was planned in such a way as to minimize the amount of dissection to reach the mass.  The skin incision was made on the inferior border of the areola. Flaps were raised and the location of the tag was confirmed with Localizer device confirmed. Dissection was then taken down circumferentially, taking care to include the entire localizing tag and a wide margin of grossly normal tissue. The specimen and entire localizing tag were removed. The specimen was oriented and sent to radiology with the localization studies.   The wound was left open for breast reconstruction  surgery by Dr. Marla Roe.   Specimen: Left breast excisional biopsy                      Complications: None  Estimated Blood Loss: 5 mL

## 2023-01-27 NOTE — Anesthesia Preprocedure Evaluation (Signed)
Anesthesia Evaluation  Patient identified by MRN, date of birth, ID band Patient awake  General Assessment Comment:  First general anesthetic  Reviewed: Allergy & Precautions, NPO status , Patient's Chart, lab work & pertinent test results  History of Anesthesia Complications Negative for: history of anesthetic complications  Airway Mallampati: II  TM Distance: >3 FB Neck ROM: Full    Dental no notable dental hx. (+) Teeth Intact   Pulmonary asthma , neg sleep apnea, neg COPD, Patient abstained from smoking.Not current smoker Patient says she's been feeling "tight" all week. Took inhaler this morning. Lung sounds CTAB on exam. Signs and symptoms of OSA   Pulmonary exam normal breath sounds clear to auscultation       Cardiovascular Exercise Tolerance: Good METS(-) hypertension(-) CAD and (-) Past MI negative cardio ROS (-) dysrhythmias  Rhythm:Regular Rate:Normal - Systolic murmurs    Neuro/Psych negative neurological ROS  negative psych ROS   GI/Hepatic ,GERD  Controlled,,(+)     (-) substance abuse    Endo/Other  neg diabetes    Renal/GU negative Renal ROS     Musculoskeletal   Abdominal   Peds  Hematology   Anesthesia Other Findings Past Medical History: No date: Allergy No date: Anemia No date: Asthma     Comment:  well controlled No date: GERD (gastroesophageal reflux disease)     Comment:  occ No date: History of chicken pox No date: Hx: UTI (urinary tract infection)  Reproductive/Obstetrics                              Anesthesia Physical Anesthesia Plan  ASA: 2  Anesthesia Plan: General   Post-op Pain Management: Ofirmev IV (intra-op)*   Induction: Intravenous  PONV Risk Score and Plan: 3 and Ondansetron, Dexamethasone and Midazolam  Airway Management Planned: Oral ETT and Video Laryngoscope Planned  Additional Equipment: None  Intra-op Plan:    Post-operative Plan: Extubation in OR  Informed Consent: I have reviewed the patients History and Physical, chart, labs and discussed the procedure including the risks, benefits and alternatives for the proposed anesthesia with the patient or authorized representative who has indicated his/her understanding and acceptance.     Dental advisory given  Plan Discussed with: CRNA and Surgeon  Anesthesia Plan Comments: (Discussed risks of anesthesia with patient, including PONV, sore throat, lip/dental/eye damage. Rare risks discussed as well, such as cardiorespiratory and neurological sequelae, and allergic reactions. Discussed the role of CRNA in patient's perioperative care. Patient understands.)         Anesthesia Quick Evaluation

## 2023-01-27 NOTE — Anesthesia Procedure Notes (Addendum)
Procedure Name: Intubation Date/Time: 01/27/2023 8:37 AM  Performed by: Hilbert Odor, CRNAPre-anesthesia Checklist: Patient identified, Patient being monitored, Timeout performed, Emergency Drugs available and Suction available Patient Re-evaluated:Patient Re-evaluated prior to induction Oxygen Delivery Method: Circle system utilized Preoxygenation: Pre-oxygenation with 100% oxygen Induction Type: IV induction Ventilation: Mask ventilation without difficulty Laryngoscope Size: 3 and McGraph Grade View: Grade I Tube type: Oral Tube size: 6.5 (per MDA request) mm Number of attempts: 1 Airway Equipment and Method: Stylet Placement Confirmation: ETT inserted through vocal cords under direct vision, positive ETCO2 and breath sounds checked- equal and bilateral Secured at: 20 cm Tube secured with: Tape Dental Injury: Teeth and Oropharynx as per pre-operative assessment

## 2023-01-27 NOTE — Discharge Instructions (Addendum)
INSTRUCTIONS FOR AFTER BREAST SURGERY   You will likely have some questions about what to expect following your operation.  The following information will help you and your family understand what to expect when you are discharged from the hospital.  It is important to follow these guidelines to help ensure a smooth recovery and reduce complication.  Postoperative instructions include information on: diet, wound care, medications and physical activity.  AFTER SURGERY Expect to go home after the procedure.  In some cases, you may need to spend one night in the hospital for observation.  DIET Breast surgery does not require a specific diet.  However, the healthier you eat the better your body will heal. It is important to increasing your protein intake.  This means limiting the foods with sugar and carbohydrates.  Focus on vegetables and some meat.  If you have liposuction during your procedure be sure to drink water.  If your urine is bright yellow, then it is concentrated, and you need to drink more water.  As a general rule after surgery, you should have 8 ounces of water every hour while awake.  If you find you are persistently nauseated or unable to take in liquids let us know.  NO TOBACCO USE or EXPOSURE.  This will slow your healing process and lead to a wound.  WOUND CARE Leave the binder on for 3 days . Use fragrance free soap like Dial, Dove or [place].   After 3 days you can remove the binder to shower. Once dry apply binder or sports bra. If you have liposuction you will have a soft and spongy dressing (Lipofoam) that helps prevent creases in your skin.  Remove before you shower and then replace it.  It is also available on [employer]. If you have steri-strips / tape directly attached to your skin leave them in place. It is OK to get these wet.   No baths, pools or hot tubs for four weeks. We close your incision to leave the smallest and best-looking scar. No ointment or creams on your incisions  for four weeks.  No Neosporin (Too many skin reactions).  A few weeks after surgery you can use Mederma and start massaging the scar. We ask you to wear your binder or sports bra for the first 6 weeks around the clock, including while sleeping. This provides added comfort and helps reduce the fluid accumulation at the surgery site. NO Ice or heating pads to the operative site.  You have a very high risk of a BURN before you feel the temperature change.  ACTIVITY No heavy lifting until cleared by the doctor.  This usually means no more than a half-gallon of milk.  It is OK to walk and climb stairs. Moving your legs is very important to decrease your risk of a blood clot.  It will also help keep you from getting deconditioned.  Every 1 to 2 hours get up and walk for 5 minutes. This will help with a quicker recovery back to normal.  Let pain be your guide so you don't do too much.  This time is for you to recover.  You will be more comfortable if you sleep and rest with your head elevated either with a few pillows under you or in a recliner.  No stomach sleeping for a three months.  WORK Everyone returns to work at different times. As a rough guide, most people take at least 1 - 2 weeks off prior to returning to work. If  you need documentation for your job, give the forms to the front staff at the clinic.  DRIVING Arrange for someone to bring you home from the hospital after your surgery.  You may be able to drive a few days after surgery but not while taking any narcotics or valium.  BOWEL MOVEMENTS Constipation can occur after anesthesia and while taking pain medication.  It is important to stay ahead for your comfort.  We recommend taking Milk of Magnesia (2 tablespoons; twice a day) while taking the pain pills.  MEDICATIONS You may be prescribed should start after surgery At your preoperative visit for you history and physical you may have been given the following medications: An antibiotic: Start  this medication when you get home and take according to the instructions on the bottle. Zofran 4 mg:  This is to treat nausea and vomiting.  You can take this every 6 hours as needed and only if needed. Valium 2 mg for breast cancer patients: This is for muscle tightness if you have an implant or expander. This will help relax your muscle which also helps with pain control.  This can be taken every 12 hours as needed. Don't drive after taking this medication. Norco (hydrocodone/acetaminophen) 5/325 mg:  This is only to be used after you have taken the Motrin or the Tylenol. Every 8 hours as needed.   Over the counter Medication to take: Ibuprofen (Motrin) 600 mg:  Take this every 6 hours.  If you have additional pain then take 500 mg of the Tylenol every 8 hours.  Only take the Norco after you have tried these two. MiraLAX or Milk of Magnesia: Take this according to the bottle if you take the [place] Call your surgeon's office if any of the following occur: Fever 101 degrees F or greater Excessive bleeding or fluid from the incision site. Pain that increases over time without aid from the medications Redness, warmth, or pus draining from incision sites Persistent nausea or inability to take in liquids Severe misshapen area that underwent the operation.  AMBULATORY SURGERY  DISCHARGE INSTRUCTIONS   The drugs that you were given will stay in your system until tomorrow so for the next 24 hours you should not:  Drive an automobile Make any legal decisions Drink any alcoholic beverage   You may resume regular meals tomorrow.  Today it is better to start with liquids and gradually work up to solid foods.  You may eat anything you prefer, but it is better to start with liquids, then soup and crackers, and gradually work up to solid foods.   Please notify your doctor immediately if you have any unusual bleeding, trouble breathing, redness and pain at the surgery site,  drainage, fever, or pain not relieved by medication.    Additional Instructions:        Please contact your physician with any problems or Same Day Surgery at [phone], Monday through Friday 6 am to 4 pm, or [place] [facility] number at [phone].

## 2023-01-28 ENCOUNTER — Encounter: Payer: BC Managed Care – PPO | Admitting: Surgical

## 2023-01-28 ENCOUNTER — Encounter: Payer: Self-pay | Admitting: General Surgery

## 2023-01-28 LAB — SURGICAL PATHOLOGY

## 2023-01-29 ENCOUNTER — Ambulatory Visit: Payer: BC Managed Care – PPO | Admitting: Internal Medicine

## 2023-01-29 ENCOUNTER — Ambulatory Visit: Payer: BC Managed Care – PPO | Admitting: Physician Assistant

## 2023-01-29 DIAGNOSIS — N62 Hypertrophy of breast: Secondary | ICD-10-CM

## 2023-01-29 NOTE — Progress Notes (Signed)
This is a 42 year old female status post bilateral breast reduction by Dr. Marla Roe on 01/27/2023. The patient is located in [place], I am currently at our office in [place].  Postoperatively patient notes she is done well.  She notes having to use one of her prescribed pain medication at night but has not had any need to use any further.  She notes the left seems slightly more swollen when compared to the right but this is getting better.  She denies any fever..  Exam:  Phone encounter, patient speaking in full sentences no acute distress.  Overall the patient is doing very well from a postoperative standpoint, she has a scheduled follow-up on 02/05/2023 with our office.  She was given strict return precautions, she verbalized understanding and agreement to today's plan had no further questions or concerns.

## 2023-02-05 ENCOUNTER — Ambulatory Visit (INDEPENDENT_AMBULATORY_CARE_PROVIDER_SITE_OTHER): Payer: BC Managed Care – PPO | Admitting: Surgical

## 2023-02-05 ENCOUNTER — Encounter: Payer: Self-pay | Admitting: Surgical

## 2023-02-05 DIAGNOSIS — N62 Hypertrophy of breast: Secondary | ICD-10-CM

## 2023-02-05 DIAGNOSIS — N63 Unspecified lump in unspecified breast: Secondary | ICD-10-CM

## 2023-02-05 DIAGNOSIS — G8929 Other chronic pain: Secondary | ICD-10-CM

## 2023-02-05 NOTE — Progress Notes (Signed)
Patient is a very pleasant 41 year old female here for follow-up after bilateral breast reduction with Dr. Marla Roe in conjunction with left breast biopsy with radiofrequency localizer with Dr. Windell Moment on 01/27/2023.  She is 9 days postop.  She reports that overall she has been feeling well, but does have some itching and irritation to her bilateral breast as well as discomfort and fullness sensation to bilateral breast.  She is not having any infectious symptoms at this time.  Patient reports she discussed pathology report of left breast biopsy with Dr. Windell Moment.  Chaperone present on exam On exam bilateral NAC's are viable, bilateral breast incisions are overall intact.  She does have significant irritation surrounding the bilateral areola.  Erythema is present, but it is not cellulitic in nature. She has fullness and tenderness to bilateral breast.  With palpation of bilateral breast, she does have fullness and firmness, suspect a subcutaneous fluid collection is present. She is also has some blisters of bilateral breasts where the Steri-Strip are in place.  A/P:  Steri-Strips were removed, blisters were noted, recommend Vaseline and gauze to these areas on bilateral breast. Recommend application of Vaseline to bilateral incisions to help remove the Dermabond over the next few days as this is likely causing some irritation/hypersensitivity.  Patient has bilateral hematomas present, right greater than left.  I was able to drain 140 cc of dark sanguinous fluid from the right breast and 35 cc of dark sanguinous fluid from the left breast.  Sterile technique was used with a butterfly needle.  Patient tolerated this well  She had significant improvement in the firmness of bilateral breast and immediate relief.  In regards to the irritation and erythema of bilateral breast, suspect this is hypersensitivity related to Dermabond and Steri-Strips, however recommend continue to monitor this area  closely.  She can use a mild topical steroid in the peri-incisional area, but avoid placing over the incisions.  I will send her prescription for antibiotic for her to hold and have available in case the erythema spreads, she develops fevers chills or any other infectious symptoms.  Patient was agreeable, she is scheduled for follow-up in 2 weeks, we discussed follow-up sooner if she notices any worsening symptoms or changes.

## 2023-02-16 ENCOUNTER — Encounter: Payer: Self-pay | Admitting: Physician Assistant

## 2023-02-16 ENCOUNTER — Ambulatory Visit (INDEPENDENT_AMBULATORY_CARE_PROVIDER_SITE_OTHER): Payer: BC Managed Care – PPO | Admitting: Physician Assistant

## 2023-02-16 VITALS — BP 123/81 | HR 95

## 2023-02-16 DIAGNOSIS — N62 Hypertrophy of breast: Secondary | ICD-10-CM

## 2023-02-16 NOTE — Progress Notes (Signed)
This is a very pleasant 42 year old female seen in our office for follow-up evaluation status post bilateral breast reduction with Dr. Marla Roe in conjunction with left breast biopsy with radiofrequency localizer with Dr. Windell Moment on 01/27/2023.    Since her last office visit she notes she has been doing well.  At her last office visit she did have 140 cc of dark sanguinous fluid from the right breast and 35 cc of dark sanguinous fluid from the left breast drained.  She noticed yesterday that she had some dark drainage from the left vertical limb.  She denies any surrounding redness or warmth, she denies any fever.  She notes the pain is improving.    Chaperone present.  On exam bilateral breasts are round, there is some fullness noted of the left breast with some dark sanguinous drainage out of the vertical limb, no surrounding redness, no warmth to touch, some underlying fluctuance noted.  Bilateral NAC's are viable, incisions are clean dry and intact.  Overall the patient is doing well.  She did have a fluid collection within the left breast.  I discussed the risk and benefits of drainage at the bedside.  She elected to proceed, was able to get approximately 60 cc of dark sanguinous fluid from the left breast.  The patient tolerated this well.  She has no signs of infection on exam.  The patient has a follow-up visit on Friday I think this is reasonable for close follow-up.  She Meritene sooner as needed.  She verbalized understanding and agreement to today's plan had no further questions or concerns.

## 2023-02-19 ENCOUNTER — Ambulatory Visit (INDEPENDENT_AMBULATORY_CARE_PROVIDER_SITE_OTHER): Payer: BC Managed Care – PPO | Admitting: Plastic Surgery

## 2023-02-19 ENCOUNTER — Encounter: Payer: Self-pay | Admitting: Plastic Surgery

## 2023-02-19 VITALS — BP 120/83 | HR 88 | Ht 65.0 in | Wt 179.0 lb

## 2023-02-19 DIAGNOSIS — N62 Hypertrophy of breast: Secondary | ICD-10-CM

## 2023-02-19 NOTE — Progress Notes (Signed)
The patient is a 42 year old female here for follow-up on her breast reduction.  The patient had over 500 g removed from the breast in March 6 in combination with an excision.  Overall she is doing really well there may be a little bit of a seroma on the left side.  I was not able to drain it today it was really tender.  Going to have her come back next week for reevaluation.  In the meantime continue with the sports bra.

## 2023-02-25 NOTE — Progress Notes (Signed)
Patient is a pleasant 42 year old female with PMH of macromastia s/p bilateral breast reduction at time of left-sided breast biopsy and radiofrequency localizer performed 01/27/2023 by Dr. Ulice Bold and Dr. Hazle Quant who presents to clinic for postoperative follow-up.  She was last seen here in clinic on 02/19/2023 Dr. Ulice Bold.  At that time, probable seroma noted on the left side, but unable to be aspirated at that time.  It had been aspirated twice previously.  Plan was for continued compressive garments and return in 1 week for reevaluation.  Today, patient states that it started spontaneously draining shortly after her last encounter.  She states that is coming from a small hole in the middle of her vertical incision.  She describes a "rusty" drainage.  She reports that the drainage was initially cumbersome, but is increasingly improving with less volume output.  Patient reports that the previous aspirations yielded dark blood.  She denies any malodor or overt purulence.  She states that the swelling in the left side has improved dramatically since her early postoperative recovery.  She also feels as though her breasts are softening on each side.  Denies any issues on the right side.  Denies any fevers, leg swelling, or breast pain.  On exam, breasts have good shape.  Right side appears just slightly larger than the left, similar to preoperative state.  NAC's appear healthy, viable.  She does have old ecchymoses around the left NAC.  Area of firmness just behind the left NAC that is discrete.  Remainder of breasts feel soft.  Small, less than 1 cm incisional wound on vertical limb left breast from which there is expressible serosanguineous drainage.  No surrounding erythema or induration.  No obvious subcutaneous fluid collections noted.  Differential for her small area of firmness is fat necrosis versus hardened small hematoma.  Based on her reported history as well as her exam today, suspect that  she had a small hematoma behind left NAC postoperatively that is now beginning to soften and drain spontaneously.  Low suspicion for any subcutaneous seroma amenable to aspiration.  Informed patient that it would likely continue to drain as it softens and that she can assist with the process by performing gentle massage of the area of firmness.  There is no evidence on exam concerning for infection.  She denies any pain symptoms and is otherwise doing quite well.  She states that overall she is pleased.  Picture(s) obtained of the patient and placed in the chart were with the patient's or guardian's permission.  Continue with ABD pads or maxipads as needed for drainage.  Continue with compressive garments.  Return in 2 weeks for follow-up.

## 2023-02-26 ENCOUNTER — Ambulatory Visit (INDEPENDENT_AMBULATORY_CARE_PROVIDER_SITE_OTHER): Payer: BC Managed Care – PPO | Admitting: Internal Medicine

## 2023-02-26 ENCOUNTER — Encounter: Payer: Self-pay | Admitting: Physician Assistant

## 2023-02-26 ENCOUNTER — Ambulatory Visit (INDEPENDENT_AMBULATORY_CARE_PROVIDER_SITE_OTHER): Payer: BC Managed Care – PPO | Admitting: Physician Assistant

## 2023-02-26 VITALS — BP 122/70 | HR 90 | Temp 98.0°F | Resp 16 | Ht 65.0 in | Wt 177.8 lb

## 2023-02-26 VITALS — BP 125/94 | HR 95

## 2023-02-26 DIAGNOSIS — N63 Unspecified lump in unspecified breast: Secondary | ICD-10-CM

## 2023-02-26 DIAGNOSIS — F439 Reaction to severe stress, unspecified: Secondary | ICD-10-CM | POA: Diagnosis not present

## 2023-02-26 DIAGNOSIS — Z9889 Other specified postprocedural states: Secondary | ICD-10-CM

## 2023-02-26 NOTE — Progress Notes (Unsigned)
Subjective:    Patient ID: Tara Davila, female    DOB: [birth year], 42 y.o.   MRN: [id number]  Patient here for  Chief Complaint  Patient presents with   Medical Management of Chronic Issues    HPI Here for scheduled follow up.  Is s/p breast surgery - breast reduction.  Doing well s/p surgery.  Continues to f/u with surgery.  Has had some drainage.  Pain - improved.  Has f/u in 2 weeks.  Reports doing relatively well.  No chest pain or sob reported.  No abdominal pain or bowel change reported.  Handling stress.    Past Medical History:  Diagnosis Date   Allergy    Anemia    Asthma    well controlled   GERD (gastroesophageal reflux disease)    occ   History of chicken pox    Hx: UTI (urinary tract infection)    Past Surgical History:  Procedure Laterality Date   BREAST BIOPSY Left 12/24/2021   left breast stereo x clip neg   BREAST BIOPSY WITH RADIO FREQUENCY LOCALIZER Left 01/27/2023   Procedure: BREAST BIOPSY WITH RADIO FREQUENCY LOCALIZER;  Surgeon: Carolan Shiverintron-Diaz, Edgardo, MD;  Location: [facility] OR[facility];  Service: General;  Laterality: Left;   BREAST REDUCTION SURGERY Bilateral 01/27/2023   Procedure: MAMMARY REDUCTION  (BREAST);  Surgeon: Peggye Formillingham, Claire S, DO;  Location: [facility] OR[facility];  Service: Plastics;  Laterality: Bilateral;   WISDOM TOOTH EXTRACTION     Family History  Problem Relation Age of Onset   Hyperlipidemia Mother    Hypertension Father    Cancer Maternal Grandmother        breast   Breast cancer Maternal Grandmother [id number]   Hyperlipidemia Maternal Grandfather    Heart disease Maternal Grandfather    Hypertension Maternal Grandfather    Stroke Paternal Grandmother    Breast cancer Paternal Aunt    Social History   Socioeconomic History   Marital status: Married    Spouse name: Not on file   Number of children: Not on file   Years of education: Not on file   Highest education level: Bachelor's degree (e.g., BA, AB, BS)  Occupational History   Not on  file  Tobacco Use   Smoking status: Never   Smokeless tobacco: Never  Vaping Use   Vaping Use: Never used  Substance and Sexual Activity   Alcohol use: Yes    Comment: rare   Drug use: No   Sexual activity: Not on file  Other Topics Concern   Not on file  Social History Narrative   Lives at home   Social Determinants of Health   Financial Resource Strain: Low Risk  (02/24/2023)   Overall Financial Resource Strain (CARDIA)    Difficulty of Paying Living Expenses: Not hard at all  Food Insecurity: No Food Insecurity (02/24/2023)   Hunger Vital Sign    Worried About Running Out of Food in the Last Year: Never true    Ran Out of Food in the Last Year: Never true  Transportation Needs: No Transportation Needs (02/24/2023)   PRAPARE - Administrator, Civil ServiceTransportation    Lack of Transportation (Medical): No    Lack of Transportation (Non-Medical): No  Physical Activity: Insufficiently Active (02/24/2023)   Exercise Vital Sign    Days of Exercise per Week: 3 days    Minutes of Exercise per Session: 30 min  Stress: Stress Concern Present (02/24/2023)   [employer] - Occupational Stress Questionnaire    Feeling  of Stress : Very much  Social Connections: Socially Integrated (02/24/2023)   Social Connection and Isolation Panel [NHANES]    Frequency of Communication with Friends and Family: More than three times a week    Frequency of Social Gatherings with Friends and Family: Once a week    Attends Religious Services: More than 4 times per year    Active Member of [employer] or Organizations: Yes    Attends Engineer, structural: More than 4 times per year    Marital Status: Married     Review of Systems  Constitutional:  Negative for appetite change and unexpected weight change.  HENT:  Negative for congestion and sinus pressure.   Respiratory:  Negative for cough, chest tightness and shortness of breath.   Cardiovascular:  Negative for chest pain and palpitations.   Gastrointestinal:  Negative for abdominal pain, diarrhea, nausea and vomiting.  Genitourinary:  Negative for difficulty urinating and dysuria.  Musculoskeletal:  Negative for joint swelling and myalgias.  Skin:  Negative for color change and rash.  Neurological:  Negative for dizziness and headaches.  Psychiatric/Behavioral:  Negative for agitation and dysphoric mood.        Objective:     BP 122/70   Pulse 90   Temp 98 F (36.7 C)   Resp 16   Ht 5\' 5"  (1.651 m)   Wt 177 lb 12.8 oz (80.6 kg)   SpO2 99%   BMI 29.59 kg/m  Wt Readings from Last 3 Encounters:  02/26/23 177 lb 12.8 oz (80.6 kg)  02/19/23 179 lb (81.2 kg)  01/27/23 178 lb 9.2 oz (81 kg)    Physical Exam Vitals reviewed.  Constitutional:      General: She is not in acute distress.    Appearance: Normal appearance.  HENT:     Head: Normocephalic and atraumatic.     Right Ear: External ear normal.     Left Ear: External ear normal.  Eyes:     General: No scleral icterus.       Right eye: No discharge.        Left eye: No discharge.     Conjunctiva/sclera: Conjunctivae normal.  Neck:     Thyroid: No thyromegaly.  Cardiovascular:     Rate and Rhythm: Normal rate and regular rhythm.  Pulmonary:     Effort: No respiratory distress.     Breath sounds: Normal breath sounds. No wheezing.  Abdominal:     General: Bowel sounds are normal.     Palpations: Abdomen is soft.     Tenderness: There is no abdominal tenderness.  Musculoskeletal:        General: No swelling or tenderness.     Cervical back: Neck supple. No tenderness.  Lymphadenopathy:     Cervical: No cervical adenopathy.  Skin:    Findings: No erythema or rash.  Neurological:     Mental Status: She is alert.  Psychiatric:        Mood and Affect: Mood normal.        Behavior: Behavior normal.      Outpatient Encounter Medications as of 02/26/2023  Medication Sig   albuterol (VENTOLIN HFA) 108 (90 Base) MCG/ACT inhaler Inhale 2 puffs into  the lungs as needed.   buPROPion (WELLBUTRIN XL) 150 MG 24 hr tablet TAKE 1 TABLET BY MOUTH EVERY DAY (Patient taking differently: 150 mg at bedtime.)   cetirizine (ZYRTEC) 10 MG tablet Take 1 tablet by mouth at bedtime.   mometasone (ELOCON)  0.1 % cream Apply 1 application topically as needed.   montelukast (SINGULAIR) 10 MG tablet Take 1 tablet by mouth at bedtime.   sodium chloride (OCEAN) 0.65 % SOLN nasal spray Place 1 spray into both nostrils as needed for congestion.   No facility-administered encounter medications on file as of 02/26/2023.     Lab Results  Component Value Date   WBC 11.2 (H) 01/21/2023   HGB 13.2 01/21/2023   HCT 39.9 01/21/2023   PLT 255 01/21/2023   GLUCOSE 84 04/23/2022   CHOL 153 04/23/2022   TRIG 92.0 04/23/2022   HDL 52.70 04/23/2022   LDLCALC 82 04/23/2022   ALT 16 04/23/2022   AST 14 04/23/2022   NA 139 04/23/2022   K 3.7 04/23/2022   CL 104 04/23/2022   CREATININE 0.76 04/23/2022   BUN 12 04/23/2022   CO2 26 04/23/2022   TSH 3.00 04/23/2022    MM BREAST SURGICAL SPECIMEN  Result Date: 01/27/2023 CLINICAL DATA:  Status post RF ID tag localized LEFT breast surgical excision. EXAM: SPECIMEN RADIOGRAPH OF THE LEFT BREAST COMPARISON:  Previous exam(s). FINDINGS: Status post excision of the LEFT breast. The RF ID tag and X shaped clip are present within the specimen. IMPRESSION: Specimen radiograph of the LEFT breast. Electronically Signed   By: Meda KlinefelterStephanie  Peacock M.D.   On: 01/27/2023 09:31      Assessment & Plan:  Stress Assessment & Plan: Overall appears to be handling things relatively well.  Continue wellbutrin. Follow.    Breast nodule Assessment & Plan: Removed with recent breast surgery.  S/p breast reduction.  Continue f/u with surgery.  Follow.        Dale Durhamharlene Arvin Abello, MD

## 2023-02-27 ENCOUNTER — Encounter: Payer: Self-pay | Admitting: Internal Medicine

## 2023-02-27 NOTE — Assessment & Plan Note (Signed)
Overall appears to be handling things relatively well.  Continue wellbutrin. Follow.  

## 2023-02-27 NOTE — Assessment & Plan Note (Signed)
Removed with recent breast surgery.  S/p breast reduction.  Continue f/u with surgery.  Follow.

## 2023-03-11 NOTE — Progress Notes (Signed)
Patient is a pleasant 42 year old female with PMH of macromastia s/p bilateral breast reduction at time of left-sided breast biopsy and radiofrequency localizer performed 01/27/2023 by Dr. Ulice Bold and Dr. Hazle Quant who presents to clinic for postoperative follow-up.   She was last seen here in clinic on 02/26/2023.  At that time, she reported "rusty" drainage from a small hole in the middle of vertical incision on the left side.  She states that previous aspirations yielded dark blood.  On exam, breasts had good shape.  Right side appeared minimally larger than the left, similar to preoperative state.  NAC's appeared healthy and viable.  Area of firmness behind left NAC that is discrete.  Small, less than 1 cm incisional wound noted on the vertical limb left breast.  Suspected that the area of firmness was reflective of small hardened hematoma versus fat necrosis.  Recommended gentle massage as well as continued activity modifications and compressive garments.  Return in 2 weeks for follow-up.  Today, patient is doing okay.  She states that she continues to have some drainage from an incisional opening on vertical limb left breast.  However, reports that the drainage is far less in volume than what she was experiencing at her most recent encounter 2 weeks ago.  She continues to deny any pain, worsening swelling, redness, or other symptoms.  She is otherwise feeling recovered.  Patient reports that she has been massaging the area of firmness on the left breast regularly as well as applying Vaseline to her incisions throughout.  On exam, she does have a 1 cm incisional wound on vertical limb left breast.  Remainder of incisions throughout are CDI.  NACs are healthy.  There has been softening of the left breast from just behind the NAC.  Small amount of what appears to be hypergranular tissue versus slough protruding from the wound.  Silver nitrate cautery performed.  The tissue easily reduces behind the skin  when probed.  After cautery was performed, applied 2 x 2 gauze secured with a Band-Aid.  Return in 2 weeks for follow-up.  She can transition to silicone scar gels/sheets to her right breast incisions.  Continued Vaseline on the left.  Picture(s) obtained of the patient and placed in the chart were with the patient's or guardian's permission.

## 2023-03-12 ENCOUNTER — Ambulatory Visit (INDEPENDENT_AMBULATORY_CARE_PROVIDER_SITE_OTHER): Payer: BC Managed Care – PPO | Admitting: Physician Assistant

## 2023-03-12 ENCOUNTER — Encounter: Payer: Self-pay | Admitting: Physician Assistant

## 2023-03-12 VITALS — BP 120/70 | HR 97 | Ht 65.0 in | Wt 177.0 lb

## 2023-03-12 DIAGNOSIS — T8131XA Disruption of external operation (surgical) wound, not elsewhere classified, initial encounter: Secondary | ICD-10-CM

## 2023-03-12 DIAGNOSIS — Z9889 Other specified postprocedural states: Secondary | ICD-10-CM

## 2023-03-25 ENCOUNTER — Encounter: Payer: Self-pay | Admitting: Student

## 2023-03-25 ENCOUNTER — Ambulatory Visit (INDEPENDENT_AMBULATORY_CARE_PROVIDER_SITE_OTHER): Payer: BC Managed Care – PPO | Admitting: Student

## 2023-03-25 VITALS — BP 124/88 | HR 96

## 2023-03-25 DIAGNOSIS — S21002A Unspecified open wound of left breast, initial encounter: Secondary | ICD-10-CM | POA: Diagnosis not present

## 2023-03-25 DIAGNOSIS — Z9889 Other specified postprocedural states: Secondary | ICD-10-CM

## 2023-03-25 NOTE — Progress Notes (Signed)
Patient is a 42 year old female who underwent bilateral breast reduction with Dr. Ulice Bold on 01/27/2023.  She also underwent left-sided breast biopsy and radiofrequency localizer with Dr. Hazle Quant at the same time as her breast reduction.  She is approximately 8 weeks postop.  Patient presents to the clinic today for postoperative follow-up.  Patient was last seen in the clinic on 03/12/2023.  At this visit, patient reported she was doing okay.  She reported that she was having some drainage from incisional opening on the vertical limb of the left breast.  She reported that the drainage had been decreasing since the previous visit.  On exam, patient had a 1 cm incisional wound on the vertical limb of the left breast.  The remainder of the incisions were clean dry and intact throughout.  NAC's were healthy.  There was a small amount of hypergranular tissue versus slough protruding from the wound.  Silver nitrate cautery was performed.  A 2 x 2 gauze was secured in place with a Band-Aid.  Plan is for patient to follow-up in 2 weeks.  It was discussed with the patient that she could transition to silicone scar gel/sheets to her right breast incision and continue Vaseline to her left.  Today, patient reports she is doing well.  She states that she still experiencing some drainage from the wound to her left vertical limb incision.  She reports that she feels the drainage has slowed down a little bit, but the wound is still approximately the same size.  She denies any redness or pain in the area.  She denies any fevers or chills.  Chaperone present on exam.  On exam, patient is sitting upright in no acute distress.  Breasts are soft bilaterally.  There is no overlying erythema.  NAC's are viable bilaterally.  There is a 1 cm x 1 cm x 0.5 cm wound to the left vertical limb incision.  There is some superficial slough noted.  This was debrided.  Within the wound, there appears to be some deeper slough versus some  possible fat necrosis.  There was some minimal drainage on exam when the area was palpated that appear to be consistent with some possible liquefied fat necrosis and some minimal serosanguineous drainage.  There is some firmness surrounding the wound that appears to be consistent with scarring or fat necrosis.  All other incisions are clean dry and intact.  There are no signs of infection on exam.  I discussed with the patient that it appears she may have some fat necrosis to the area near the wound.  I encouraged her to gently massage the area.  We will have patient apply calcium alginate and Mepilex border dressing over the wound.  Patient states she had some irritation to adhesives after surgery, but she states that she would like to try the Mepilex border dressing to see if she will not react at this time.  I discussed with the patient that if she experiences any irritation or itchiness, to take the dressing off.  I told her that if the Mepilex border dressing causes her irritation, she may reinforce the area with gauze and an ABD pad.  Patient to follow back up with Dr. Ulice Bold in 2 weeks.  I instructed the patient to call in the meantime if she has any questions or concerns.  Pictures were obtained of the patient and placed in the chart with the patient's or guardian's permission.

## 2023-03-31 ENCOUNTER — Telehealth: Payer: Self-pay

## 2023-03-31 NOTE — Telephone Encounter (Signed)
Faxed wound supply order to [facility] with demographics, insurance and most recent ov note attached. Received confirmation fax and correspondence that [facility] has provided service for the patient; no further action is required. Forwarded order to front desk for batch scanning.

## 2023-04-01 ENCOUNTER — Encounter: Payer: BC Managed Care – PPO | Admitting: Physician Assistant

## 2023-04-06 ENCOUNTER — Encounter: Payer: Self-pay | Admitting: Plastic Surgery

## 2023-04-06 ENCOUNTER — Ambulatory Visit (INDEPENDENT_AMBULATORY_CARE_PROVIDER_SITE_OTHER): Payer: BC Managed Care – PPO | Admitting: Plastic Surgery

## 2023-04-06 VITALS — BP 124/72 | HR 108 | Ht 65.0 in | Wt 177.0 lb

## 2023-04-06 DIAGNOSIS — Z9889 Other specified postprocedural states: Secondary | ICD-10-CM

## 2023-04-06 DIAGNOSIS — N62 Hypertrophy of breast: Secondary | ICD-10-CM

## 2023-04-06 NOTE — Progress Notes (Signed)
   Subjective:    Patient ID: Tara Davila, female    DOB: 05-Feb-1981, 42 y.o.   MRN: [id number]  Patient is a 42 year old female here for follow-up after undergoing bilateral breast reduction.  She is 5 feet 5 inches tall and weighs around 183 pounds.  March 2024 she underwent around 500 g of breast reduction bilaterally.  She has had some trouble with healing on the left vertical limb.  She has been using some collagen.  It does not look infected it is not red but it is being very slow to heal.      Review of Systems  Constitutional: Negative.   HENT: Negative.    Eyes: Negative.   Respiratory: Negative.    Cardiovascular: Negative.   Gastrointestinal: Negative.   Endocrine: Negative.   Genitourinary: Negative.   Musculoskeletal: Negative.        Objective:   Physical Exam Constitutional:      Appearance: Normal appearance.  Cardiovascular:     Rate and Rhythm: Normal rate.     Pulses: Normal pulses.  Pulmonary:     Effort: Pulmonary effort is normal.  Musculoskeletal:        General: Tenderness present. No swelling or deformity.  Skin:    General: Skin is warm.     Capillary Refill: Capillary refill takes less than 2 seconds.     Coloration: Skin is not jaundiced.     Findings: Lesion present.  Neurological:     Mental Status: She is alert and oriented to person, place, and time.  Psychiatric:        Mood and Affect: Mood normal.        Behavior: Behavior normal.        Thought Content: Thought content normal.        Judgment: Judgment normal.         Assessment & Plan:     ICD-10-CM   1. Symptomatic mammary hypertrophy  N62        The patient will add Vashe cleansing daily.  We will also look at doing an excision of the wound next week with primary closure.

## 2023-04-14 ENCOUNTER — Ambulatory Visit: Payer: BC Managed Care – PPO | Admitting: Plastic Surgery

## 2023-04-14 ENCOUNTER — Encounter: Payer: Self-pay | Admitting: Plastic Surgery

## 2023-04-14 VITALS — BP 130/86 | HR 92

## 2023-04-14 DIAGNOSIS — S21002A Unspecified open wound of left breast, initial encounter: Secondary | ICD-10-CM | POA: Diagnosis not present

## 2023-04-14 NOTE — Progress Notes (Signed)
Procedure Note  Preoperative Dx: wound left breast  Postoperative Dx: Same  Procedure: Excision of wound left breast with primary layered closure 1 x 2 cm  Anesthesia: Lidocaine 1% with 1:100,000 epinephrine   Description of Procedure: Risks and complications were explained to the patient.  Consent was confirmed and the patient understands the risks and benefits.  The potential complications and alternatives were explained and the patient consents.  The patient expressed understanding the option of not having the procedure and the risks of a scar.  Time out was called and all information was confirmed to be correct.    The area was prepped and drapped.  Lidocaine 1% with epinephrine was injected in the subcutaneous area.  After waiting several minutes for the local to take affect a #15 blade was used to excise the 1 x 2 cm area of wound at the vertical limb of the left breast.  A 3-0 Monocryl was used to close the deep layers with simple interrupted stitches.  The skin edges were reapproximated with 4-0 Monocryl vertical mattress sutures.  A dressing was applied.  The patient was given instructions on how to care for the area and a follow up appointment.  Tara Davila tolerated the procedure well and there were no complications.

## 2023-04-20 ENCOUNTER — Other Ambulatory Visit: Payer: Self-pay | Admitting: Internal Medicine

## 2023-04-21 NOTE — Progress Notes (Signed)
Patient is a pleasant 42 year old female with PMH of macromastia s/p bilateral breast reduction at time of left-sided breast biopsy and radiofrequency localizer performed 01/27/2023 by Dr. Ulice Bold and Dr. Hazle Quant.  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 Ulice Bold who returns to clinic for postprocedural follow-up.  Reviewed procedure note and the wound was closed with 3-0 Monocryl deep as well as 4-0 Monocryl vertical mattress sutures on the skin.  Today, patient is doing well.  She denies any issues or complications since her wound closure was performed 9 days ago.  She states that it was mildly red and itchy, but denies any significant discomfort, malodor, drainage, incisional wounds, or other concern.  On exam, the wound excision and closure site appeared to be well-approximated and healing nicely.  Mattress sutures noted throughout the length of the incision.  Mildly erythematous out laterally, but no induration or tenderness on exam.  No malodor or drainage noted.  Discussed removing sutures today versus leaving them for another week.  Given her chronic vertical limb wound, she would prefer to err on the side of caution and leave them in for an additional week.  Discussed with patient that the risk of prolonged suture placement would be worsening scarring and potential for train tracking based on the type of closure.  She is completely okay with this and would prefer to try to mitigate any scarring with silicone management after it is well-healed.  Will plan to have her return in 1 week for suture removal/trimming.  She understands that they are absorbable, but that it can take over a month before they break down and absorb.  She will call clinic should she have any questions or concerns in the interim.

## 2023-04-23 ENCOUNTER — Ambulatory Visit (INDEPENDENT_AMBULATORY_CARE_PROVIDER_SITE_OTHER): Payer: BC Managed Care – PPO | Admitting: Physician Assistant

## 2023-04-23 VITALS — BP 129/84 | HR 98

## 2023-04-23 DIAGNOSIS — S21009D Unspecified open wound of unspecified breast, subsequent encounter: Secondary | ICD-10-CM

## 2023-04-23 DIAGNOSIS — Z9889 Other specified postprocedural states: Secondary | ICD-10-CM

## 2023-04-29 NOTE — Progress Notes (Signed)
Patient is a pleasant 42 year old female with PMH of macromastia s/p bilateral breast reduction at time of left-sided breast biopsy and radiofrequency localizer performed 01/27/2023 by Dr. Ulice Bold and Dr. Hazle Quant.  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 Ulice Bold who returns to clinic for postprocedural follow-up.   She was last seen here in clinic on 04/23/2023.  At that time, she was 9 days post closure and on exam it appeared to be well-approximated, but decision was made to leave them in place for an additional week prior to removal.  Today, patient is doing well.  She feels prepared for suture removal.  No specific complaints.  She is eager to resume normal activities and go in the water.  On exam, the skin edges remain well-approximated.  The scattered mattress sutures are removed without complication or difficulty.  Informed patient that they were Monocryl sutures and any residual suture material that she notices should absorb without complication or difficulty, however they appear to all be removed.  No erythema or induration.  Discussed continued avoidance of submerging breast in water until there are no residual wounds.  Where the sutures were removed she has small pinpoint wounds, suspect that they will heal without difficulty in the next couple of weeks and then at that time she can go in the water.  We also discussed mitigation of UV injury as well as silicone scar gels and tapes.  No specific follow-up needed.  Patient is overall pleased with the results from her surgery back in March.  Picture(s) obtained of the patient and placed in the chart were with the patient's or guardian's permission.

## 2023-04-30 ENCOUNTER — Ambulatory Visit (INDEPENDENT_AMBULATORY_CARE_PROVIDER_SITE_OTHER): Payer: BC Managed Care – PPO | Admitting: Physician Assistant

## 2023-04-30 DIAGNOSIS — S21002D Unspecified open wound of left breast, subsequent encounter: Secondary | ICD-10-CM | POA: Diagnosis not present

## 2023-04-30 DIAGNOSIS — S21009D Unspecified open wound of unspecified breast, subsequent encounter: Secondary | ICD-10-CM

## 2023-05-14 ENCOUNTER — Ambulatory Visit: Payer: BC Managed Care – PPO | Admitting: Surgical

## 2023-05-14 ENCOUNTER — Other Ambulatory Visit (HOSPITAL_COMMUNITY)
Admission: RE | Admit: 2023-05-14 | Discharge: 2023-05-14 | Disposition: A | Discharge status: Home / Self Care | Payer: BC Managed Care – PPO | Attending: Surgical | Admitting: Surgical

## 2023-05-14 ENCOUNTER — Encounter: Payer: Self-pay | Admitting: Surgical

## 2023-05-14 VITALS — BP 119/84 | HR 92 | Ht 63.0 in | Wt 177.0 lb

## 2023-05-14 DIAGNOSIS — Z9889 Other specified postprocedural states: Secondary | ICD-10-CM | POA: Insufficient documentation

## 2023-05-14 DIAGNOSIS — D242 Benign neoplasm of left breast: Secondary | ICD-10-CM | POA: Insufficient documentation

## 2023-05-14 DIAGNOSIS — N6459 Other signs and symptoms in breast: Secondary | ICD-10-CM | POA: Diagnosis not present

## 2023-05-14 MED ORDER — SULFAMETHOXAZOLE-TRIMETHOPRIM 800-160 MG PO TABS: 1.0000 | ORAL_TABLET | Freq: Two times a day (BID) | ORAL | 0 refills | Status: AC

## 2023-05-14 NOTE — Progress Notes (Signed)
   Referring Provider Tara [place], MD [address] [place],  [place] [address]   CC: No chief complaint on file.     Tara Davila is an 42 y.o. female.  HPI: Patient is a 42 year old female here for follow-up on her bilateral breast reduction in conjunction with a left breast biopsy with radiofrequency localization by Dr. Ulice Davila and Dr. Hazle Davila on 01/27/2023.  Postoperatively she developed a wound of the left breast and underwent excision and primary closure with Dr. Ulice Davila on 04/14/2023.  She was last seen in the office on 04/30/2023, was doing well at that time.  Today she reports that she was overall doing well and then noticed a swelling fluid collection at the midpoint of her breast just below the nipple areola.  She reports that it spontaneously drained.  She reports that she has had firmness of her breast since her procedure in March and this has not improved.  She reports she is not having any infectious symptoms at this time, she reports she is traveling to [place] this weekend for vacation with her family for a week.  Patient provided me with a picture of the swelling fluid, it appeared to be an abscess that was approximately 1 x 1 cm directly over the vertical limb incision just below the nipple areolar complex.   Review of Systems General: no fevers/chills  Physical Exam    04/23/2023    1:27 PM 04/14/2023    2:52 PM 04/06/2023    8:12 AM  Vitals with BMI  Height   5\' 5"   Weight   177 lbs  BMI   29.45  Systolic 129 130 811  Diastolic 84 86 72  Pulse 98 92 108    General:  No acute distress,  Alert and oriented, Non-Toxic, Normal speech and affect Left breast wound along the vertical limb is noted, pinpoint opening.  There is thinning skin where the fluid collection was present.  Purulent drainage versus fat necrosis is expressed with palpation.  There is no surrounding erythema or cellulitic changes.  There is underlying  firmness noted.  I do not appreciate any significant subcutaneous fluid collection noted.  She does not have significant tenderness.  Assessment/Plan Patient is a very pleasant 42 year old female here for evaluation of a left breast wound, she underwent bilateral breast reduction in conjunction with a left breast biopsy with radiofrequency localization by Dr. Ulice Davila and Dr. Hazle Davila over 3 months ago, subsequently underwent excision of a left breast wound and primary closure on 04/14/2023.  Unfortunately she most recently developed a fluid collection which spontaneously drained yellow fluid per patient.  Culture was obtained today of drainage for further evaluation.  I do not see any signs of overt infection on exam, however patient is traveling out of state for the next week so we will provide her with a prescription for Bactrim in case she develops infectious symptoms or signs over the week.  She is encouraged to call with symptomatic changes or concerns.  She knows to not take the antibiotic until after discussing any changes with our office.  Pictures were obtained of the patient and placed in the chart with the patient's or guardian's permission.  Recommend following up in approximately 10 days when she returns from her trip.  Tara Davila 05/14/2023, 8:32 AM

## 2023-05-17 LAB — AEROBIC/ANAEROBIC CULTURE W GRAM STAIN (SURGICAL/DEEP WOUND)

## 2023-05-25 ENCOUNTER — Ambulatory Visit: Payer: BC Managed Care – PPO | Admitting: Surgical

## 2023-05-25 VITALS — BP 125/85 | HR 114

## 2023-05-25 DIAGNOSIS — S21009D Unspecified open wound of unspecified breast, subsequent encounter: Secondary | ICD-10-CM | POA: Diagnosis not present

## 2023-05-25 DIAGNOSIS — Z9889 Other specified postprocedural states: Secondary | ICD-10-CM

## 2023-05-25 MED ORDER — AMOXICILLIN-POT CLAVULANATE 875-125 MG PO TABS: 1.0000 | ORAL_TABLET | Freq: Two times a day (BID) | ORAL | 0 refills | Status: AC

## 2023-05-25 NOTE — Progress Notes (Signed)
Patient is a very pleasant 42 year old female here for follow-up on her bilateral breast reduction in conjunction with left breast biopsy with radiofrequency localization by Dr. Ulice Bold with Dr. Hazle Quant on 01/27/2023.  Postoperatively she developed a wound to the left breast and underwent excision and primary closure on 04/14/2023  I last saw her in the office on 05/14/2023, at that time she reported a fluid collection along the vertical limb of the left breast that spontaneously drained.  Cultures were obtained. Cultures grew rare diphtheroids and moderate Finegoldia magna.  No susceptibilities were provided due to this routinely not being available.  Patient reports that she is overall feeling well, she feels as if the area has not worsened.  She is not having any infectious symptoms.  She does report that she still notices firmness in the left breast.  She has continued to have some mild drainage from the wound.  She did not take any of the antibiotics.  Chaperone present on exam On exam the left breast wound is moderately improved, there is still an opening that is approximately 3 x 1.5 mm.  There is no surrounding cellulitic changes.  There is some surrounding redness associated with the wound healing but it does not appear infectious in etiology.  There is some firmness under the breast wound present.   A/P:  Recommend continuing with dressing changes daily, I do not see any signs of active infection at this time.  We discussed that the firmness is likely reactive to the small abscess that she had versus fat necrosis present from the reduction/biopsy.  Discussed patient's pathology with pharmacy, discussed that these are typically contaminants, however can be treated with Augmentin or any amoxicillin.  I called the patient after her appointment today to discuss this with her, I discussed with her that since she is doing well clinically I do not feel as if antibiotics are necessary at this  time, however will send prescription in case she develops infectious symptoms.  Patient's case was discussed with Dr. Ulice Bold who is in agreement with this plan.

## 2023-05-28 ENCOUNTER — Ambulatory Visit (INDEPENDENT_AMBULATORY_CARE_PROVIDER_SITE_OTHER): Payer: BC Managed Care – PPO | Admitting: Internal Medicine

## 2023-05-28 ENCOUNTER — Encounter: Payer: Self-pay | Admitting: Internal Medicine

## 2023-05-28 VITALS — BP 114/72 | HR 90 | Temp 98.2°F | Resp 16 | Ht 63.0 in | Wt 186.4 lb

## 2023-05-28 DIAGNOSIS — Z0000 Encounter for general adult medical examination without abnormal findings: Secondary | ICD-10-CM | POA: Diagnosis not present

## 2023-05-28 DIAGNOSIS — S21002A Unspecified open wound of left breast, initial encounter: Secondary | ICD-10-CM | POA: Diagnosis not present

## 2023-05-28 DIAGNOSIS — Z13220 Encounter for screening for lipoid disorders: Secondary | ICD-10-CM

## 2023-05-28 DIAGNOSIS — R635 Abnormal weight gain: Secondary | ICD-10-CM

## 2023-05-28 DIAGNOSIS — F439 Reaction to severe stress, unspecified: Secondary | ICD-10-CM

## 2023-05-28 DIAGNOSIS — Z713 Dietary counseling and surveillance: Secondary | ICD-10-CM

## 2023-05-28 NOTE — Assessment & Plan Note (Signed)
Physical today 05/28/23.  04/23/22 - PAP.  Mammogram as outlined.

## 2023-05-28 NOTE — Telephone Encounter (Signed)
Spoke with pt, recommend starting augmentin, recommend warm compresses. She feels well. She is not having any infectious symptoms.

## 2023-05-28 NOTE — Progress Notes (Signed)
Subjective:    Patient ID: Tara Davila, female    DOB: October 12, 1981, 42 y.o.   MRN: [id number]  Patient here for  Chief Complaint  Patient presents with   Annual Exam    HPI Here for a physical exam.  Reports she is doing relatively well.  Is s/p bilateral breast reduction in conjunction with left breast biopsy with radiofrequency localization by Dr. Ulice Bold with Dr. Hazle Quant on 01/27/2023. Postoperatively she developed a wound to the left breast and underwent excision and primary closure on 04/14/2023 Had f/u 05/25/23.  Noticed after the follow up appt - some drainage.  Developed "abscess" - left breast.  Called surgery to inform of above.  Started on augmentin. Has f/u next week.  Reports otherwise doing well.  Breathing stable.  No fever.  No increased congestion.  No vomiting or diarrhea reported.     Past Medical History:  Diagnosis Date   Allergy    Anemia    Asthma    well controlled   GERD (gastroesophageal reflux disease)    occ   History of chicken pox    Hx: UTI (urinary tract infection)    Past Surgical History:  Procedure Laterality Date   BREAST BIOPSY Left 12/24/2021   left breast stereo x clip neg   BREAST BIOPSY WITH RADIO FREQUENCY LOCALIZER Left 01/27/2023   Procedure: BREAST BIOPSY WITH RADIO FREQUENCY LOCALIZER;  Surgeon: Carolan Shiver, MD;  Location: [facility] ORS;  Service: General;  Laterality: Left;   BREAST REDUCTION SURGERY Bilateral 01/27/2023   Procedure: MAMMARY REDUCTION  (BREAST);  Surgeon: Peggye Form, DO;  Location: [facility] ORS;  Service: Plastics;  Laterality: Bilateral;   WISDOM TOOTH EXTRACTION     Family History  Problem Relation Age of Onset   Hyperlipidemia Mother    Hypertension Father    Cancer Maternal Grandmother        breast   Breast cancer Maternal Grandmother 56   Hyperlipidemia Maternal Grandfather    Heart disease Maternal Grandfather    Hypertension Maternal Grandfather    Stroke Paternal Grandmother     Breast cancer Paternal Aunt    Social History   Socioeconomic History   Marital status: Married    Spouse name: Not on file   Number of children: Not on file   Years of education: Not on file   Highest education level: Bachelor's degree (e.g., BA, AB, BS)  Occupational History   Not on file  Tobacco Use   Smoking status: Never   Smokeless tobacco: Never  Vaping Use   Vaping Use: Never used  Substance and Sexual Activity   Alcohol use: Yes    Comment: rare   Drug use: No   Sexual activity: Not on file  Other Topics Concern   Not on file  Social History Narrative   Lives at home   Social Determinants of Health   Financial Resource Strain: Low Risk  (02/24/2023)   Overall Financial Resource Strain (CARDIA)    Difficulty of Paying Living Expenses: Not hard at all  Food Insecurity: No Food Insecurity (02/24/2023)   Hunger Vital Sign    Worried About Running Out of Food in the Last Year: Never true    Ran Out of Food in the Last Year: Never true  Transportation Needs: No Transportation Needs (02/24/2023)   PRAPARE - Administrator, Civil Service (Medical): No    Lack of Transportation (Non-Medical): No  Physical Activity: Insufficiently Active (02/24/2023)   Exercise  Vital Sign    Days of Exercise per Week: 3 days    Minutes of Exercise per Session: 30 min  Stress: Stress Concern Present (02/24/2023)   [employer] of Occupational Health - Occupational Stress Questionnaire    Feeling of Stress : Very much  Social Connections: Socially Integrated (02/24/2023)   Social Connection and Isolation Panel [NHANES]    Frequency of Communication with Friends and Family: More than three times a week    Frequency of Social Gatherings with Friends and Family: Once a week    Attends Religious Services: More than 4 times per year    Active Member of [employer] or Organizations: Yes    Attends Engineer, structural: More than 4 times per year    Marital Status: Married      Review of Systems  Constitutional:  Negative for fatigue and unexpected weight change.  HENT:  Negative for congestion, sinus pressure and sore throat.   Eyes:  Negative for pain and visual disturbance.  Respiratory:  Negative for cough, chest tightness and shortness of breath.   Cardiovascular:  Negative for chest pain, palpitations and leg swelling.  Gastrointestinal:  Negative for abdominal pain, diarrhea, nausea and vomiting.  Genitourinary:  Negative for difficulty urinating and dysuria.  Musculoskeletal:  Negative for joint swelling and myalgias.  Skin:        Left breast - changes as outlined.   Neurological:  Negative for dizziness and headaches.  Hematological:  Negative for adenopathy. Does not bruise/bleed easily.  Psychiatric/Behavioral:  Negative for agitation and dysphoric mood.        Objective:     BP 114/72   Pulse 90   Temp 98.2 F (36.8 C)   Resp 16   Ht 5\' 3"  (1.6 m)   Wt 186 lb 6.4 oz (84.6 kg)   SpO2 99%   BMI 33.02 kg/m  Wt Readings from Last 3 Encounters:  05/28/23 186 lb 6.4 oz (84.6 kg)  05/14/23 177 lb (80.3 kg)  04/06/23 177 lb (80.3 kg)    Physical Exam Vitals reviewed.  Constitutional:      General: She is not in acute distress.    Appearance: Normal appearance.  HENT:     Head: Normocephalic and atraumatic.     Right Ear: External ear normal.     Left Ear: External ear normal.  Eyes:     General: No scleral icterus.       Right eye: No discharge.        Left eye: No discharge.     Conjunctiva/sclera: Conjunctivae normal.  Neck:     Thyroid: No thyromegaly.  Cardiovascular:     Rate and Rhythm: Normal rate and regular rhythm.  Pulmonary:     Effort: No respiratory distress.     Breath sounds: Normal breath sounds. No wheezing.     Comments: Breasts:  no nipple discharge or nipple retraction present.  Right breast - no nodules or axillary adenopathy present.  Left breast - increased firm area - mid breast with circular  erythema.  Abdominal:     General: Bowel sounds are normal.     Palpations: Abdomen is soft.     Tenderness: There is no abdominal tenderness.  Musculoskeletal:        General: No swelling or tenderness.     Cervical back: Neck supple. No tenderness.  Lymphadenopathy:     Cervical: No cervical adenopathy.  Skin:    Findings: No erythema or rash.  Neurological:  Mental Status: She is alert.  Psychiatric:        Mood and Affect: Mood normal.        Behavior: Behavior normal.      Outpatient Encounter Medications as of 05/28/2023  Medication Sig   albuterol (VENTOLIN HFA) 108 (90 Base) MCG/ACT inhaler Inhale 2 puffs into the lungs as needed.   buPROPion (WELLBUTRIN XL) 150 MG 24 hr tablet TAKE 1 TABLET BY MOUTH EVERY DAY   cetirizine (ZYRTEC) 10 MG tablet Take 1 tablet by mouth at bedtime.   mometasone (ELOCON) 0.1 % cream Apply 1 application topically as needed.   montelukast (SINGULAIR) 10 MG tablet Take 1 tablet by mouth at bedtime.   sodium chloride (OCEAN) 0.65 % SOLN nasal spray Place 1 spray into both nostrils as needed for congestion.   amoxicillin-clavulanate (AUGMENTIN) 875-125 MG tablet Take 1 tablet by mouth 2 (two) times daily for 5 days. (Patient not taking: Reported on 05/28/2023)   No facility-administered encounter medications on file as of 05/28/2023.     Lab Results  Component Value Date   WBC 11.0 (H) 05/28/2023   HGB 13.0 05/28/2023   HCT 39.5 05/28/2023   PLT 244 05/28/2023   GLUCOSE 81 05/28/2023   CHOL 157 05/28/2023   TRIG 227 (H) 05/28/2023   HDL 51 05/28/2023   LDLCALC 74 05/28/2023   ALT 12 05/28/2023   AST 10 05/28/2023   NA 138 05/28/2023   K 4.0 05/28/2023   CL 104 05/28/2023   CREATININE 0.65 05/28/2023   BUN 10 05/28/2023   CO2 24 05/28/2023   TSH 2.64 05/28/2023    MM BREAST SURGICAL SPECIMEN  Result Date: 01/27/2023 CLINICAL DATA:  Status post RF ID tag localized LEFT breast surgical excision. EXAM: SPECIMEN RADIOGRAPH OF THE LEFT  BREAST COMPARISON:  Previous exam(s). FINDINGS: Status post excision of the LEFT breast. The RF ID tag and X shaped clip are present within the specimen. IMPRESSION: Specimen radiograph of the LEFT breast. Electronically Signed   By: Meda Klinefelter M.D.   On: 01/27/2023 09:31      Assessment & Plan:  Routine general medical examination at a health care facility  Health care maintenance Assessment & Plan: Physical today 05/28/23.  04/23/22 - PAP.  Mammogram as outlined.    Screening cholesterol level -     Lipid panel  Weight gain -     CBC with Differential/Platelet -     TSH -     COMPLETE METABOLIC PANEL WITH eGFR  Breast wound, left, initial encounter Assessment & Plan: S/p bilateral breast reduction by Dr Ulice Bold 01/27/23.  Followed by surgery.  Left breast changes as outlined.  On augmentin now.  Helping.  Follow closely.  Continue augmentin.  Keep f/u next week.    Stress Assessment & Plan: Overall appears to be handling things relatively well.  Continue wellbutrin. Follow.    Weight loss counseling, encounter for Assessment & Plan: Discussed medication options.  Insurance - does not Art therapist. Discussed insulin resistance.  Discussed metformin and weight management referral.  Will notify me if desires any further intervention.  Low carb diet and exercise.  Follow.       Dale [place], MD

## 2023-05-29 ENCOUNTER — Encounter: Payer: Self-pay | Admitting: Internal Medicine

## 2023-05-29 LAB — CBC WITH DIFFERENTIAL/PLATELET
Absolute Monocytes: 902 cells/uL (ref 200–950)
Basophils Absolute: 55 cells/uL (ref 0–200)
Basophils Relative: 0.5 %
Eosinophils Absolute: 176 cells/uL (ref 15–500)
Eosinophils Relative: 1.6 %
HCT: 39.5 % (ref 35.0–45.0)
Hemoglobin: 13 g/dL (ref 11.7–15.5)
Lymphs Abs: 2508 cells/uL (ref 850–3900)
MCH: 29.6 pg (ref 27.0–33.0)
MCHC: 32.9 g/dL (ref 32.0–36.0)
MCV: 90 fL (ref 80.0–100.0)
MPV: 11.7 fL (ref 7.5–12.5)
Monocytes Relative: 8.2 %
Neutro Abs: 7359 cells/uL (ref 1500–7800)
Neutrophils Relative %: 66.9 %
Platelets: 244 10*3/uL (ref 140–400)
RBC: 4.39 10*6/uL (ref 3.80–5.10)
RDW: 13.4 % (ref 11.0–15.0)
Total Lymphocyte: 22.8 %
WBC: 11 10*3/uL — ABNORMAL HIGH (ref 3.8–10.8)

## 2023-05-29 LAB — LIPID PANEL
Cholesterol: 157 mg/dL (ref <200)
HDL: 51 mg/dL (ref >=50)
LDL Cholesterol (Calc): 74 mg/dL (calc)
Non-HDL Cholesterol (Calc): 106 mg/dL (calc) (ref <130)
Total CHOL/HDL Ratio: 3.1 (calc) (ref <5.0)
Triglycerides: 227 mg/dL — ABNORMAL HIGH (ref <150)

## 2023-05-29 LAB — COMPLETE METABOLIC PANEL WITH GFR
AG Ratio: 1.8 (calc) (ref 1.0–2.5)
ALT: 12 U/L (ref 6–29)
AST: 10 U/L (ref 10–30)
Albumin: 4.4 g/dL (ref 3.6–5.1)
Alkaline phosphatase (APISO): 75 U/L (ref 31–125)
BUN: 10 mg/dL (ref 7–25)
CO2: 24 mmol/L (ref 20–32)
Calcium: 9.7 mg/dL (ref 8.6–10.2)
Chloride: 104 mmol/L (ref 98–110)
Creat: 0.65 mg/dL (ref 0.50–0.99)
Globulin: 2.5 g/dL (calc) (ref 1.9–3.7)
Glucose, Bld: 81 mg/dL (ref 65–99)
Potassium: 4 mmol/L (ref 3.5–5.3)
Sodium: 138 mmol/L (ref 135–146)
Total Bilirubin: 0.3 mg/dL (ref 0.2–1.2)
Total Protein: 6.9 g/dL (ref 6.1–8.1)
eGFR: 113 mL/min/{1.73_m2} (ref >=60)

## 2023-05-29 LAB — TSH: TSH: 2.64 mIU/L

## 2023-05-29 NOTE — Assessment & Plan Note (Signed)
S/p bilateral breast reduction by Dr Ulice Bold 01/27/23.  Followed by surgery.  Left breast changes as outlined.  On augmentin now.  Helping.  Follow closely.  Continue augmentin.  Keep f/u next week.

## 2023-05-29 NOTE — Assessment & Plan Note (Signed)
Overall appears to be handling things relatively well.  Continue wellbutrin. Follow.  

## 2023-05-29 NOTE — Assessment & Plan Note (Signed)
Discussed medication options.  Insurance - does not Art therapist. Discussed insulin resistance.  Discussed metformin and weight management referral.  Will notify me if desires any further intervention.  Low carb diet and exercise.  Follow.

## 2023-06-01 ENCOUNTER — Telehealth: Payer: Self-pay

## 2023-06-01 DIAGNOSIS — D72829 Elevated white blood cell count, unspecified: Secondary | ICD-10-CM

## 2023-06-01 NOTE — Addendum Note (Signed)
Addended by: Rita Ohara D on: 06/01/2023 12:21 PM   Modules accepted: Orders

## 2023-06-01 NOTE — Telephone Encounter (Signed)
Pt returned [place] LPN call. Note below was read to pt about her results. Pt aware and understood. Pt its book for her CBC for 07/14/23

## 2023-06-01 NOTE — Telephone Encounter (Signed)
-----   Message from Dale [place], MD sent at 05/29/2023  4:22 PM EDT ----- Notify - white blood cell count - slightly elevated.  May be related to the breast lesion.  Currently on augmentin.  We will follow.  Hgb and platelet count wnl.  Triglycerides are increased.  Remainder of cholesterol - wnl.  Low carb diet.  Send copy of [facility] lipid diet.  We will follow.  Thyroid test, kidney function tests and liver function tests are wnl. Recheck cbc (dx leukocytosis) - in 6-8 weeks.

## 2023-06-01 NOTE — Telephone Encounter (Signed)
Noted  

## 2023-06-02 ENCOUNTER — Ambulatory Visit: Payer: BC Managed Care – PPO | Admitting: Surgical

## 2023-06-02 DIAGNOSIS — Z9889 Other specified postprocedural states: Secondary | ICD-10-CM

## 2023-06-02 DIAGNOSIS — S21009D Unspecified open wound of unspecified breast, subsequent encounter: Secondary | ICD-10-CM

## 2023-06-02 MED ORDER — AMOXICILLIN-POT CLAVULANATE 875-125 MG PO TABS: 1.0000 | ORAL_TABLET | Freq: Two times a day (BID) | ORAL | 0 refills | Status: AC

## 2023-06-02 NOTE — Progress Notes (Signed)
Patient is a very pleasant 42 year old female here for virtual telephone visit to discuss left breast fluid collection.  She has a history of bilateral breast reduction in conjunction with left breast biopsy with radiofrequency localization by Dr. Ulice Bold and Dr. Hazle Quant on 01/27/2023.  Postoperatively she had a wound to the left breast and underwent excision and primary closure on 04/14/2023.  Most recently she developed a fluid collection along the vertical limb and cultures were obtained, cultures grew rare diphtheroids and moderate Finegoldia magna.  Discussed cultures with pharmacy, it stated that this is typically contaminants from skin flora, however if antibiotic treatment was recommended, Augmentin would be a good antibiotic of choice.  Given that the patient developed surrounding erythema and the fluid collection recurred, patient was placed on Augmentin for 5 days.  She reports today that she has 1 more dose.  She is tolerating the antibiotics without any issues. She reports continued redness and reports that she still has a small fluid collection present.  She sent a picture via MyChart.  She reports she feels well and is not having any infectious symptoms.  She continues to have some firmness surrounding the breast.    The patient gave consent to have this visit done by telemedicine / virtual visit, two identifiers were used to identify patient. This is also consent for access the chart and treat the patient via this visit. The patient is located in [place].  I, the provider, am at the office.  We spent 5 minutes together for the visit.  Joined by telephone.  A/P:  Recommend continuing with warm compresses to the left breast, being sure to avoid any compresses that are too hot due to risk of potential numbness.  Recommend testing the warm compress on her arm prior to placing on her breast.  Will extend antibiotic regimen for 5 more days given the continued surrounding redness.  She is  planning a vacation to [facility] and will return to [place] around 22 July.  I would like her to follow-up in person on July 23 for reevaluation.  She knows to call with any questions or concerns or if her symptoms worsen or change.

## 2023-06-14 NOTE — Progress Notes (Unsigned)
Patient is a pleasant 42 year old female with PMH of macromastia s/p bilateral breast reduction at time of left-sided breast biopsy and radiofrequency localizer performed 01/27/2023 by Dr. Ulice Bold and Dr. Hazle Quant. 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 Ulice Bold who returns for post operative follow up.  She was last seen here in clinic on 06/02/2023.  At that time, she developed redness surrounding her wound site and cultures were reviewed.  She was placed on Augmentin for 5 days.  On exam, there was persistent fluid collection.  Recommended continued warm compresses and will antibiotics.  Today,

## 2023-06-15 ENCOUNTER — Ambulatory Visit: Payer: BC Managed Care – PPO | Admitting: Physician Assistant

## 2023-06-15 DIAGNOSIS — Z9889 Other specified postprocedural states: Secondary | ICD-10-CM

## 2023-06-29 ENCOUNTER — Ambulatory Visit: Payer: BC Managed Care – PPO | Admitting: Physician Assistant

## 2023-06-29 DIAGNOSIS — Z9889 Other specified postprocedural states: Secondary | ICD-10-CM

## 2023-06-29 DIAGNOSIS — S21009D Unspecified open wound of unspecified breast, subsequent encounter: Secondary | ICD-10-CM

## 2023-06-29 NOTE — Progress Notes (Signed)
Patient is a pleasant 42 year old female with PMH of macromastia s/p bilateral breast reduction at time of left-sided breast biopsy and radiofrequency localizer performed 01/27/2023 by Dr. Ulice Bold and Dr. Hazle Quant. 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 Ulice Bold who returns for post operative follow up.   She was last seen here in clinic on 06/15/2023.  At that time, she continued to have fluid-filled blisters that would spontaneously drain at the site of her left vertical limb wound.  On exam, palpable mass approximately 4 x 6 cm that is discrete and mildly tender to palpation appreciated directly behind the wound.  No surrounding skin changes.  Suspect possible area of fat necrosis periodically liquefying causing the drainage and persistent defect.  Discussed case with Dr. Ulice Bold recommended excision in the operating room for definitive management.  Patient was agreeable to proceeding.  Discussed case with our surgical scheduling team today who reports that they are obtaining authorization.  She has not yet been scheduled.    Today she is doing well.  She states that she has not had any drainage or subsequent fluid-filled blisters since last encounter.  She feels as though it is actually improving.  She inquires about whether or not we need to proceed with surgical excision versus continued conservative management.  She has been massaging the area, unsure if it is getting any smaller.  She states that it is tender to palpate, but denies any constant soreness/pain.  Denies any fevers or other systemic symptoms.  On exam, the area of firmness just behind the vertical limb wound still measures approximately 4.5 x 5 cm in size.  No significant change since last encounter.  However, the surrounding skin tissue appears quite healthy and there is only a small, pinpoint wound less than 0.5 cm.   Nondraining, even with palpation.  See images.  At this point, can certainly proceed with watchful waiting.  Will still plan for obtaining insurance authorization in the event that something changes and she would like to move forward with excision, but we will plan for her to return in 1 month for follow-up and see if there is any improvement with continued mechanical massage.  She understands that massaging the area could take upwards of 12 to 18 months before complete resolution.  Picture(s) obtained of the patient and placed in the chart were with the patient's or guardian's permission.

## 2023-07-14 ENCOUNTER — Other Ambulatory Visit (INDEPENDENT_AMBULATORY_CARE_PROVIDER_SITE_OTHER): Payer: BC Managed Care – PPO

## 2023-07-14 DIAGNOSIS — D72829 Elevated white blood cell count, unspecified: Secondary | ICD-10-CM

## 2023-07-14 LAB — CBC WITH DIFFERENTIAL/PLATELET
Basophils Absolute: 0 10*3/uL (ref 0.0–0.1)
Basophils Relative: 0.5 % (ref 0.0–3.0)
Eosinophils Absolute: 0.1 10*3/uL (ref 0.0–0.7)
Eosinophils Relative: 1.4 % (ref 0.0–5.0)
HCT: 38.3 % (ref 36.0–46.0)
Hemoglobin: 12.8 g/dL (ref 12.0–15.0)
Lymphocytes Relative: 23.8 % (ref 12.0–46.0)
Lymphs Abs: 2.4 10*3/uL (ref 0.7–4.0)
MCHC: 33.4 g/dL (ref 30.0–36.0)
MCV: 90.6 fl (ref 78.0–100.0)
Monocytes Absolute: 0.7 10*3/uL (ref 0.1–1.0)
Monocytes Relative: 6.6 % (ref 3.0–12.0)
Neutro Abs: 6.9 10*3/uL (ref 1.4–7.7)
Neutrophils Relative %: 67.7 % (ref 43.0–77.0)
Platelets: 249 10*3/uL (ref 150.0–400.0)
RBC: 4.23 Mil/uL (ref 3.87–5.11)
RDW: 14.1 % (ref 11.5–15.5)
WBC: 10.1 10*3/uL (ref 4.0–10.5)

## 2023-07-29 NOTE — Progress Notes (Signed)
Referring Provider Dale Salem, MD [address] [place],  [place] [address]   CC:  Chief Complaint  Patient presents with   Follow-up      Tara Davila is an 42 y.o. female.  HPI: Patient is a pleasant 42 year old female with PMH of macromastia s/p bilateral breast reduction at time of left-sided breast biopsy and radiofrequency localizer performed 01/27/2023 by Dr. Ulice Bold and Dr. Hazle Quant. 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 Ulice Bold who returns for post operative follow up.   She was last seen here in clinic on 06/29/2023.  At that time, she was doing well and felt as though she had not had any drainage or subsequent fluid collections.  She had continued mechanical massage of the area, but no significant change.  On exam, area of firmness measured approximately 4.5 x 5 cm in size.  Nondraining, pinpoint wound less than 0.5 cm.  Discussed watchful waiting and obtaining insurance authorization in the event that she wants to move forward with excision.  Otherwise, follow-up in 1 month to assess for possible improvement.  She understands that massaging the area could take upwards of 12 to 18 months before complete resolution.  Today, she is doing okay.  She states that it has not had much change since last encounter.  While she would prefer to avoid surgery, she does have some concerns about not having it addressed in the operating room.  She has a family history of breast cancer including her cousin who is just recently diagnosed.  She herself has had multiple biopsies in the past.  She is concerned that an area of fat necrosis may obscure potential malignancy.  She also states that recently it has been a bit sore, but denies any redness, drainage, fevers, or other symptoms.  She has continued with mechanical massage, as discussed.  She has not had to wear  gauze or bandages for drainage.   Allergies  Allergen Reactions   Wound Dressing Adhesive Itching, Dermatitis and Rash    Dermabond (skin glue)    Outpatient Encounter Medications as of 08/02/2023  Medication Sig   albuterol (VENTOLIN HFA) 108 (90 Base) MCG/ACT inhaler Inhale 2 puffs into the lungs as needed.   buPROPion (WELLBUTRIN XL) 150 MG 24 hr tablet TAKE 1 TABLET BY MOUTH EVERY DAY   cetirizine (ZYRTEC) 10 MG tablet Take 1 tablet by mouth at bedtime.   mometasone (ELOCON) 0.1 % cream Apply 1 application topically as needed.   montelukast (SINGULAIR) 10 MG tablet Take 1 tablet by mouth at bedtime.   sodium chloride (OCEAN) 0.65 % SOLN nasal spray Place 1 spray into both nostrils as needed for congestion.   No facility-administered encounter medications on file as of 08/02/2023.     Past Medical History:  Diagnosis Date   Allergy    Anemia    Asthma    well controlled   GERD (gastroesophageal reflux disease)    occ   History of chicken pox    Hx: UTI (urinary tract infection)     Past Surgical History:  Procedure Laterality Date   BREAST BIOPSY Left 12/24/2021   left breast stereo x clip neg   BREAST BIOPSY WITH RADIO FREQUENCY LOCALIZER Left 01/27/2023   Procedure: BREAST BIOPSY WITH RADIO FREQUENCY LOCALIZER;  Surgeon: Carolan Shiver, MD;  Location: [facility];  Service: General;  Laterality: Left;  BREAST REDUCTION SURGERY Bilateral 01/27/2023   Procedure: MAMMARY REDUCTION  (BREAST);  Surgeon: Peggye Form, DO;  Location: [facility];  Service: Plastics;  Laterality: Bilateral;   WISDOM TOOTH EXTRACTION      Family History  Problem Relation Age of Onset   Hyperlipidemia Mother    Hypertension Father    Cancer Maternal Grandmother        breast   Breast cancer Maternal Grandmother 57   Hyperlipidemia Maternal Grandfather    Heart disease Maternal Grandfather    Hypertension Maternal Grandfather    Stroke Paternal Grandmother    Breast cancer Paternal  Aunt     Social History   Social History Narrative   Lives at home     Review of Systems General: Denies fevers or chills Skin: Denies any redness, drainage, or malodor  Physical Exam    08/02/2023   12:47 PM 05/28/2023    1:56 PM 05/25/2023    1:30 PM  Vitals with BMI  Height  5\' 3"    Weight  186 lbs 6 oz   BMI  33.03   Systolic 131 114 161  Diastolic 89 72 85  Pulse 98 90 114    General:  No acute distress, nontoxic appearing  Respiratory: No increased work of breathing Neuro: Alert and oriented Psychiatric: Normal mood and affect  Left breast: 4 x 5 cm discrete mass, mildly tender to palpation.  No overlying skin changes.   Assessment/Plan  S/p bilateral breast reduction 01/2023 complicated by fat necrosis inferior aspect left breast:  No significant change since last encounter.  The discrete mass is still approximately 4 x 5 cm, relatively unchanged in size.  Her history and exam it remains consistent with fat necrosis and do not feel as though imaging or other diagnostic workup is indicated.    Patient understands that while surgery is not emergent or urgent, we would be happy to take her back for excision of fat necrosis and scar revision.  Otherwise, she understands that it can take another year before her area of firmness softens.  At that time, she may continue to have a defect in that area that appears scarred down compared to contralateral side.    She still would like a little bit more time to think about it.  Will have her follow-up with Dr. Ulice Bold specifically to discuss all of her surgical options.  Will also message our surgical scheduling team to ensure that authorization has been obtained.  Follow-up in 3 to 4 weeks.  Picture(s) obtained of the patient and placed in the chart were with the patient's or guardian's permission.   Evelena Leyden PA-C 08/02/2023, 1:24 PM

## 2023-08-02 ENCOUNTER — Encounter: Payer: Self-pay | Admitting: Physician Assistant

## 2023-08-02 ENCOUNTER — Ambulatory Visit (INDEPENDENT_AMBULATORY_CARE_PROVIDER_SITE_OTHER): Payer: BC Managed Care – PPO | Admitting: Physician Assistant

## 2023-08-02 VITALS — BP 131/89 | HR 98

## 2023-08-02 DIAGNOSIS — Z9889 Other specified postprocedural states: Secondary | ICD-10-CM

## 2023-08-02 DIAGNOSIS — M7989 Other specified soft tissue disorders: Secondary | ICD-10-CM

## 2023-08-27 ENCOUNTER — Ambulatory Visit: Payer: BC Managed Care – PPO | Admitting: Internal Medicine

## 2023-09-02 ENCOUNTER — Ambulatory Visit: Payer: BC Managed Care – PPO | Admitting: Surgical

## 2023-09-03 ENCOUNTER — Ambulatory Visit: Payer: BC Managed Care – PPO | Admitting: Plastic Surgery

## 2023-09-10 ENCOUNTER — Ambulatory Visit: Payer: BC Managed Care – PPO | Admitting: Internal Medicine

## 2023-09-10 VITALS — BP 122/70 | HR 90 | Temp 98.0°F | Resp 16 | Ht 65.0 in | Wt 184.6 lb

## 2023-09-10 DIAGNOSIS — N641 Fat necrosis of breast: Secondary | ICD-10-CM

## 2023-09-10 DIAGNOSIS — E7800 Pure hypercholesterolemia, unspecified: Secondary | ICD-10-CM

## 2023-09-10 DIAGNOSIS — F439 Reaction to severe stress, unspecified: Secondary | ICD-10-CM | POA: Diagnosis not present

## 2023-09-10 DIAGNOSIS — Z23 Encounter for immunization: Secondary | ICD-10-CM

## 2023-09-10 MED ORDER — BUSPIRONE HCL 5 MG PO TABS: 5.0000 mg | ORAL_TABLET | Freq: Every day | ORAL | 1 refills | Status: DC

## 2023-09-10 NOTE — Progress Notes (Signed)
Subjective:    Patient ID: Tara Davila, female    DOB: [birth year], 42 y.o.   MRN: [id number]  Patient here for  Chief Complaint  Patient presents with   Medical Management of Chronic Issues    HPI Here for a scheduled follow up.  Is s/p bilateral breast reduction in conjunction with left breast biopsy with radiofrequency localization by Dr. Ulice Bold with Dr. Hazle Quant on 01/27/2023. Postoperatively she developed a wound to the left breast and underwent excision and primary closure on 04/14/2023. She continues to be followed by surgery - fat necrosis inferior aspect left breast. She has discussed excision of fat necrosis and scar revision with surgery. Discussed today.  She is concerned that the fat necrosis will obscure and limit visualization of breast tissue - for adequate mammogram review.  She has discussed this with surgery.  They discussed that it may take a year before the area of firmness softens.  Increased stress related to above and work, etc.  Discussed increased stress and anxiety.  She feels she needs something more to help level things out.  No chest pain reported.  Breathing stable.  No abdominal pain or bowel change reported.    Past Medical History:  Diagnosis Date   Allergy    Anemia    Asthma    well controlled   GERD (gastroesophageal reflux disease)    occ   History of chicken pox    Hx: UTI (urinary tract infection)    Past Surgical History:  Procedure Laterality Date   BREAST BIOPSY Left 12/24/2021   left breast stereo x clip neg   BREAST BIOPSY WITH RADIO FREQUENCY LOCALIZER Left 01/27/2023   Procedure: BREAST BIOPSY WITH RADIO FREQUENCY LOCALIZER;  Surgeon: Carolan Shiver, MD;  Location: [facility];  Service: General;  Laterality: Left;   BREAST REDUCTION SURGERY Bilateral 01/27/2023   Procedure: MAMMARY REDUCTION  (BREAST);  Surgeon: Peggye Form, DO;  Location: [facility];  Service: Plastics;  Laterality: Bilateral;   WISDOM TOOTH  EXTRACTION     Family History  Problem Relation Age of Onset   Hyperlipidemia Mother    Hypertension Father    Cancer Maternal Grandmother        breast   Breast cancer Maternal Grandmother 55   Hyperlipidemia Maternal Grandfather    Heart disease Maternal Grandfather    Hypertension Maternal Grandfather    Stroke Paternal Grandmother    Breast cancer Paternal Aunt    Social History   Socioeconomic History   Marital status: Married    Spouse name: Not on file   Number of children: Not on file   Years of education: Not on file   Highest education level: Bachelor's degree (e.g., BA, AB, BS)  Occupational History   Not on file  Tobacco Use   Smoking status: Never   Smokeless tobacco: Never  Vaping Use   Vaping status: Never Used  Substance and Sexual Activity   Alcohol use: Yes    Comment: rare   Drug use: No   Sexual activity: Not on file  Other Topics Concern   Not on file  Social History Narrative   Lives at home   Social Determinants of Health   Financial Resource Strain: Low Risk  (02/24/2023)   Overall Financial Resource Strain (CARDIA)    Difficulty of Paying Living Expenses: Not hard at all  Food Insecurity: No Food Insecurity (02/24/2023)   Hunger Vital Sign    Worried About Programme researcher, broadcasting/film/video in  the Last Year: Never true    Ran Out of Food in the Last Year: Never true  Transportation Needs: No Transportation Needs (02/24/2023)   PRAPARE - Administrator, Civil Service (Medical): No    Lack of Transportation (Non-Medical): No  Physical Activity: Insufficiently Active (02/24/2023)   Exercise Vital Sign    Days of Exercise per Week: 3 days    Minutes of Exercise per Session: 30 min  Stress: Stress Concern Present (02/24/2023)   [employer] of Occupational Health - Occupational Stress Questionnaire    Feeling of Stress : Very much  Social Connections: Socially Integrated (02/24/2023)   Social Connection and Isolation Panel [NHANES]    Frequency  of Communication with Friends and Family: More than three times a week    Frequency of Social Gatherings with Friends and Family: Once a week    Attends Religious Services: More than 4 times per year    Active Member of [employer] or Organizations: Yes    Attends Engineer, structural: More than 4 times per year    Marital Status: Married     Review of Systems  Constitutional:  Negative for appetite change and unexpected weight change.  HENT:  Negative for congestion and sinus pressure.   Respiratory:  Negative for cough, chest tightness and shortness of breath.   Cardiovascular:  Negative for chest pain and palpitations.  Gastrointestinal:  Negative for abdominal pain, diarrhea, nausea and vomiting.  Genitourinary:  Negative for difficulty urinating and dysuria.  Musculoskeletal:  Negative for joint swelling and myalgias.  Skin:  Negative for color change and rash.  Neurological:  Negative for dizziness and headaches.  Psychiatric/Behavioral:  Negative for agitation and dysphoric mood.        Increased stress and anxiety as outlined.        Objective:     BP 122/70   Pulse 90   Temp 98 F (36.7 C)   Resp 16   Ht 5\' 5"  (1.651 m)   Wt 184 lb 9.6 oz (83.7 kg)   SpO2 98%   BMI 30.72 kg/m  Wt Readings from Last 3 Encounters:  09/10/23 184 lb 9.6 oz (83.7 kg)  05/28/23 186 lb 6.4 oz (84.6 kg)  05/14/23 177 lb (80.3 kg)    Physical Exam Vitals reviewed.  Constitutional:      General: She is not in acute distress.    Appearance: Normal appearance.  HENT:     Head: Normocephalic and atraumatic.     Right Ear: External ear normal.     Left Ear: External ear normal.  Eyes:     General: No scleral icterus.       Right eye: No discharge.        Left eye: No discharge.     Conjunctiva/sclera: Conjunctivae normal.  Neck:     Thyroid: No thyromegaly.  Cardiovascular:     Rate and Rhythm: Normal rate and regular rhythm.  Pulmonary:     Effort: No respiratory distress.      Breath sounds: Normal breath sounds. No wheezing.     Comments: Breast Exam:  left breast - palpable firm area - inferior aspect of left breast.  Minimal tenderness.  No increased erythema.  Abdominal:     General: Bowel sounds are normal.     Palpations: Abdomen is soft.     Tenderness: There is no abdominal tenderness.  Musculoskeletal:        General: No swelling or tenderness.  Cervical back: Neck supple. No tenderness.  Lymphadenopathy:     Cervical: No cervical adenopathy.  Skin:    Findings: No erythema or rash.  Neurological:     Mental Status: She is alert.  Psychiatric:        Mood and Affect: Mood normal.        Behavior: Behavior normal.      Outpatient Encounter Medications as of 09/10/2023  Medication Sig   albuterol (VENTOLIN HFA) 108 (90 Base) MCG/ACT inhaler Inhale 2 puffs into the lungs as needed.   buPROPion (WELLBUTRIN XL) 150 MG 24 hr tablet TAKE 1 TABLET BY MOUTH EVERY DAY   busPIRone (BUSPAR) 5 MG tablet Take 1 tablet (5 mg total) by mouth daily.   cetirizine (ZYRTEC) 10 MG tablet Take 1 tablet by mouth at bedtime.   mometasone (ELOCON) 0.1 % cream Apply 1 application topically as needed.   montelukast (SINGULAIR) 10 MG tablet Take 1 tablet by mouth at bedtime.   sodium chloride (OCEAN) 0.65 % SOLN nasal spray Place 1 spray into both nostrils as needed for congestion.   No facility-administered encounter medications on file as of 09/10/2023.     Lab Results  Component Value Date   WBC 10.1 07/14/2023   HGB 12.8 07/14/2023   HCT 38.3 07/14/2023   PLT 249.0 07/14/2023   GLUCOSE 81 05/28/2023   CHOL 157 05/28/2023   TRIG 227 (H) 05/28/2023   HDL 51 05/28/2023   LDLCALC 74 05/28/2023   ALT 12 05/28/2023   AST 10 05/28/2023   NA 138 05/28/2023   K 4.0 05/28/2023   CL 104 05/28/2023   CREATININE 0.65 05/28/2023   BUN 10 05/28/2023   CO2 24 05/28/2023   TSH 2.64 05/28/2023       Assessment & Plan:  Need for influenza vaccination -      Flu vaccine trivalent PF, 6mos and older(Flulaval,Afluria,Fluarix,Fluzone)  Hypercholesterolemia -     Comprehensive metabolic panel; Future -     Lipid panel; Future  Stress Assessment & Plan: Increased stress and anxiety as outlined.  Discussed.  Continues on wellbutrin.  Feels needs something more to help level things out.  Discussed trial of buspar.  Will start buspar 5mg  q day.  Follow closely.  Call with update.    Fat necrosis of breast Assessment & Plan: Is s/p bilateral breast reduction in conjunction with left breast biopsy with radiofrequency localization by Dr. Ulice Bold with Dr. Hazle Quant on 01/27/2023. Postoperatively she developed a wound to the left breast and underwent excision and primary closure on 04/14/2023. She continues to be followed by surgery - fat necrosis inferior aspect left breast. She has discussed excision of fat necrosis and scar revision with surgery. Discussed today.  She is concerned that the fat necrosis will obscure and limit visualization of breast tissue - for adequate mammogram review.  She has discussed this with surgery.  They discussed that it may take a year before the area of firmness softens. Continues to follow up with surgery.    Other orders -     busPIRone HCl; Take 1 tablet (5 mg total) by mouth daily.  Dispense: 30 tablet; Refill: 1   I spent 25 minutes with the patient. Time spent dicussed her increased stress and anxiety and persistent fat necrosis and treatment options.     Dale Atkinson, MD

## 2023-09-10 NOTE — Telephone Encounter (Signed)
Can you please review and touch base with patient? I believe you already have it authorized?

## 2023-09-12 ENCOUNTER — Encounter: Payer: Self-pay | Admitting: Internal Medicine

## 2023-09-12 NOTE — Assessment & Plan Note (Signed)
Is s/p bilateral breast reduction in conjunction with left breast biopsy with radiofrequency localization by Dr. Ulice Bold with Dr. Hazle Quant on 01/27/2023. Postoperatively she developed a wound to the left breast and underwent excision and primary closure on 04/14/2023. She continues to be followed by surgery - fat necrosis inferior aspect left breast. She has discussed excision of fat necrosis and scar revision with surgery. Discussed today.  She is concerned that the fat necrosis will obscure and limit visualization of breast tissue - for adequate mammogram review.  She has discussed this with surgery.  They discussed that it may take a year before the area of firmness softens. Continues to follow up with surgery.

## 2023-09-12 NOTE — Assessment & Plan Note (Signed)
Increased stress and anxiety as outlined.  Discussed.  Continues on wellbutrin.  Feels needs something more to help level things out.  Discussed trial of buspar.  Will start buspar 5mg  q day.  Follow closely.  Call with update.

## 2023-09-21 ENCOUNTER — Encounter: Payer: Self-pay | Admitting: Plastic Surgery

## 2023-09-21 ENCOUNTER — Ambulatory Visit (INDEPENDENT_AMBULATORY_CARE_PROVIDER_SITE_OTHER): Payer: BC Managed Care – PPO | Admitting: Plastic Surgery

## 2023-09-21 VITALS — BP 135/87 | HR 99 | Ht 65.0 in | Wt 184.0 lb

## 2023-09-21 DIAGNOSIS — N651 Disproportion of reconstructed breast: Secondary | ICD-10-CM | POA: Diagnosis not present

## 2023-09-21 DIAGNOSIS — N6489 Other specified disorders of breast: Secondary | ICD-10-CM | POA: Insufficient documentation

## 2023-09-21 DIAGNOSIS — M7989 Other specified soft tissue disorders: Secondary | ICD-10-CM | POA: Diagnosis not present

## 2023-09-21 DIAGNOSIS — N641 Fat necrosis of breast: Secondary | ICD-10-CM | POA: Diagnosis not present

## 2023-09-21 DIAGNOSIS — N63 Unspecified lump in unspecified breast: Secondary | ICD-10-CM

## 2023-09-21 NOTE — Progress Notes (Signed)
   Subjective:    Patient ID: Tara Davila, female    DOB: Sep 18, 1981, 42 y.o.   MRN: [id number]  The patient is a 42 year old female here for dilation of her breasts.  She had extremely large breasts and underwent a breast reduction March 2024.  She had over 500 g removed from both breasts.  She also had a fibroid removed at the same time from the left breast.  Since then she has had challenges with fat necrosis of the left side.  She had a persistent area of drainage from the left breast at the vertical limb.  I excised some fat necrosis in the office and she went from not healing to finally healing over several months.  The area has opened up again and there is some very firm hard tissue underneath that I think is related to the fat necrosis.  This also could be another fibroma.  Has created some breast asymmetry.      Review of Systems  Constitutional: Negative.   HENT: Negative.    Eyes: Negative.   Respiratory: Negative.    Cardiovascular: Negative.   Gastrointestinal: Negative.   Endocrine: Negative.   Genitourinary: Negative.   Musculoskeletal: Negative.        Objective:   Physical Exam Constitutional:      Appearance: Normal appearance.  HENT:     Head: Atraumatic.  Cardiovascular:     Rate and Rhythm: Normal rate.     Pulses: Normal pulses.  Pulmonary:     Effort: Pulmonary effort is normal.  Musculoskeletal:        General: No swelling or deformity.  Skin:    General: Skin is warm.     Capillary Refill: Capillary refill takes less than 2 seconds.  Neurological:     Mental Status: She is alert and oriented to person, place, and time.  Psychiatric:        Mood and Affect: Mood normal.        Behavior: Behavior normal.        Thought Content: Thought content normal.        Assessment & Plan:     ICD-10-CM   1. Fat necrosis (segmental) of breast  N64.1     2. Breast nodule  N63.0     3. Soft tissue mass  M79.89     4. Breast asymmetry in female   N64.89        Recommend excision of fat necrosis with possible fat grafting to left breast for improved symmetry and revision reduction to the right breast for symmetry.

## 2023-09-30 ENCOUNTER — Encounter: Payer: Self-pay | Admitting: Student

## 2023-09-30 ENCOUNTER — Ambulatory Visit: Payer: BC Managed Care – PPO | Admitting: Student

## 2023-09-30 ENCOUNTER — Encounter: Payer: BC Managed Care – PPO | Admitting: Student

## 2023-09-30 VITALS — BP 134/77 | HR 97 | Ht 65.0 in | Wt 186.6 lb

## 2023-09-30 DIAGNOSIS — Z9889 Other specified postprocedural states: Secondary | ICD-10-CM

## 2023-09-30 MED ORDER — OXYCODONE HCL 5 MG PO TABS: 5.0000 mg | ORAL_TABLET | Freq: Four times a day (QID) | ORAL | 0 refills | Status: DC | PRN

## 2023-09-30 MED ORDER — CEPHALEXIN 500 MG PO CAPS: 500.0000 mg | ORAL_CAPSULE | Freq: Four times a day (QID) | ORAL | 0 refills | Status: AC

## 2023-09-30 NOTE — Progress Notes (Signed)
Patient ID: Tara Davila, female    DOB: [birth year], 42 y.o.   MRN: [id number]  Chief Complaint  Patient presents with   Pre-op Exam      ICD-10-CM   1. S/P bilateral breast reduction  Z98.890        History of Present Illness: Tara Davila is a 42 y.o.  female  with a history of macromastia status post breast reduction.  She presents for preoperative evaluation for upcoming procedure, revision of right breast reduction for symmetry, excision of left breast lesion and fat necrosis with fat grafting, scheduled for 10/18/2023 with Dr. Ulice Bold.  The patient has not had problems with anesthesia.  Patient denies any history of breast cancer.  She states her biopsy was negative when it was done at the time of her breast reduction.  She does report history of breast cancer in her grandmother on her maternal side and then her paternal aunt and cousin.  She denies any personal history of breast cancer.  She denies any history of cardiac disease.  She denies taking any blood thinners.  She reports she is not a smoker.  Patient reports she currently has Mirena IUD in place.  She denies taking any other hormone replacement.  She does report history of 1 miscarriage.  She denies any personal family history of blood clots or clotting diseases.  She denies any recent surgeries, traumas, infections.  She denies any history of stroke or heart attack.  She denies any history of Crohn's disease or ulcerative colitis.  She denies any history of COPD.  She states she has asthma, but it is controlled.  She denies any history of cancer.  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  She denies any fevers or chills.  Patient does state that she had a reaction to all of the adhesives from her last surgery, including Mepilex border dressings, Dermabond, Steri-Strips.  She states that she even has had a reaction to paper tape.  Summary of Previous Visit: Patient was most recently  seen by Dr. Ulice Bold on 09/21/2023.  At this visit, patient had extremely large breasts and underwent a breast reduction in 2024.  She had over 500 g removed from each breast.  She also had a fibroid removed at the same time from the left breast.  Since then, patient had challenges with fat necrosis on the left side.  Some of the fat necrosis was excised in the office and the area eventually healed.  The area then opened up again and there was some very firm hard tissue underneath that was consistent with fat necrosis.  There was some also breast asymmetry noted.  It was recommended to the patient that she has the fat necrosis excised with possible fat grafting to the left breast for improved symmetry as well as revision reduction to the right breast for symmetry.  Job: Works from home in [employer], planning to take 1 week off  PMH Significant for: IBS, fat necrosis of the breast, breast asymmetry   Past Medical History: Allergies: Allergies  Allergen Reactions   Wound Dressing Adhesive Itching, Dermatitis and Rash    Dermabond (skin glue)    Current Medications:  Current Outpatient Medications:    albuterol (VENTOLIN HFA) 108 (90 Base) MCG/ACT inhaler, Inhale 2 puffs into the lungs as needed., Disp: , Rfl:    buPROPion (WELLBUTRIN XL) 150 MG 24 hr tablet, TAKE 1 TABLET BY MOUTH EVERY DAY, Disp: 90 tablet, Rfl:  1   busPIRone (BUSPAR) 5 MG tablet, Take 1 tablet (5 mg total) by mouth daily., Disp: 30 tablet, Rfl: 1   cetirizine (ZYRTEC) 10 MG tablet, Take 1 tablet by mouth at bedtime., Disp: , Rfl:    mometasone (ELOCON) 0.1 % cream, Apply 1 application topically as needed., Disp: , Rfl:    montelukast (SINGULAIR) 10 MG tablet, Take 1 tablet by mouth at bedtime., Disp: , Rfl:    sodium chloride (OCEAN) 0.65 % SOLN nasal spray, Place 1 spray into both nostrils as needed for congestion., Disp: , Rfl:   Past Medical Problems: Past Medical History:  Diagnosis Date   Allergy    Anemia    Asthma     well controlled   GERD (gastroesophageal reflux disease)    occ   History of chicken pox    Hx: UTI (urinary tract infection)     Past Surgical History: Past Surgical History:  Procedure Laterality Date   BREAST BIOPSY Left 12/24/2021   left breast stereo x clip neg   BREAST BIOPSY WITH RADIO FREQUENCY LOCALIZER Left 01/27/2023   Procedure: BREAST BIOPSY WITH RADIO FREQUENCY LOCALIZER;  Surgeon: Carolan Shiver, MD;  Location: [facility];  Service: General;  Laterality: Left;   BREAST REDUCTION SURGERY Bilateral 01/27/2023   Procedure: MAMMARY REDUCTION  (BREAST);  Surgeon: Peggye Form, DO;  Location: [facility];  Service: Plastics;  Laterality: Bilateral;   WISDOM TOOTH EXTRACTION      Social History: Social History   Socioeconomic History   Marital status: Married    Spouse name: Not on file   Number of children: Not on file   Years of education: Not on file   Highest education level: Bachelor's degree (e.g., BA, AB, BS)  Occupational History   Not on file  Tobacco Use   Smoking status: Never   Smokeless tobacco: Never  Vaping Use   Vaping status: Never Used  Substance and Sexual Activity   Alcohol use: Yes    Comment: rare   Drug use: No   Sexual activity: Not on file  Other Topics Concern   Not on file  Social History Narrative   Lives at home   Social Determinants of Health   Financial Resource Strain: Low Risk  (02/24/2023)   Overall Financial Resource Strain (CARDIA)    Difficulty of Paying Living Expenses: Not hard at all  Food Insecurity: No Food Insecurity (02/24/2023)   Hunger Vital Sign    Worried About Running Out of Food in the Last Year: Never true    Ran Out of Food in the Last Year: Never true  Transportation Needs: No Transportation Needs (02/24/2023)   PRAPARE - Administrator, Civil Service (Medical): No    Lack of Transportation (Non-Medical): No  Physical Activity: Insufficiently Active (02/24/2023)   Exercise Vital Sign     Days of Exercise per Week: 3 days    Minutes of Exercise per Session: 30 min  Stress: Stress Concern Present (02/24/2023)   [employer] of Occupational Health - Occupational Stress Questionnaire    Feeling of Stress : Very much  Social Connections: Socially Integrated (02/24/2023)   Social Connection and Isolation Panel [NHANES]    Frequency of Communication with Friends and Family: More than three times a week    Frequency of Social Gatherings with Friends and Family: Once a week    Attends Religious Services: More than 4 times per year    Active Member of [employer] or Organizations:  Yes    Attends Banker Meetings: More than 4 times per year    Marital Status: Married  Catering manager Violence: Not on file    Family History: Family History  Problem Relation Age of Onset   Hyperlipidemia Mother    Hypertension Father    Cancer Maternal Grandmother        breast   Breast cancer Maternal Grandmother 50   Hyperlipidemia Maternal Grandfather    Heart disease Maternal Grandfather    Hypertension Maternal Grandfather    Stroke Paternal Grandmother    Breast cancer Paternal Aunt     Review of Systems: Denies fevers or chills  Physical Exam: Vital Signs BP 134/77 (BP Location: Left Arm, Patient Position: Sitting, Cuff Size: Normal)   Pulse 97   Ht 5\' 5"  (1.651 m)   Wt 186 lb 9.6 oz (84.6 kg)   SpO2 98%   BMI 31.05 kg/m   Physical Exam  Constitutional:      General: Not in acute distress.    Appearance: Normal appearance. Not ill-appearing.  HENT:     Head: Normocephalic and atraumatic.  Neck:     Musculoskeletal: Normal range of motion.  Cardiovascular:     Rate and Rhythm: Normal rate Pulmonary:     Effort: Pulmonary effort is normal. No respiratory distress.  Musculoskeletal: Normal range of motion.  Skin:    General: Skin is warm and dry.     Findings: No erythema or rash.  Neurological:     Mental Status: Alert and oriented to person, place, and  time. Mental status is at baseline.  Psychiatric:        Mood and Affect: Mood normal.        Behavior: Behavior normal.    Assessment/Plan: The patient is scheduled for excision of left breast fat necrosis/lesion with fat grafting, revision of right breast reduction with Dr. Ulice Bold.  Risks, benefits, and alternatives of procedure discussed, questions answered and consent obtained.    Smoking Status: Non-smoker; Counseling Given?  N/A Last Mammogram: 12/15/2022; Results: BI-RADS Category 2 benign  Caprini Score: 4; Risk Factors include: Age, BMI > 25, and length of planned surgery. Recommendation for mechanical prophylaxis. Encourage early ambulation.   Pictures obtained: 08/02/2023  Post-op Rx sent to pharmacy:  Oxycodone, Keflex, patient states she has Zofran at home  Instructed patient to hold any multivitamins or supplements at least 1 week prior to surgery.  Patient expressed understanding  Patient was provided with the General Surgical Risk consent document and Pain Medication Agreement prior to their appointment.  They had adequate time to read through the risk consent documents and Pain Medication Agreement. We also discussed them in person together during this preop appointment. All of their questions were answered to their satisfaction.  Recommended calling if they have any further questions.  Risk consent form and Pain Medication Agreement to be scanned into patient's chart.  The consent was obtained with risks and complications reviewed which included bleeding, pain, scar, infection and the risk of anesthesia.  The patients questions were answered to the patients expressed satisfaction.    Electronically signed by: Laurena Spies, PA-C 09/30/2023 2:28 PM

## 2023-09-30 NOTE — H&P (View-Only) (Signed)
Patient ID: Tara Davila, female    DOB: 1981/07/08, 42 y.o.   MRN: [id number]  Chief Complaint  Patient presents with   Pre-op Exam      ICD-10-CM   1. S/P bilateral breast reduction  Z98.890        History of Present Illness: Tara Davila is a 42 y.o.  female  with a history of macromastia status post breast reduction.  She presents for preoperative evaluation for upcoming procedure, revision of right breast reduction for symmetry, excision of left breast lesion and fat necrosis with fat grafting, scheduled for 10/18/2023 with Dr. Ulice Bold.  The patient has not had problems with anesthesia.  Patient denies any history of breast cancer.  She states her biopsy was negative when it was done at the time of her breast reduction.  She does report history of breast cancer in her grandmother on her maternal side and then her paternal aunt and cousin.  She denies any personal history of breast cancer.  She denies any history of cardiac disease.  She denies taking any blood thinners.  She reports she is not a smoker.  Patient reports she currently has Mirena IUD in place.  She denies taking any other hormone replacement.  She does report history of 1 miscarriage.  She denies any personal family history of blood clots or clotting diseases.  She denies any recent surgeries, traumas, infections.  She denies any history of stroke or heart attack.  She denies any history of Crohn's disease or ulcerative colitis.  She denies any history of COPD.  She states she has asthma, but it is controlled.  She denies any history of cancer.  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  She denies any fevers or chills.  Patient does state that she had a reaction to all of the adhesives from her last surgery, including Mepilex border dressings, Dermabond, Steri-Strips.  She states that she even has had a reaction to paper tape.  Summary of Previous Visit: Patient was most recently  seen by Dr. Ulice Bold on 09/21/2023.  At this visit, patient had extremely large breasts and underwent a breast reduction in 2024.  She had over 500 g removed from each breast.  She also had a fibroid removed at the same time from the left breast.  Since then, patient had challenges with fat necrosis on the left side.  Some of the fat necrosis was excised in the office and the area eventually healed.  The area then opened up again and there was some very firm hard tissue underneath that was consistent with fat necrosis.  There was some also breast asymmetry noted.  It was recommended to the patient that she has the fat necrosis excised with possible fat grafting to the left breast for improved symmetry as well as revision reduction to the right breast for symmetry.  Job: Works from home in [employer], planning to take 1 week off  PMH Significant for: IBS, fat necrosis of the breast, breast asymmetry   Past Medical History: Allergies: Allergies  Allergen Reactions   Wound Dressing Adhesive Itching, Dermatitis and Rash    Dermabond (skin glue)    Current Medications:  Current Outpatient Medications:    albuterol (VENTOLIN HFA) 108 (90 Base) MCG/ACT inhaler, Inhale 2 puffs into the lungs as needed., Disp: , Rfl:    buPROPion (WELLBUTRIN XL) 150 MG 24 hr tablet, TAKE 1 TABLET BY MOUTH EVERY DAY, Disp: 90 tablet, Rfl:  1   busPIRone (BUSPAR) 5 MG tablet, Take 1 tablet (5 mg total) by mouth daily., Disp: 30 tablet, Rfl: 1   cetirizine (ZYRTEC) 10 MG tablet, Take 1 tablet by mouth at bedtime., Disp: , Rfl:    mometasone (ELOCON) 0.1 % cream, Apply 1 application topically as needed., Disp: , Rfl:    montelukast (SINGULAIR) 10 MG tablet, Take 1 tablet by mouth at bedtime., Disp: , Rfl:    sodium chloride (OCEAN) 0.65 % SOLN nasal spray, Place 1 spray into both nostrils as needed for congestion., Disp: , Rfl:   Past Medical Problems: Past Medical History:  Diagnosis Date   Allergy    Anemia    Asthma     well controlled   GERD (gastroesophageal reflux disease)    occ   History of chicken pox    Hx: UTI (urinary tract infection)     Past Surgical History: Past Surgical History:  Procedure Laterality Date   BREAST BIOPSY Left 12/24/2021   left breast stereo x clip neg   BREAST BIOPSY WITH RADIO FREQUENCY LOCALIZER Left 01/27/2023   Procedure: BREAST BIOPSY WITH RADIO FREQUENCY LOCALIZER;  Surgeon: Carolan Shiver, MD;  Location: [facility];  Service: General;  Laterality: Left;   BREAST REDUCTION SURGERY Bilateral 01/27/2023   Procedure: MAMMARY REDUCTION  (BREAST);  Surgeon: Peggye Form, DO;  Location: [facility];  Service: Plastics;  Laterality: Bilateral;   WISDOM TOOTH EXTRACTION      Social History: Social History   Socioeconomic History   Marital status: Married    Spouse name: Not on file   Number of children: Not on file   Years of education: Not on file   Highest education level: Bachelor's degree (e.g., BA, AB, BS)  Occupational History   Not on file  Tobacco Use   Smoking status: Never   Smokeless tobacco: Never  Vaping Use   Vaping status: Never Used  Substance and Sexual Activity   Alcohol use: Yes    Comment: rare   Drug use: No   Sexual activity: Not on file  Other Topics Concern   Not on file  Social History Narrative   Lives at home   Social Determinants of Health   Financial Resource Strain: Low Risk  (02/24/2023)   Overall Financial Resource Strain (CARDIA)    Difficulty of Paying Living Expenses: Not hard at all  Food Insecurity: No Food Insecurity (02/24/2023)   Hunger Vital Sign    Worried About Running Out of Food in the Last Year: Never true    Ran Out of Food in the Last Year: Never true  Transportation Needs: No Transportation Needs (02/24/2023)   PRAPARE - Administrator, Civil Service (Medical): No    Lack of Transportation (Non-Medical): No  Physical Activity: Insufficiently Active (02/24/2023)   Exercise Vital Sign     Days of Exercise per Week: 3 days    Minutes of Exercise per Session: 30 min  Stress: Stress Concern Present (02/24/2023)   [employer] of Occupational Health - Occupational Stress Questionnaire    Feeling of Stress : Very much  Social Connections: Socially Integrated (02/24/2023)   Social Connection and Isolation Panel [NHANES]    Frequency of Communication with Friends and Family: More than three times a week    Frequency of Social Gatherings with Friends and Family: Once a week    Attends Religious Services: More than 4 times per year    Active Member of [employer] or Organizations:  Yes    Attends Banker Meetings: More than 4 times per year    Marital Status: Married  Catering manager Violence: Not on file    Family History: Family History  Problem Relation Age of Onset   Hyperlipidemia Mother    Hypertension Father    Cancer Maternal Grandmother        breast   Breast cancer Maternal Grandmother 50   Hyperlipidemia Maternal Grandfather    Heart disease Maternal Grandfather    Hypertension Maternal Grandfather    Stroke Paternal Grandmother    Breast cancer Paternal Aunt     Review of Systems: Denies fevers or chills  Physical Exam: Vital Signs BP 134/77 (BP Location: Left Arm, Patient Position: Sitting, Cuff Size: Normal)   Pulse 97   Ht 5\' 5"  (1.651 m)   Wt 186 lb 9.6 oz (84.6 kg)   SpO2 98%   BMI 31.05 kg/m   Physical Exam  Constitutional:      General: Not in acute distress.    Appearance: Normal appearance. Not ill-appearing.  HENT:     Head: Normocephalic and atraumatic.  Neck:     Musculoskeletal: Normal range of motion.  Cardiovascular:     Rate and Rhythm: Normal rate Pulmonary:     Effort: Pulmonary effort is normal. No respiratory distress.  Musculoskeletal: Normal range of motion.  Skin:    General: Skin is warm and dry.     Findings: No erythema or rash.  Neurological:     Mental Status: Alert and oriented to person, place, and  time. Mental status is at baseline.  Psychiatric:        Mood and Affect: Mood normal.        Behavior: Behavior normal.    Assessment/Plan: The patient is scheduled for excision of left breast fat necrosis/lesion with fat grafting, revision of right breast reduction with Dr. Ulice Bold.  Risks, benefits, and alternatives of procedure discussed, questions answered and consent obtained.    Smoking Status: Non-smoker; Counseling Given?  N/A Last Mammogram: 12/15/2022; Results: BI-RADS Category 2 benign  Caprini Score: 4; Risk Factors include: Age, BMI > 25, and length of planned surgery. Recommendation for mechanical prophylaxis. Encourage early ambulation.   Pictures obtained: 08/02/2023  Post-op Rx sent to pharmacy:  Oxycodone, Keflex, patient states she has Zofran at home  Instructed patient to hold any multivitamins or supplements at least 1 week prior to surgery.  Patient expressed understanding  Patient was provided with the General Surgical Risk consent document and Pain Medication Agreement prior to their appointment.  They had adequate time to read through the risk consent documents and Pain Medication Agreement. We also discussed them in person together during this preop appointment. All of their questions were answered to their satisfaction.  Recommended calling if they have any further questions.  Risk consent form and Pain Medication Agreement to be scanned into patient's chart.  The consent was obtained with risks and complications reviewed which included bleeding, pain, scar, infection and the risk of anesthesia.  The patients questions were answered to the patients expressed satisfaction.    Electronically signed by: Laurena Spies, PA-C 09/30/2023 2:28 PM

## 2023-10-07 ENCOUNTER — Other Ambulatory Visit: Payer: Self-pay | Admitting: Internal Medicine

## 2023-10-08 ENCOUNTER — Other Ambulatory Visit: Payer: Self-pay

## 2023-10-08 ENCOUNTER — Encounter (HOSPITAL_BASED_OUTPATIENT_CLINIC_OR_DEPARTMENT_OTHER): Payer: Self-pay | Admitting: Plastic Surgery

## 2023-10-15 ENCOUNTER — Ambulatory Visit (INDEPENDENT_AMBULATORY_CARE_PROVIDER_SITE_OTHER): Payer: BC Managed Care – PPO | Admitting: Plastic Surgery

## 2023-10-15 ENCOUNTER — Other Ambulatory Visit: Payer: Self-pay | Admitting: General Surgery

## 2023-10-15 DIAGNOSIS — N6489 Other specified disorders of breast: Secondary | ICD-10-CM

## 2023-10-15 DIAGNOSIS — Z1231 Encounter for screening mammogram for malignant neoplasm of breast: Secondary | ICD-10-CM

## 2023-10-15 MED ORDER — CHLORHEXIDINE GLUCONATE 0.12 % MT SOLN
OROMUCOSAL | Status: AC
  Filled 2023-10-15: qty 15

## 2023-10-15 MED ORDER — CEFAZOLIN SODIUM-DEXTROSE 2-4 GM/100ML-% IV SOLN
INTRAVENOUS | Status: AC
  Filled 2023-10-15: qty 100

## 2023-10-15 MED ORDER — TRANEXAMIC ACID-NACL 1000-0.7 MG/100ML-% IV SOLN
INTRAVENOUS | Status: AC
  Filled 2023-10-15: qty 100

## 2023-10-15 NOTE — Progress Notes (Signed)
The patient is a 42 year old female joining me by phone for discussion about her breast surgery on Monday.  The plan is to do a lift on the right side for improved symmetry with release and excision of left breast scar contracture.  She may need lipo filling to fill that area in at the inframammary fold on the left.  The patient is in agreement and we will plan to do this on Monday.  The patient was at home.  I was at the office.  We spent 5 minutes in discussion.  I was sure that the person I was speaking to was the patient.

## 2023-10-18 ENCOUNTER — Ambulatory Visit (HOSPITAL_BASED_OUTPATIENT_CLINIC_OR_DEPARTMENT_OTHER): Payer: Self-pay | Admitting: Anesthesiology

## 2023-10-18 ENCOUNTER — Ambulatory Visit (HOSPITAL_BASED_OUTPATIENT_CLINIC_OR_DEPARTMENT_OTHER)
Admission: RE | Admit: 2023-10-18 | Discharge: 2023-10-18 | Disposition: A | Discharge status: Home / Self Care | Payer: BC Managed Care – PPO | Source: Ambulatory Visit | Attending: Plastic Surgery | Admitting: Plastic Surgery

## 2023-10-18 ENCOUNTER — Other Ambulatory Visit: Payer: Self-pay

## 2023-10-18 ENCOUNTER — Encounter (HOSPITAL_BASED_OUTPATIENT_CLINIC_OR_DEPARTMENT_OTHER)
Admission: RE | Disposition: A | Discharge status: Home / Self Care | Payer: Self-pay | Source: Ambulatory Visit | Attending: Plastic Surgery

## 2023-10-18 ENCOUNTER — Encounter (HOSPITAL_BASED_OUTPATIENT_CLINIC_OR_DEPARTMENT_OTHER): Payer: Self-pay | Admitting: Plastic Surgery

## 2023-10-18 DIAGNOSIS — J45909 Unspecified asthma, uncomplicated: Secondary | ICD-10-CM | POA: Diagnosis not present

## 2023-10-18 DIAGNOSIS — N62 Hypertrophy of breast: Secondary | ICD-10-CM | POA: Insufficient documentation

## 2023-10-18 DIAGNOSIS — M549 Dorsalgia, unspecified: Secondary | ICD-10-CM | POA: Insufficient documentation

## 2023-10-18 DIAGNOSIS — M542 Cervicalgia: Secondary | ICD-10-CM | POA: Diagnosis not present

## 2023-10-18 DIAGNOSIS — K219 Gastro-esophageal reflux disease without esophagitis: Secondary | ICD-10-CM | POA: Diagnosis not present

## 2023-10-18 DIAGNOSIS — Z01818 Encounter for other preprocedural examination: Secondary | ICD-10-CM

## 2023-10-18 HISTORY — PX: LIPOSUCTION WITH LIPOFILLING: SHX6436

## 2023-10-18 HISTORY — PX: BREAST CYST EXCISION: SHX579

## 2023-10-18 LAB — POCT PREGNANCY, URINE: Preg Test, Ur: NEGATIVE

## 2023-10-18 SURGERY — REVISION, RECONSTRUCTION, BREAST
Anesthesia: General | Site: Breast | Laterality: Right

## 2023-10-18 MED ORDER — PHENYLEPHRINE HCL (PRESSORS) 10 MG/ML IV SOLN
INTRAVENOUS | Status: DC | PRN
  Administered 2023-10-18: 80 ug via INTRAVENOUS
  Administered 2023-10-18 (×2): 240 ug via INTRAVENOUS
  Administered 2023-10-18: 160 ug via INTRAVENOUS
  Administered 2023-10-18: 240 ug via INTRAVENOUS

## 2023-10-18 MED ORDER — CEFAZOLIN SODIUM-DEXTROSE 2-4 GM/100ML-% IV SOLN: 2.0000 g | INTRAVENOUS | Status: DC

## 2023-10-18 MED ORDER — DEXAMETHASONE SODIUM PHOSPHATE 10 MG/ML IJ SOLN
INTRAMUSCULAR | Status: AC
  Filled 2023-10-18: qty 1

## 2023-10-18 MED ORDER — FENTANYL CITRATE (PF) 100 MCG/2ML IJ SOLN: 25.0000 ug | INTRAMUSCULAR | Status: DC | PRN

## 2023-10-18 MED ORDER — SODIUM CHLORIDE 0.9 % IV SOLN: 250.0000 mL | INTRAVENOUS | Status: DC | PRN

## 2023-10-18 MED ORDER — LIDOCAINE-EPINEPHRINE 1 %-1:100000 IJ SOLN
INTRAMUSCULAR | Status: DC | PRN
  Administered 2023-10-18: 36 mL via INTRAMUSCULAR

## 2023-10-18 MED ORDER — ACETAMINOPHEN 325 MG PO TABS: 650.0000 mg | ORAL_TABLET | ORAL | Status: DC | PRN

## 2023-10-18 MED ORDER — MIDAZOLAM HCL 2 MG/2ML IJ SOLN
INTRAMUSCULAR | Status: AC
Start: 2023-10-18 — End: ?
  Filled 2023-10-18: qty 2

## 2023-10-18 MED ORDER — SCOPOLAMINE 1 MG/3DAYS TD PT72
1.0000 | MEDICATED_PATCH | TRANSDERMAL | Status: DC
  Administered 2023-10-18: 1.5 mg via TRANSDERMAL

## 2023-10-18 MED ORDER — OXYCODONE HCL 5 MG/5ML PO SOLN: 5.0000 mg | Freq: Once | ORAL | Status: DC | PRN

## 2023-10-18 MED ORDER — OXYCODONE HCL 5 MG PO TABS: 5.0000 mg | ORAL_TABLET | Freq: Once | ORAL | Status: DC | PRN

## 2023-10-18 MED ORDER — FENTANYL CITRATE (PF) 100 MCG/2ML IJ SOLN
25.0000 ug | INTRAMUSCULAR | Status: DC | PRN
  Administered 2023-10-18: 50 ug via INTRAVENOUS

## 2023-10-18 MED ORDER — EPHEDRINE SULFATE (PRESSORS) 50 MG/ML IJ SOLN
INTRAMUSCULAR | Status: DC | PRN
  Administered 2023-10-18: 5 mg via INTRAVENOUS
  Administered 2023-10-18 (×2): 10 mg via INTRAVENOUS

## 2023-10-18 MED ORDER — OXYCODONE HCL 5 MG PO TABS
5.0000 mg | ORAL_TABLET | ORAL | Status: DC | PRN
Start: 2023-10-18 — End: 2023-10-18

## 2023-10-18 MED ORDER — MIDAZOLAM HCL 5 MG/5ML IJ SOLN
INTRAMUSCULAR | Status: DC | PRN
  Administered 2023-10-18: 2 mg via INTRAVENOUS

## 2023-10-18 MED ORDER — SODIUM CHLORIDE 0.9% FLUSH: 3.0000 mL | INTRAVENOUS | Status: DC | PRN

## 2023-10-18 MED ORDER — ONDANSETRON HCL 4 MG/2ML IJ SOLN
INTRAMUSCULAR | Status: AC
  Filled 2023-10-18: qty 2

## 2023-10-18 MED ORDER — PROPOFOL 10 MG/ML IV BOLUS
INTRAVENOUS | Status: AC
  Filled 2023-10-18: qty 20

## 2023-10-18 MED ORDER — LIDOCAINE HCL (CARDIAC) PF 100 MG/5ML IV SOSY
PREFILLED_SYRINGE | INTRAVENOUS | Status: DC | PRN
  Administered 2023-10-18: 100 mg via INTRAVENOUS

## 2023-10-18 MED ORDER — LACTATED RINGERS IV SOLN: INTRAVENOUS | Status: DC

## 2023-10-18 MED ORDER — SODIUM CHLORIDE 0.9% FLUSH: 3.0000 mL | Freq: Two times a day (BID) | INTRAVENOUS | Status: DC

## 2023-10-18 MED ORDER — VASHE WOUND IRRIGATION OPTIME
TOPICAL | Status: DC | PRN
  Administered 2023-10-18: 34 [oz_av]

## 2023-10-18 MED ORDER — ACETAMINOPHEN 500 MG PO TABS
1000.0000 mg | ORAL_TABLET | Freq: Once | ORAL | Status: AC
  Administered 2023-10-18: 1000 mg via ORAL

## 2023-10-18 MED ORDER — PROPOFOL 10 MG/ML IV BOLUS
INTRAVENOUS | Status: DC | PRN
  Administered 2023-10-18: 200 mg via INTRAVENOUS

## 2023-10-18 MED ORDER — SODIUM CHLORIDE 0.9 % IV SOLN
INTRAVENOUS | Status: DC | PRN
  Administered 2023-10-18: 40 mL

## 2023-10-18 MED ORDER — BUPIVACAINE LIPOSOME 1.3 % IJ SUSP
INTRAMUSCULAR | Status: AC
  Filled 2023-10-18: qty 20

## 2023-10-18 MED ORDER — FENTANYL CITRATE (PF) 100 MCG/2ML IJ SOLN
INTRAMUSCULAR | Status: DC | PRN
  Administered 2023-10-18: 50 ug via INTRAVENOUS
  Administered 2023-10-18 (×3): 25 ug via INTRAVENOUS
  Administered 2023-10-18: 100 ug via INTRAVENOUS
  Administered 2023-10-18: 25 ug via INTRAVENOUS

## 2023-10-18 MED ORDER — SCOPOLAMINE 1 MG/3DAYS TD PT72
MEDICATED_PATCH | TRANSDERMAL | Status: AC
  Filled 2023-10-18: qty 1

## 2023-10-18 MED ORDER — LIDOCAINE 2% (20 MG/ML) 5 ML SYRINGE
INTRAMUSCULAR | Status: AC
  Filled 2023-10-18: qty 5

## 2023-10-18 MED ORDER — ONDANSETRON HCL 4 MG/2ML IJ SOLN
INTRAMUSCULAR | Status: DC | PRN
  Administered 2023-10-18: 4 mg via INTRAVENOUS

## 2023-10-18 MED ORDER — FENTANYL CITRATE (PF) 100 MCG/2ML IJ SOLN
INTRAMUSCULAR | Status: AC
  Filled 2023-10-18: qty 2

## 2023-10-18 MED ORDER — CEFAZOLIN SODIUM-DEXTROSE 2-4 GM/100ML-% IV SOLN
INTRAVENOUS | Status: AC
  Filled 2023-10-18: qty 100

## 2023-10-18 MED ORDER — LIDOCAINE HCL 1 % IJ SOLN
INTRAVENOUS | Status: DC | PRN
  Administered 2023-10-18: 1000 mL

## 2023-10-18 MED ORDER — AMISULPRIDE (ANTIEMETIC) 5 MG/2ML IV SOLN
10.0000 mg | Freq: Once | INTRAVENOUS | Status: DC | PRN
Start: 2023-10-18 — End: 2023-10-18

## 2023-10-18 MED ORDER — CHLORHEXIDINE GLUCONATE CLOTH 2 % EX PADS: 6.0000 | MEDICATED_PAD | Freq: Once | CUTANEOUS | Status: DC

## 2023-10-18 MED ORDER — DEXAMETHASONE SODIUM PHOSPHATE 4 MG/ML IJ SOLN
INTRAMUSCULAR | Status: DC | PRN
  Administered 2023-10-18: 10 mg via INTRAVENOUS

## 2023-10-18 MED ORDER — ROCURONIUM BROMIDE 10 MG/ML (PF) SYRINGE
PREFILLED_SYRINGE | INTRAVENOUS | Status: AC
  Filled 2023-10-18: qty 10

## 2023-10-18 MED ORDER — ACETAMINOPHEN 500 MG PO TABS
ORAL_TABLET | ORAL | Status: AC
  Filled 2023-10-18: qty 2

## 2023-10-18 MED ORDER — ACETAMINOPHEN 325 MG RE SUPP: 650.0000 mg | RECTAL | Status: DC | PRN

## 2023-10-18 SURGICAL SUPPLY — 72 items
BINDER ABDOMINAL 10 UNV 27-48 (MISCELLANEOUS) IMPLANT
BINDER ABDOMINAL 12 SM 30-45 (SOFTGOODS) IMPLANT
BINDER ABDOMINAL 9 SM 30-45 (SOFTGOODS) IMPLANT
BINDER BREAST LRG (GAUZE/BANDAGES/DRESSINGS) IMPLANT
BINDER BREAST MEDIUM (GAUZE/BANDAGES/DRESSINGS) IMPLANT
BINDER BREAST XLRG (GAUZE/BANDAGES/DRESSINGS) IMPLANT
BINDER BREAST XXLRG (GAUZE/BANDAGES/DRESSINGS) IMPLANT
BIOPATCH RED 1 DISK 7.0 (GAUZE/BANDAGES/DRESSINGS) IMPLANT
BLADE HEX COATED 2.75 (ELECTRODE) IMPLANT
BLADE KNIFE PERSONA 10 (BLADE) ×6 IMPLANT
BLADE SURG 15 STRL LF DISP TIS (BLADE) ×3 IMPLANT
CANISTER SUCT 1200ML W/VALVE (MISCELLANEOUS) ×3 IMPLANT
CLEANSER WND VASHE 34 (WOUND CARE) ×3 IMPLANT
COVER BACK TABLE 60X90IN (DRAPES) ×3 IMPLANT
COVER MAYO STAND STRL (DRAPES) ×3 IMPLANT
DERMABOND ADVANCED .7 DNX12 (GAUZE/BANDAGES/DRESSINGS) ×6 IMPLANT
DRAIN CHANNEL 19F RND (DRAIN) IMPLANT
DRAPE LAPAROSCOPIC ABDOMINAL (DRAPES) ×3 IMPLANT
DRAPE UTILITY XL STRL (DRAPES) ×3 IMPLANT
DRESSING MEPILEX FLEX 4X4 (GAUZE/BANDAGES/DRESSINGS) IMPLANT
DRSG MEPILEX FLEX 4X4 (GAUZE/BANDAGES/DRESSINGS) ×6
DRSG MEPILEX POST OP 4X8 (GAUZE/BANDAGES/DRESSINGS) ×6 IMPLANT
DRSG TEGADERM 4X4.75 (GAUZE/BANDAGES/DRESSINGS) IMPLANT
ELECT BLADE 4.0 EZ CLEAN MEGAD (MISCELLANEOUS) ×3
ELECT REM PT RETURN 9FT ADLT (ELECTROSURGICAL) ×3
ELECTRODE BLDE 4.0 EZ CLN MEGD (MISCELLANEOUS) ×3 IMPLANT
ELECTRODE REM PT RTRN 9FT ADLT (ELECTROSURGICAL) ×3 IMPLANT
EVACUATOR SILICONE 100CC (DRAIN) IMPLANT
EXTRACTOR CANIST REVOLVE STRL (CANNISTER) ×3 IMPLANT
GAUZE PAD ABD 8X10 STRL (GAUZE/BANDAGES/DRESSINGS) ×6 IMPLANT
GLOVE BIO SURGEON STRL SZ 6.5 (GLOVE) ×9 IMPLANT
GLOVE BIO SURGEON STRL SZ7.5 (GLOVE) ×3 IMPLANT
GLOVE BIOGEL PI IND STRL 7.0 (GLOVE) IMPLANT
GLOVE BIOGEL PI IND STRL 8 (GLOVE) IMPLANT
GOWN STRL REUS W/ TWL LRG LVL3 (GOWN DISPOSABLE) ×6 IMPLANT
GOWN STRL REUS W/ TWL XL LVL3 (GOWN DISPOSABLE) ×3 IMPLANT
IV LACTATED RINGERS 1000ML (IV SOLUTION) ×6 IMPLANT
LINER CANISTER 1000CC FLEX (MISCELLANEOUS) ×3 IMPLANT
NDL FILTER BLUNT 18X1 1/2 (NEEDLE) IMPLANT
NDL HYPO 25X1 1.5 SAFETY (NEEDLE) ×6 IMPLANT
NEEDLE FILTER BLUNT 18X1 1/2 (NEEDLE) ×3
NEEDLE HYPO 25X1 1.5 SAFETY (NEEDLE) ×6
NS IRRIG 1000ML POUR BTL (IV SOLUTION) IMPLANT
PACK BASIN DAY SURGERY FS (CUSTOM PROCEDURE TRAY) ×3 IMPLANT
PAD ALCOHOL SWAB (MISCELLANEOUS) ×3 IMPLANT
PAD FOAM SILICONE BACKED (GAUZE/BANDAGES/DRESSINGS) IMPLANT
PENCIL SMOKE EVACUATOR (MISCELLANEOUS) ×3 IMPLANT
PIN SAFETY STERILE (MISCELLANEOUS) IMPLANT
SLEEVE SCD COMPRESS KNEE MED (STOCKING) ×3 IMPLANT
SPIKE FLUID TRANSFER (MISCELLANEOUS) IMPLANT
SPONGE T-LAP 18X18 ~~LOC~~+RFID (SPONGE) ×6 IMPLANT
STRIP SUTURE WOUND CLOSURE 1/2 (MISCELLANEOUS) ×12 IMPLANT
SUT MNCRL AB 4-0 PS2 18 (SUTURE) ×12 IMPLANT
SUT MON AB 3-0 SH27 (SUTURE) ×12 IMPLANT
SUT MON AB 5-0 PS2 18 (SUTURE) ×6 IMPLANT
SUT PDS 3-0 CT2 (SUTURE) ×6
SUT PDS II 3-0 CT2 27 ABS (SUTURE) ×12 IMPLANT
SUT SILK 3 0 PS 1 (SUTURE) IMPLANT
SYR 10ML LL (SYRINGE) ×15 IMPLANT
SYR 3ML 18GX1 1/2 (SYRINGE) IMPLANT
SYR 50ML LL SCALE MARK (SYRINGE) ×3 IMPLANT
SYR BULB IRRIG 60ML STRL (SYRINGE) ×3 IMPLANT
SYR CONTROL 10ML LL (SYRINGE) ×6 IMPLANT
SYR TOOMEY 50ML (SYRINGE) IMPLANT
TAPE MEASURE VINYL STERILE (MISCELLANEOUS) IMPLANT
TOWEL GREEN STERILE FF (TOWEL DISPOSABLE) ×9 IMPLANT
TRAY DSU PREP LF (CUSTOM PROCEDURE TRAY) ×3 IMPLANT
TUBE CONNECTING 20X1/4 (TUBING) ×3 IMPLANT
TUBING INFILTRATION IT-10001 (TUBING) ×3 IMPLANT
TUBING SET GRADUATE ASPIR 12FT (MISCELLANEOUS) ×3 IMPLANT
UNDERPAD 30X36 HEAVY ABSORB (UNDERPADS AND DIAPERS) ×6 IMPLANT
YANKAUER SUCT BULB TIP NO VENT (SUCTIONS) ×3 IMPLANT

## 2023-10-18 NOTE — Discharge Instructions (Addendum)
INSTRUCTIONS FOR AFTER BREAST SURGERY   You will likely have some questions about what to expect following your operation.  The following information will help you and your family understand what to expect when you are discharged from the hospital.  It is important to follow these guidelines to help ensure a smooth recovery and reduce complication.  Postoperative instructions include information on: diet, wound care, medications and physical activity.  AFTER SURGERY Expect to go home after the procedure.  In some cases, you may need to spend one night in the hospital for observation.  DIET Breast surgery does not require a specific diet.  However, the healthier you eat the better your body will heal. It is important to increasing your protein intake.  This means limiting the foods with sugar and carbohydrates.  Focus on vegetables and some meat.  If you have liposuction during your procedure be sure to drink water.  If your urine is bright yellow, then it is concentrated, and you need to drink more water.  As a general rule after surgery, you should have 8 ounces of water every hour while awake.  If you find you are persistently nauseated or unable to take in liquids let us know.  NO TOBACCO USE or EXPOSURE.  This will slow your healing process and lead to a wound.  WOUND CARE Leave the binder on for 3 days . Use fragrance free soap like Dial, Dove or [place].   After 3 days you can remove the binder to shower. Once dry apply binder or sports bra. If you have liposuction you will have a soft and spongy dressing (Lipofoam) that helps prevent creases in your skin.  Remove before you shower and then replace it.  It is also available on [employer]. If you have steri-strips / tape directly attached to your skin leave them in place. It is OK to get these wet.   No baths, pools or hot tubs for four weeks. We close your incision to leave the smallest and best-looking scar. No ointment or creams on your incisions  for four weeks.  No Neosporin (Too many skin reactions).  A few weeks after surgery you can use Mederma and start massaging the scar. We ask you to wear your binder or sports bra for the first 6 weeks around the clock, including while sleeping. This provides added comfort and helps reduce the fluid accumulation at the surgery site. NO Ice or heating pads to the operative site.  You have a very high risk of a BURN before you feel the temperature change.  ACTIVITY No heavy lifting until cleared by the doctor.  This usually means no more than a half-gallon of milk.  It is OK to walk and climb stairs. Moving your legs is very important to decrease your risk of a blood clot.  It will also help keep you from getting deconditioned.  Every 1 to 2 hours get up and walk for 5 minutes. This will help with a quicker recovery back to normal.  Let pain be your guide so you don't do too much.  This time is for you to recover.  You will be more comfortable if you sleep and rest with your head elevated either with a few pillows under you or in a recliner.  No stomach sleeping for a three months.  WORK Everyone returns to work at different times. As a rough guide, most people take at least 1 - 2 weeks off prior to returning to work. If  you need documentation for your job, give the forms to the front staff at the clinic.  DRIVING Arrange for someone to bring you home from the hospital after your surgery.  You may be able to drive a few days after surgery but not while taking any narcotics or valium.  BOWEL MOVEMENTS Constipation can occur after anesthesia and while taking pain medication.  It is important to stay ahead for your comfort.  We recommend taking Milk of Magnesia (2 tablespoons; twice a day) while taking the pain pills.  MEDICATIONS You may be prescribed should start after surgery At your preoperative visit for you history and physical you may have been given the following medications: An antibiotic: Start  this medication when you get home and take according to the instructions on the bottle. Zofran 4 mg:  This is to treat nausea and vomiting.  You can take this every 6 hours as needed and only if needed. Valium 2 mg for breast cancer patients: This is for muscle tightness if you have an implant or expander. This will help relax your muscle which also helps with pain control.  This can be taken every 12 hours as needed. Don't drive after taking this medication. Norco (hydrocodone/acetaminophen) 5/325 mg:  This is only to be used after you have taken the Motrin or the Tylenol. Every 8 hours as needed.   Over the counter Medication to take: Ibuprofen (Motrin) 600 mg:  Take this every 6 hours.  If you have additional pain then take 500 mg of the Tylenol every 8 hours.  Only take the Norco after you have tried these two. MiraLAX or Milk of Magnesia: Take this according to the bottle if you take the Norco.  WHEN TO CALL Call your surgeon's office if any of the following occur: Fever 101 degrees F or greater Excessive bleeding or fluid from the incision site. Pain that increases over time without aid from the medications Redness, warmth, or pus draining from incision sites Persistent nausea or inability to take in liquids Severe misshapen area that underwent the operation.  You may have Tylenol again after 4:40pm tonight, if needed.   Post Anesthesia Home Care Instructions  Activity: Get plenty of rest for the remainder of the day. A responsible individual must stay with you for 24 hours following the procedure.  For the next 24 hours, DO NOT: -Drive a car -Advertising copywriter -Drink alcoholic beverages -Take any medication unless instructed by your physician -Make any legal decisions or sign important papers.  Meals: Start with liquid foods such as gelatin or soup. Progress to regular foods as tolerated. Avoid greasy, spicy, heavy foods. If nausea and/or vomiting occur, drink only clear  liquids until the nausea and/or vomiting subsides. Call your physician if vomiting continues.  Special Instructions/Symptoms: Your throat may feel dry or sore from the anesthesia or the breathing tube placed in your throat during surgery. If this causes discomfort, gargle with warm salt water. The discomfort should disappear within 24 hours.  If you had a scopolamine patch placed behind your ear for the management of post- operative nausea and/or vomiting:  1. The medication in the patch is effective for 72 hours, after which it should be removed.  Wrap patch in a tissue and discard in the trash. Wash hands thoroughly with soap and water. 2. You may remove the patch earlier than 72 hours if you experience unpleasant side effects which may include dry mouth, dizziness or visual disturbances. 3. Avoid touching the patch.  Wash your hands with soap and water after contact with the patch.   Information for Discharge Teaching: EXPAREL (bupivacaine liposome injectable suspension)   Pain relief is important to your recovery. The goal is to control your pain so you can move easier and return to your normal activities as soon as possible after your procedure. Your physician may use several types of medicines to manage pain, swelling, and more.  Your surgeon or anesthesiologist gave you EXPAREL(bupivacaine) to help control your pain after surgery.  EXPAREL is a local anesthetic designed to release slowly over an extended period of time to provide pain relief by numbing the tissue around the surgical site. EXPAREL is designed to release pain medication over time and can control pain for up to 72 hours. Depending on how you respond to EXPAREL, you may require less pain medication during your recovery. EXPAREL can help reduce or eliminate the need for opioids during the first few days after surgery when pain relief is needed the most. EXPAREL is not an opioid and is not addictive. It does not cause sleepiness  or sedation.   Important! A teal colored band has been placed on your arm with the date, time and amount of EXPAREL you have received. Please leave this armband in place for the full 96 hours following administration, and then you may remove the band. If you return to the hospital for any reason within 96 hours following the administration of EXPAREL, the armband provides important information that your health care providers to know, and alerts them that you have received this anesthetic.    Possible side effects of EXPAREL: Temporary loss of sensation or ability to move in the area where medication was injected. Nausea, vomiting, constipation Rarely, numbness and tingling in your mouth or lips, lightheadedness, or anxiety may occur. Call your doctor right away if you think you may be experiencing any of these sensations, or if you have other questions regarding possible side effects.  Follow all other discharge instructions given to you by your surgeon or nurse. Eat a healthy diet and drink plenty of water or other fluids.

## 2023-10-18 NOTE — Op Note (Signed)
Op note:    DATE OF PROCEDURE: 10/18/2023  LOCATION: [facility]  SURGEON: Foster Simpson, DO  PREOPERATIVE DIAGNOSIS 1. Macromastia 2. Neck Pain 3. Back Pain  POSTOPERATIVE DIAGNOSIS 1. Macromastia 2. Neck Pain 3. Back Pain  PROCEDURES 1. Right breast Mastopexy/reduction 30 g,  2. Left scar contracture excision 4 x 4 cm soft tissue 3. Lipofilling of left breast 130 cc   COMPLICATIONS: None.  DRAINS: none  INDICATIONS FOR PROCEDURE Tara Davila is a 42 y.o. year-old female born on Oct 11, 1981,with a history of symptomatic macromastia with concominant back pain, neck pain, shoulder grooving from her bra.  She underwent a reduction and had subsequent left breast contracture with right breast asymmetry. MRN: [id number]  CONSENT Informed consent was obtained directly from the patient. The risks, benefits and alternatives were fully discussed. Specific risks including but not limited to bleeding, infection, hematoma, seroma, scarring, pain, nipple necrosis, asymmetry, poor cosmetic results, and need for further surgery were discussed. The patient's questions were answered.  DESCRIPTION OF PROCEDURE  Patient was brought into the operating room and rested on the operating room table in the supine position.  SCDs were placed and appropriate padding was performed.  Antibiotics were given. The patient underwent general anesthesia and the chest was prepped and draped in a sterile fashion.  A timeout was performed and all information was confirmed to be correct by those in the room. Tumescent was infused into the abdominal adipose tissue.  Liposuction was done and collected in the [employer] according to the manufacture guidelines.  Left side: Preoperative markings were confirmed.  Incision lines were injected with local containing epinephrine.  After waiting for vasoconstriction, the marked lines were incised with a #15 blade.  The perioareolar skin was  de-epithelialized.  The areola was lifted into position and secured with a 3-0 Monocryl.  The skin was brought together with the 4-0 Monocryl. The vertical limb was de-epithelialized.  The bovie was then used to follow the small opening which lead to several breast cysts and fat necrosis.  This was excised 4 x 4 cm with the bovie and #10 blade. Hemostasis was achieved. The pocket was irrigated with Vashe.  The deep tissues were approximated with 3-0 PDS sutures. The skin was closed with deep dermal 3-0 Monocryl and subcuticular 4-0 Monocryl sutures.  The harvested adipose was then injected into the lower poll of the left breast for a total of 130 cc using the 10 cc syringes.  Right side: Preoperative markings were confirmed.  Incision lines were injected with local containing epinephrine.  After waiting for vasoconstriction, the marked lines were incised with a #15 blade.  The perioareolar skin was de-epithelialized.  The areola was lifted into position and secured with a 3-0 Monocryl.  The skin was brought together with the 4-0 Monocryl. The vertical limb was de-epithelialized.  There was a cyst noted and was removed.  Hemostasis was achieved. The pocket was irrigated with Vashe.  The deep tissues were approximated with 3-0 PDS sutures. The skin was closed with deep dermal 3-0 Monocryl and subcuticular 4-0 Monocryl sutures.  Liposuction was done laterally for improved symmetry.  The nipple and skin flaps had good capillary refill at the end of the procedure.  A breast binder and ABDs were placed.  Sterile dressings were applied. The nipple and skin flaps had good capillary refill at the end of the procedure.  The patient tolerated the procedure well. The patient was allowed to wake from anesthesia and taken  to the recovery room in satisfactory condition.

## 2023-10-18 NOTE — Interval H&P Note (Signed)
History and Physical Interval Note:  10/18/2023 11:37 AM  Tara Davila  has presented today for surgery, with the diagnosis of LEFT breast wound.  The various methods of treatment have been discussed with the patient and family. After consideration of risks, benefits and other options for treatment, the patient has consented to  Procedure(s): revision of right breast reduction for symmetry (Right) Excision of left breast lesion (Left) fat necrosis with fat grafting (Left) as a surgical intervention.  The patient's history has been reviewed, patient examined, no change in status, stable for surgery.  I have reviewed the patient's chart and labs.  Questions were answered to the patient's satisfaction.     [name] [name]

## 2023-10-18 NOTE — Anesthesia Procedure Notes (Signed)
Procedure Name: LMA Insertion Date/Time: 10/18/2023 12:09 PM  Performed by: Karen Kitchens, CRNAPre-anesthesia Checklist: Patient identified, Emergency Drugs available, Suction available and Patient being monitored Patient Re-evaluated:Patient Re-evaluated prior to induction Oxygen Delivery Method: Circle system utilized Preoxygenation: Pre-oxygenation with 100% oxygen Induction Type: IV induction Ventilation: Mask ventilation without difficulty LMA: LMA inserted LMA Size: 4.0 Number of attempts: 1 Airway Equipment and Method: Bite block Placement Confirmation: positive ETCO2, CO2 detector and breath sounds checked- equal and bilateral Tube secured with: Tape Dental Injury: Teeth and Oropharynx as per pre-operative assessment

## 2023-10-18 NOTE — Anesthesia Postprocedure Evaluation (Signed)
Anesthesia Post Note  Patient: Tara Davila  Procedure(s) Performed: revision of right breast reduction for symmetry (Right: Breast) Excision of left breast lesion (Left: Breast) fat necrosis with fat grafting (Left: Abdomen)     Patient location during evaluation: PACU Anesthesia Type: General Level of consciousness: awake Pain management: pain level controlled Vital Signs Assessment: post-procedure vital signs reviewed and stable Respiratory status: spontaneous breathing, nonlabored ventilation and respiratory function stable Cardiovascular status: blood pressure returned to baseline and stable Postop Assessment: no apparent nausea or vomiting Anesthetic complications: no   No notable events documented.  Last Vitals:  Vitals:   10/18/23 1430 10/18/23 1445  BP: 120/82 124/85  Pulse: (!) 103 (!) 110  Resp: 11 15  Temp:    SpO2: 100% 97%    Last Pain:  Vitals:   10/18/23 1445  TempSrc:   PainSc: 0-No pain                 Linton Rump

## 2023-10-18 NOTE — Anesthesia Preprocedure Evaluation (Addendum)
Anesthesia Evaluation  Patient identified by MRN, date of birth, ID band Patient awake    Reviewed: Allergy & Precautions, NPO status , Patient's Chart, lab work & pertinent test results  History of Anesthesia Complications Negative for: history of anesthetic complications  Airway Mallampati: I  TM Distance: >3 FB Neck ROM: Full    Dental  (+) Dental Advisory Given   Pulmonary neg shortness of breath, asthma (used inhaler this morning) , neg sleep apnea, neg COPD, neg recent URI   Pulmonary exam normal breath sounds clear to auscultation       Cardiovascular negative cardio ROS  Rhythm:Regular Rate:Normal     Neuro/Psych negative neurological ROS     GI/Hepatic Neg liver ROS,GERD  ,,  Endo/Other  negative endocrine ROS    Renal/GU negative Renal ROS     Musculoskeletal   Abdominal   Peds  Hematology  (+) Blood dyscrasia, anemia   Anesthesia Other Findings   Reproductive/Obstetrics                             Anesthesia Physical Anesthesia Plan  ASA: 2  Anesthesia Plan: General   Post-op Pain Management: Tylenol PO (pre-op)*   Induction: Intravenous  PONV Risk Score and Plan: 3 and Ondansetron, Dexamethasone and Treatment may vary due to age or medical condition  Airway Management Planned: LMA  Additional Equipment:   Intra-op Plan:   Post-operative Plan: Extubation in OR  Informed Consent: I have reviewed the patients History and Physical, chart, labs and discussed the procedure including the risks, benefits and alternatives for the proposed anesthesia with the patient or authorized representative who has indicated his/her understanding and acceptance.     Dental advisory given  Plan Discussed with: CRNA and Anesthesiologist  Anesthesia Plan Comments: (Risks of general anesthesia discussed including, but not limited to, sore throat, hoarse voice, chipped/damaged teeth,  injury to vocal cords, nausea and vomiting, allergic reactions, lung infection, heart attack, stroke, and death. All questions answered. )        Anesthesia Quick Evaluation

## 2023-10-18 NOTE — Transfer of Care (Signed)
Immediate Anesthesia Transfer of Care Note  Patient: Tara Davila  Procedure(s) Performed: revision of right breast reduction for symmetry (Right: Breast) Excision of left breast lesion (Left: Breast) fat necrosis with fat grafting (Left: Abdomen)  Patient Location: PACU  Anesthesia Type:General  Level of Consciousness: awake, alert , and oriented  Airway & Oxygen Therapy: Patient Spontanous Breathing and Patient connected to nasal cannula oxygen  Post-op Assessment: Report given to RN and Post -op Vital signs reviewed and stable  Post vital signs: Reviewed and stable  Last Vitals:  Vitals Value Taken Time  BP 121/73 10/18/23 1356  Temp 36.7 C 10/18/23 1356  Pulse 109 10/18/23 1356  Resp 10 10/18/23 1356  SpO2 100 % 10/18/23 1356    Last Pain:  Vitals:   10/18/23 1037  TempSrc: Oral  PainSc: 0-No pain         Complications: No notable events documented.

## 2023-10-19 ENCOUNTER — Encounter: Payer: Self-pay | Admitting: Plastic Surgery

## 2023-10-19 ENCOUNTER — Encounter (HOSPITAL_BASED_OUTPATIENT_CLINIC_OR_DEPARTMENT_OTHER): Payer: Self-pay | Admitting: Plastic Surgery

## 2023-10-19 LAB — SURGICAL PATHOLOGY

## 2023-10-19 NOTE — Telephone Encounter (Signed)
I called the patient and spoke with her in regards to her MyChart message.  Discussed with the patient that her bruising and soreness is most likely from the liposuction that was performed in the area.  Recommended that she continue to monitor.  Also recommended Arnica which may help with resolution of bruising.  Patient expressed understanding.  I instructed the patient to call us if she has any further questions or concerns about anything or feels the area is worsening.  Patient expressed understanding.

## 2023-10-23 ENCOUNTER — Other Ambulatory Visit: Payer: Self-pay | Admitting: Internal Medicine

## 2023-10-29 ENCOUNTER — Encounter: Payer: Self-pay | Admitting: Plastic Surgery

## 2023-10-29 ENCOUNTER — Ambulatory Visit: Payer: BC Managed Care – PPO | Admitting: Plastic Surgery

## 2023-10-29 VITALS — BP 124/83 | HR 100 | Ht 65.0 in | Wt 184.0 lb

## 2023-10-29 DIAGNOSIS — N6489 Other specified disorders of breast: Secondary | ICD-10-CM

## 2023-10-29 NOTE — Progress Notes (Signed)
The patient is a 42 year old female here for follow-up after revision of breast reduction.  Overall she is doing really well.  She has really good symmetry.  A lot of bruising and swelling as expected.  I deferred pictures till next visit and we will see her back in about 2 weeks.

## 2023-11-09 ENCOUNTER — Ambulatory Visit: Payer: BC Managed Care – PPO | Admitting: Internal Medicine

## 2023-11-09 ENCOUNTER — Telehealth: Payer: Self-pay | Admitting: Plastic Surgery

## 2023-11-09 NOTE — Telephone Encounter (Signed)
Pt is ok to see Gerre Pebbles same day same time, she just called back

## 2023-11-09 NOTE — Telephone Encounter (Signed)
Pt was sch with Georga Kaufmann 11-18-23 but she is out of town, called pt and Lv[employer] for the pt to call the office to see if she is ok to see Gerre Pebbles same day same time or if we need to move to a diff day.

## 2023-11-18 ENCOUNTER — Ambulatory Visit: Payer: BC Managed Care – PPO | Admitting: Physician Assistant

## 2023-11-18 VITALS — BP 109/77 | HR 86

## 2023-11-18 DIAGNOSIS — Z9889 Other specified postprocedural states: Secondary | ICD-10-CM

## 2023-11-18 NOTE — Progress Notes (Signed)
Patient is a pleasant 42 year old female with history of breast reduction performed 01/2023 now s/p right breast mastopexy with left scar contracture excision and fat grafting from abdomen performed 10/18/2023 Dr. Ulice Bold who presents to clinic for postoperative follow-up.  She was last seen here in clinic on 10/29/2023.  At that time, she was doing really well.  Deferred photos until subsequent encounter.  Today, patient is doing well.  She states that she had some tenderness in the right axilla where liposuction was performed as well as some mild tenderness in the abdomen, but significantly improved compared to previous encounter.  She is overall doing quite well and is pleased with the outcome of her surgery.  No specific complaints.  She is approximately 4 weeks postop.  On exam, breasts have excellent shape and symmetry, improved compared to preoperative state.  Her left scar contracture excision has healed nicely.  She has not had any issues with wounds or drainage.  She is applying a liberal amount of Vaseline throughout.  Discussed ongoing restrictions/compression garments for another 1 to 2 weeks, but otherwise cleared from a postoperative standpoint.  A few scattered sutures are removed without complication or difficulty.  Recommending transition from Vaseline to silicone scar gels twice daily x 3 months beginning next week.  Increase activity as tolerated.  Follow-up only as needed.  Picture(s) obtained of the patient and placed in the chart were with the patient's or guardian's permission.

## 2023-12-22 ENCOUNTER — Encounter: Payer: Self-pay | Admitting: Internal Medicine

## 2023-12-22 ENCOUNTER — Ambulatory Visit: Payer: 59 | Admitting: Internal Medicine

## 2023-12-22 VITALS — BP 116/70 | HR 90 | Temp 98.0°F | Resp 16 | Ht 65.0 in | Wt 187.4 lb

## 2023-12-22 DIAGNOSIS — N641 Fat necrosis of breast: Secondary | ICD-10-CM

## 2023-12-22 DIAGNOSIS — E7800 Pure hypercholesterolemia, unspecified: Secondary | ICD-10-CM | POA: Diagnosis not present

## 2023-12-22 DIAGNOSIS — Z713 Dietary counseling and surveillance: Secondary | ICD-10-CM | POA: Diagnosis not present

## 2023-12-22 DIAGNOSIS — F439 Reaction to severe stress, unspecified: Secondary | ICD-10-CM

## 2023-12-22 DIAGNOSIS — R000 Tachycardia, unspecified: Secondary | ICD-10-CM

## 2023-12-22 LAB — COMPREHENSIVE METABOLIC PANEL
ALT: 12 U/L (ref 0–35)
AST: 13 U/L (ref 0–37)
Albumin: 4.4 g/dL (ref 3.5–5.2)
Alkaline Phosphatase: 89 U/L (ref 39–117)
BUN: 15 mg/dL (ref 6–23)
CO2: 28 meq/L (ref 19–32)
Calcium: 9.5 mg/dL (ref 8.4–10.5)
Chloride: 103 meq/L (ref 96–112)
Creatinine, Ser: 0.77 mg/dL (ref 0.40–1.20)
GFR: 94.59 mL/min (ref >60.00)
Glucose, Bld: 86 mg/dL (ref 70–99)
Potassium: 4 meq/L (ref 3.5–5.1)
Sodium: 137 meq/L (ref 135–145)
Total Bilirubin: 0.2 mg/dL (ref 0.2–1.2)
Total Protein: 7.5 g/dL (ref 6.0–8.3)

## 2023-12-22 LAB — LIPID PANEL
Cholesterol: 158 mg/dL (ref 0–200)
HDL: 51.1 mg/dL (ref >39.00)
LDL Cholesterol: 81 mg/dL (ref 0–99)
NonHDL: 106.74
Total CHOL/HDL Ratio: 3
Triglycerides: 127 mg/dL (ref 0.0–149.0)
VLDL: 25.4 mg/dL (ref 0.0–40.0)

## 2023-12-22 MED ORDER — BUSPIRONE HCL 5 MG PO TABS: 5.0000 mg | ORAL_TABLET | Freq: Every day | ORAL | 1 refills | Status: DC

## 2023-12-22 MED ORDER — BUPROPION HCL ER (XL) 150 MG PO TB24: 150.0000 mg | ORAL_TABLET | Freq: Every day | ORAL | 1 refills | Status: DC

## 2023-12-22 NOTE — Assessment & Plan Note (Signed)
Insurance - does not Art therapist. Discussed insulin resistance.  Discussed metformin and weight management referral. Check insulin level.  Low carb diet and exercise.  Follow.

## 2023-12-22 NOTE — Assessment & Plan Note (Signed)
Previously noted per her report after surgery and at surgery visits. Denies any increased heart rate or palpitations.  Pulse ok today on exam. Discussed further evaluation. Prefers to follow.

## 2023-12-22 NOTE — Assessment & Plan Note (Signed)
Increased stress and anxiety as outlined.  Discussed.  Continues on wellbutrin.  Continues on buspar.  Follow closely.

## 2023-12-22 NOTE — Progress Notes (Signed)
Subjective:    Patient ID: Tara Davila, female    DOB: 1981-05-11, 43 y.o.   MRN: [id number]  Patient here for  Chief Complaint  Patient presents with   Medical Management of Chronic Issues    HPI Here for a scheduled follow up.  Is s/p bilateral breast reduction in conjunction with left breast biopsy with radiofrequency localization by Dr. Ulice Bold with Dr. Hazle Quant on 01/27/2023. Postoperatively she developed a wound to the left breast and underwent excision and primary closure on 04/14/2023. Is now s/p right breast mastopexy with left scar contracture excision and fat grafting from abdomen performed 10/18/2023. Increased stress related to above. Discussed last visit. Felt needed something more.  On wellbutrin. Buspar added. Has changed jobs. Breast doing better. Stress is better. She feels she is doing well on her current medication regimen. Did mention that her heart rate was noted to be elevated at her surgery visits and after her surgery. She feels was related to increased stress. Discussed with her. She denies any chest pain or sob. No increased heart rate or palpitations. No dizziness or light headedness. Overall feels she is doing well. Discussed metformin and insulin resistance. Discussed diet and exercise.    Past Medical History:  Diagnosis Date   Allergy    Anemia    Asthma    well controlled   GERD (gastroesophageal reflux disease)    occ   History of chicken pox    Hx: UTI (urinary tract infection)    Past Surgical History:  Procedure Laterality Date   BREAST BIOPSY Left 12/24/2021   left breast stereo x clip neg   BREAST BIOPSY WITH RADIO FREQUENCY LOCALIZER Left 01/27/2023   Procedure: BREAST BIOPSY WITH RADIO FREQUENCY LOCALIZER;  Surgeon: Carolan Shiver, MD;  Location: [facility];  Service: General;  Laterality: Left;   BREAST CYST EXCISION Left 10/18/2023   Procedure: Excision of left breast lesion;  Surgeon: Peggye Form, DO;  Location:  [facility];  Service: Plastics;  Laterality: Left;   BREAST REDUCTION SURGERY Bilateral 01/27/2023   Procedure: MAMMARY REDUCTION  (BREAST);  Surgeon: Peggye Form, DO;  Location: [facility];  Service: Plastics;  Laterality: Bilateral;   LIPOSUCTION WITH LIPOFILLING Left 10/18/2023   Procedure: fat necrosis with fat grafting;  Surgeon: Peggye Form, DO;  Location: [facility];  Service: Plastics;  Laterality: Left;   WISDOM TOOTH EXTRACTION     Family History  Problem Relation Age of Onset   Hyperlipidemia Mother    Hypertension Father    Cancer Maternal Grandmother        breast   Breast cancer Maternal Grandmother 56   Hyperlipidemia Maternal Grandfather    Heart disease Maternal Grandfather    Hypertension Maternal Grandfather    Stroke Paternal Grandmother    Breast cancer Paternal Aunt    Social History   Socioeconomic History   Marital status: Married    Spouse name: Not on file   Number of children: Not on file   Years of education: Not on file   Highest education level: Bachelor's degree (e.g., BA, AB, BS)  Occupational History   Not on file  Tobacco Use   Smoking status: Never   Smokeless tobacco: Never  Vaping Use   Vaping status: Never Used  Substance and Sexual Activity   Alcohol use: Yes    Comment: rare   Drug use: No   Sexual activity: Not on file  Other Topics Concern  Not on file  Social History Narrative   Lives at home   Social Drivers of Health   Financial Resource Strain: Low Risk  (12/20/2023)   Overall Financial Resource Strain (CARDIA)    Difficulty of Paying Living Expenses: Not hard at all  Food Insecurity: No Food Insecurity (12/20/2023)   Hunger Vital Sign    Worried About Running Out of Food in the Last Year: Never true    Ran Out of Food in the Last Year: Never true  Transportation Needs: No Transportation Needs (12/20/2023)   PRAPARE - Administrator, Civil Service (Medical): No     Lack of Transportation (Non-Medical): No  Physical Activity: Insufficiently Active (12/20/2023)   Exercise Vital Sign    Days of Exercise per Week: 2 days    Minutes of Exercise per Session: 30 min  Stress: Stress Concern Present (12/20/2023)   [employer] of Occupational Health - Occupational Stress Questionnaire    Feeling of Stress : To some extent  Social Connections: Socially Integrated (12/20/2023)   Social Connection and Isolation Panel [NHANES]    Frequency of Communication with Friends and Family: More than three times a week    Frequency of Social Gatherings with Friends and Family: Once a week    Attends Religious Services: More than 4 times per year    Active Member of [employer] or Organizations: Yes    Attends Engineer, structural: More than 4 times per year    Marital Status: Married     Review of Systems  Constitutional:  Negative for appetite change and unexpected weight change.  HENT:  Negative for congestion and sinus pressure.   Respiratory:  Negative for cough, chest tightness and shortness of breath.   Cardiovascular:  Negative for chest pain, palpitations and leg swelling.  Gastrointestinal:  Negative for abdominal pain, diarrhea, nausea and vomiting.  Genitourinary:  Negative for difficulty urinating and dysuria.  Musculoskeletal:  Negative for joint swelling and myalgias.  Skin:  Negative for color change and rash.  Neurological:  Negative for dizziness and headaches.  Psychiatric/Behavioral:  Negative for agitation and dysphoric mood.        Objective:     BP 116/70   Pulse 90   Temp 98 F (36.7 C)   Resp 16   Ht 5\' 5"  (1.651 m)   Wt 187 lb 6.4 oz (85 kg)   SpO2 99%   BMI 31.18 kg/m  Wt Readings from Last 3 Encounters:  12/22/23 187 lb 6.4 oz (85 kg)  10/29/23 184 lb (83.5 kg)  10/18/23 185 lb 3 oz (84 kg)    Physical Exam Vitals reviewed.  Constitutional:      General: She is not in acute distress.    Appearance: Normal  appearance.  HENT:     Head: Normocephalic and atraumatic.     Right Ear: External ear normal.     Left Ear: External ear normal.     Mouth/Throat:     Pharynx: No oropharyngeal exudate or posterior oropharyngeal erythema.  Eyes:     General: No scleral icterus.       Right eye: No discharge.        Left eye: No discharge.     Conjunctiva/sclera: Conjunctivae normal.  Neck:     Thyroid: No thyromegaly.  Cardiovascular:     Rate and Rhythm: Normal rate and regular rhythm.  Pulmonary:     Effort: No respiratory distress.     Breath sounds: Normal  breath sounds. No wheezing.  Abdominal:     General: Bowel sounds are normal.     Palpations: Abdomen is soft.     Tenderness: There is no abdominal tenderness.  Musculoskeletal:        General: No swelling or tenderness.     Cervical back: Neck supple. No tenderness.  Lymphadenopathy:     Cervical: No cervical adenopathy.  Skin:    Findings: No erythema or rash.  Neurological:     Mental Status: She is alert.  Psychiatric:        Mood and Affect: Mood normal.        Behavior: Behavior normal.         Outpatient Encounter Medications as of 12/22/2023  Medication Sig   albuterol (VENTOLIN HFA) 108 (90 Base) MCG/ACT inhaler Inhale 2 puffs into the lungs as needed.   cetirizine (ZYRTEC) 10 MG tablet Take 1 tablet by mouth at bedtime.   levonorgestrel (MIRENA) 20 MCG/DAY IUD 1 each by Intrauterine route once.   mometasone (ELOCON) 0.1 % cream Apply 1 application topically as needed.   montelukast (SINGULAIR) 10 MG tablet Take 1 tablet by mouth at bedtime.   [DISCONTINUED] buPROPion (WELLBUTRIN XL) 150 MG 24 hr tablet TAKE 1 TABLET BY MOUTH EVERY DAY   [DISCONTINUED] busPIRone (BUSPAR) 5 MG tablet TAKE 1 TABLET (5 MG TOTAL) BY MOUTH DAILY.   buPROPion (WELLBUTRIN XL) 150 MG 24 hr tablet Take 1 tablet (150 mg total) by mouth daily.   busPIRone (BUSPAR) 5 MG tablet Take 1 tablet (5 mg total) by mouth daily.   [DISCONTINUED]  oxyCODONE (ROXICODONE) 5 MG immediate release tablet Take 1 tablet (5 mg total) by mouth every 6 (six) hours as needed for up to 20 doses for severe pain (pain score 7-10).   No facility-administered encounter medications on file as of 12/22/2023.     Lab Results  Component Value Date   WBC 10.1 07/14/2023   HGB 12.8 07/14/2023   HCT 38.3 07/14/2023   PLT 249.0 07/14/2023   GLUCOSE 86 12/22/2023   CHOL 158 12/22/2023   TRIG 127.0 12/22/2023   HDL 51.10 12/22/2023   LDLCALC 81 12/22/2023   ALT 12 12/22/2023   AST 13 12/22/2023   NA 137 12/22/2023   K 4.0 12/22/2023   CL 103 12/22/2023   CREATININE 0.77 12/22/2023   BUN 15 12/22/2023   CO2 28 12/22/2023   TSH 2.64 05/28/2023       Assessment & Plan:  Stress Assessment & Plan: Increased stress and anxiety as outlined.  Discussed.  Continues on wellbutrin.  Continues on buspar.  Follow closely.     Hypercholesterolemia -     Lipid panel -     Comprehensive metabolic panel  Weight loss counseling, encounter for Assessment & Plan:  Insurance - does not cover wegovy. Discussed insulin resistance.  Discussed metformin and weight management referral. Check insulin level.  Low carb diet and exercise.  Follow.   Orders: -     Insulin, random  Fat necrosis (segmental) of breast Assessment & Plan: Is s/p bilateral breast reduction in conjunction with left breast biopsy with radiofrequency localization by Dr. Ulice Bold with Dr. Hazle Quant on 01/27/2023. Postoperatively she developed a wound to the left breast and underwent excision and primary closure on 04/14/2023. Is now s/p right breast mastopexy with left scar contracture excision and fat grafting from abdomen performed 10/18/2023. Doing well. Healing well. No increased pain.    Increased heart rate Assessment & Plan:  Previously noted per her report after surgery and at surgery visits. Denies any increased heart rate or palpitations.  Pulse ok today on exam. Discussed further  evaluation. Prefers to follow.     Other orders -     buPROPion HCl ER (XL); Take 1 tablet (150 mg total) by mouth daily.  Dispense: 90 tablet; Refill: 1 -     busPIRone HCl; Take 1 tablet (5 mg total) by mouth daily.  Dispense: 90 tablet; Refill: 1     Dale [place], MD

## 2023-12-22 NOTE — Assessment & Plan Note (Signed)
Is s/p bilateral breast reduction in conjunction with left breast biopsy with radiofrequency localization by Dr. Ulice Bold with Dr. Hazle Quant on 01/27/2023. Postoperatively she developed a wound to the left breast and underwent excision and primary closure on 04/14/2023. Is now s/p right breast mastopexy with left scar contracture excision and fat grafting from abdomen performed 10/18/2023. Doing well. Healing well. No increased pain.

## 2023-12-23 LAB — INSULIN, RANDOM: Insulin: 24.4 u[IU]/mL — ABNORMAL HIGH

## 2023-12-27 ENCOUNTER — Other Ambulatory Visit: Payer: Self-pay

## 2023-12-27 MED ORDER — METFORMIN HCL ER 500 MG PO TB24: 500.0000 mg | ORAL_TABLET | Freq: Every day | ORAL | 2 refills | Status: DC

## 2024-01-17 ENCOUNTER — Ambulatory Visit
Admission: RE | Admit: 2024-01-17 | Discharge: 2024-01-17 | Disposition: A | Discharge status: Home / Self Care | Payer: 59 | Source: Ambulatory Visit | Attending: General Surgery | Admitting: General Surgery

## 2024-01-17 DIAGNOSIS — Z1231 Encounter for screening mammogram for malignant neoplasm of breast: Secondary | ICD-10-CM | POA: Diagnosis present

## 2024-03-11 ENCOUNTER — Telehealth: Admitting: Family Medicine

## 2024-03-11 DIAGNOSIS — J019 Acute sinusitis, unspecified: Secondary | ICD-10-CM | POA: Diagnosis not present

## 2024-03-11 MED ORDER — FLUTICASONE PROPIONATE 50 MCG/ACT NA SUSP: 2.0000 | Freq: Every day | NASAL | 0 refills | Status: AC

## 2024-03-11 MED ORDER — DOXYCYCLINE HYCLATE 100 MG PO TABS: 100.0000 mg | ORAL_TABLET | Freq: Two times a day (BID) | ORAL | 0 refills | Status: AC

## 2024-03-11 NOTE — Progress Notes (Signed)
 Virtual Visit Consent   Tara Davila, you are scheduled for a virtual visit with a [facility] provider today. Just as with appointments in the office, your consent must be obtained to participate. Your consent will be active for this visit and any virtual visit you may have with one of our providers in the next 365 days. If you have a MyChart account, a copy of this consent can be sent to you electronically.  As this is a virtual visit, video technology does not allow for your provider to perform a traditional examination. This may limit your provider's ability to fully assess your condition. If your provider identifies any concerns that need to be evaluated in person or the need to arrange testing (such as labs, EKG, etc.), we will make arrangements to do so. Although advances in technology are sophisticated, we cannot ensure that it will always work on either your end or our end. If the connection with a video visit is poor, the visit may have to be switched to a telephone visit. With either a video or telephone visit, we are not always able to ensure that we have a secure connection.  By engaging in this virtual visit, you consent to the provision of healthcare and authorize for your insurance to be billed (if applicable) for the services provided during this visit. Depending on your insurance coverage, you may receive a charge related to this service.  I need to obtain your verbal consent now. Are you willing to proceed with your visit today? Providencia L Job has provided verbal consent on 03/11/2024 for a virtual visit (video or telephone). Tara Davila, [place]  Date: 03/11/2024 3:01 PM   Virtual Visit via Video Note   I, Tara Davila, connected with  TASHEA OTHMAN  ([id number], [birth year]) on 03/11/24 at  3:00 PM EDT by a video-enabled telemedicine application and verified that I am speaking with the correct person using two identifiers.  Location: Patient: Virtual Visit Location  Patient: Home Provider: Virtual Visit Location Provider: Home Office   I discussed the limitations of evaluation and management by telemedicine and the availability of in person appointments. The patient expressed understanding and agreed to proceed.    History of Present Illness: Tara Davila is a 43 y.o. who identifies as a female who was assigned female at birth, and is being seen today for c/o a cold for a while and states she was getting better but now has a fever.  Pt states her song became sick first and tested negative for Flu and Covid and a week later she became sick.  Pt states symptoms started April 7th. Pt states her fever was 99.5 and now she is 102.  Pt states she has a cough and congestion mostly nasal and throat.  Pt states she has headache as well. Pt states when she coughs she can't seem to get it out.  Pt states taking mucinex mostly.   HPI: HPI  Problems:  Patient Active Problem List   Diagnosis Date Noted   Increased heart rate 12/22/2023   Fat necrosis (segmental) of breast 09/21/2023   Breast asymmetry in female 09/21/2023   Cough 11/01/2022   Weight loss counseling, encounter for 08/09/2022   Fat necrosis of breast 07/31/2022   Back pain 07/31/2022   Great toe pain 08/10/2021   BMI 29.0-29.9,adult 04/21/2021   Weight gain 10/12/2020   Abnormal CXR 10/12/2020   Breast nodule 08/18/2020   Hives 01/30/2020   Irritable bowel syndrome with  both constipation and diarrhea 01/30/2020   Abnormal mammogram 01/08/2020   Nasal congestion 01/07/2020   Difficulty sleeping 05/31/2016   Health care maintenance 03/31/2015   Soft tissue mass 07/27/2014   Gluteal pain 06/27/2014   Stress 06/27/2014   Uses birth control 06/27/2014    Allergies:  Allergies  Allergen Reactions   Wound Dressing Adhesive Itching, Dermatitis and Rash    Dermabond (skin glue)   Medications:  Current Outpatient Medications:    doxycycline  (VIBRA -TABS) 100 MG tablet, Take 1 tablet (100  mg total) by mouth 2 (two) times daily for 7 days., Disp: 14 tablet, Rfl: 0   fluticasone  (FLONASE ) 50 MCG/ACT nasal spray, Place 2 sprays into both nostrils daily., Disp: 16 g, Rfl: 0   albuterol  (VENTOLIN  HFA) 108 (90 Base) MCG/ACT inhaler, Inhale 2 puffs into the lungs as needed., Disp: , Rfl:    buPROPion  (WELLBUTRIN  XL) 150 MG 24 hr tablet, Take 1 tablet (150 mg total) by mouth daily., Disp: 90 tablet, Rfl: 1   busPIRone  (BUSPAR ) 5 MG tablet, Take 1 tablet (5 mg total) by mouth daily., Disp: 90 tablet, Rfl: 1   cetirizine (ZYRTEC) 10 MG tablet, Take 1 tablet by mouth at bedtime., Disp: , Rfl:    levonorgestrel  (MIRENA ) 20 MCG/DAY IUD, 1 each by Intrauterine route once., Disp: , Rfl:    metFORMIN  (GLUCOPHAGE -XR) 500 MG 24 hr tablet, Take 1 tablet (500 mg total) by mouth daily with breakfast., Disp: 30 tablet, Rfl: 2   mometasone (ELOCON) 0.1 % cream, Apply 1 application topically as needed., Disp: , Rfl:    montelukast (SINGULAIR) 10 MG tablet, Take 1 tablet by mouth at bedtime., Disp: , Rfl:   Observations/Objective: Patient is well-developed, well-nourished in no acute distress.  Resting comfortably at home.  Head is normocephalic, atraumatic.  No labored breathing.  Speech is clear and coherent with logical content.  Patient is alert and oriented at baseline.    Assessment and Plan: 1. Acute non-recurrent sinusitis, unspecified location (Primary) - doxycycline  (VIBRA -TABS) 100 MG tablet; Take 1 tablet (100 mg total) by mouth 2 (two) times daily for 7 days.  Dispense: 14 tablet; Refill: 0 - fluticasone  (FLONASE ) 50 MCG/ACT nasal spray; Place 2 sprays into both nostrils daily.  Dispense: 16 g; Refill: 0  -Will treat for a sinus infection  -Advised Pt to F/U with in person urgent care or PCP for worsening symptoms   Follow Up Instructions: I discussed the assessment and treatment plan with the patient. The patient was provided an opportunity to ask questions and all were answered.  The patient agreed with the plan and demonstrated an understanding of the instructions.  A copy of instructions were sent to the patient via MyChart unless otherwise noted below.    The patient was advised to call back or seek an in-person evaluation if the symptoms worsen or if the condition fails to improve as anticipated.    Tara Roys, PA-C

## 2024-03-11 NOTE — Patient Instructions (Signed)
 Tara Davila, thank you for joining Louvenia Roys, PA-C for today's virtual visit.  While this provider is not your primary care provider (PCP), if your PCP is located in our provider database this encounter information will be shared with them immediately following your visit.   A [facility] MyChart account gives you access to today's visit and all your visits, tests, and labs performed at [facility] " click here if you don't have a [facility] MyChart account or go to [email or web address]  Consent: (Patient) Tara Davila provided verbal consent for this virtual visit at the beginning of the encounter.  Current Medications:  Current Outpatient Medications:    doxycycline  (VIBRA -TABS) 100 MG tablet, Take 1 tablet (100 mg total) by mouth 2 (two) times daily for 7 days., Disp: 14 tablet, Rfl: 0   fluticasone  (FLONASE ) 50 MCG/ACT nasal spray, Place 2 sprays into both nostrils daily., Disp: 16 g, Rfl: 0   albuterol  (VENTOLIN  HFA) 108 (90 Base) MCG/ACT inhaler, Inhale 2 puffs into the lungs as needed., Disp: , Rfl:    buPROPion  (WELLBUTRIN  XL) 150 MG 24 hr tablet, Take 1 tablet (150 mg total) by mouth daily., Disp: 90 tablet, Rfl: 1   busPIRone  (BUSPAR ) 5 MG tablet, Take 1 tablet (5 mg total) by mouth daily., Disp: 90 tablet, Rfl: 1   cetirizine (ZYRTEC) 10 MG tablet, Take 1 tablet by mouth at bedtime., Disp: , Rfl:    levonorgestrel  (MIRENA ) 20 MCG/DAY IUD, 1 each by Intrauterine route once., Disp: , Rfl:    metFORMIN  (GLUCOPHAGE -XR) 500 MG 24 hr tablet, Take 1 tablet (500 mg total) by mouth daily with breakfast., Disp: 30 tablet, Rfl: 2   mometasone (ELOCON) 0.1 % cream, Apply 1 application topically as needed., Disp: , Rfl:    montelukast (SINGULAIR) 10 MG tablet, Take 1 tablet by mouth at bedtime., Disp: , Rfl:    Medications ordered in this encounter:  Meds ordered this encounter  Medications   doxycycline  (VIBRA -TABS) 100 MG tablet    Sig: Take 1 tablet (100  mg total) by mouth 2 (two) times daily for 7 days.    Dispense:  14 tablet    Refill:  0   fluticasone  (FLONASE ) 50 MCG/ACT nasal spray    Sig: Place 2 sprays into both nostrils daily.    Dispense:  16 g    Refill:  0     *If you need refills on other medications prior to your next appointment, please contact your pharmacy*  Follow-Up: Call back or seek an in-person evaluation if the symptoms worsen or if the condition fails to improve as anticipated.  [facility] [phone]  Other Instructions Sinus Infection, Adult A sinus infection, also called sinusitis, is inflammation of your sinuses. Sinuses are hollow spaces in the bones around your face. Your sinuses are located: Around your eyes. In the middle of your forehead. Behind your nose. In your cheekbones. Mucus normally drains out of your sinuses. When your nasal tissues become inflamed or swollen, mucus can become trapped or blocked. This allows bacteria, viruses, and fungi to grow, which leads to infection. Most infections of the sinuses are caused by a virus. A sinus infection can develop quickly. It can last for up to 4 weeks (acute) or for more than 12 weeks (chronic). A sinus infection often develops after a cold. What are the causes? This condition is caused by anything that creates swelling in the sinuses or stops mucus from draining. This includes: Allergies.  Asthma. Infection from bacteria or viruses. Deformities or blockages in your nose or sinuses. Abnormal growths in the nose (nasal polyps). Pollutants, such as chemicals or irritants in the air. Infection from fungi. This is rare. What increases the risk? You are more likely to develop this condition if you: Have a weak body defense system (immune system). Do a lot of swimming or diving. Overuse nasal sprays. Smoke. What are the signs or symptoms? The main symptoms of this condition are pain and a feeling of pressure around the affected  sinuses. Other symptoms include: Stuffy nose or congestion that makes it difficult to breathe through your nose. Thick yellow or greenish drainage from your nose. Tenderness, swelling, and warmth over the affected sinuses. A cough that may get worse at night. Decreased sense of smell and taste. Extra mucus that collects in the throat or the back of the nose (postnasal drip) causing a sore throat or bad breath. Tiredness (fatigue). Fever. How is this diagnosed? This condition is diagnosed based on: Your symptoms. Your medical history. A physical exam. Tests to find out if your condition is acute or chronic. This may include: Checking your nose for nasal polyps. Viewing your sinuses using a device that has a light (endoscope). Testing for allergies or bacteria. Imaging tests, such as an MRI or CT scan. In rare cases, a bone biopsy may be done to rule out more serious types of fungal sinus disease. How is this treated? Treatment for a sinus infection depends on the cause and whether your condition is chronic or acute. If caused by a virus, your symptoms should go away on their own within 10 days. You may be given medicines to relieve symptoms. They include: Medicines that shrink swollen nasal passages (decongestants). A spray that eases inflammation of the nostrils (topical intranasal corticosteroids). Rinses that help get rid of thick mucus in your nose (nasal saline washes). Medicines that treat allergies (antihistamines). Over-the-counter pain relievers. If caused by bacteria, your health care provider may recommend waiting to see if your symptoms improve. Most bacterial infections will get better without antibiotic medicine. You may be given antibiotics if you have: A severe infection. A weak immune system. If caused by narrow nasal passages or nasal polyps, surgery may be needed. Follow these instructions at home: Medicines Take, use, or apply over-the-counter and prescription  medicines only as told by your health care provider. These may include nasal sprays. If you were prescribed an antibiotic medicine, take it as told by your health care provider. Do not stop taking the antibiotic even if you start to feel better. Hydrate and humidify  Drink enough fluid to keep your urine pale yellow. Staying hydrated will help to thin your mucus. Use a cool mist humidifier to keep the humidity level in your home above 50%. Inhale steam for 10-15 minutes, 3-4 times a day, or as told by your health care provider. You can do this in the bathroom while a hot shower is running. Limit your exposure to cool or dry air. Rest Rest as much as possible. Sleep with your head raised (elevated). Make sure you get enough sleep each night. General instructions  Apply a warm, moist washcloth to your face 3-4 times a day or as told by your health care provider. This will help with discomfort. Use nasal saline washes as often as told by your health care provider. Wash your hands often with soap and water  to reduce your exposure to germs. If soap and water  are not available,  use hand sanitizer. Do not smoke. Avoid being around people who are smoking (secondhand smoke). Keep all follow-up visits. This is important. Contact a health care provider if: You have a fever. Your symptoms get worse. Your symptoms do not improve within 10 days. Get help right away if: You have a severe headache. You have persistent vomiting. You have severe pain or swelling around your face or eyes. You have vision problems. You develop confusion. Your neck is stiff. You have trouble breathing. These symptoms may be an emergency. Get help right away. Call 911. Do not wait to see if the symptoms will go away. Do not drive yourself to the hospital. Summary A sinus infection is soreness and inflammation of your sinuses. Sinuses are hollow spaces in the bones around your face. This condition is caused by nasal  tissues that become inflamed or swollen. The swelling traps or blocks the flow of mucus. This allows bacteria, viruses, and fungi to grow, which leads to infection. If you were prescribed an antibiotic medicine, take it as told by your health care provider. Do not stop taking the antibiotic even if you start to feel better. Keep all follow-up visits. This is important. This information is not intended to replace advice given to you by your health care provider. Make sure you discuss any questions you have with your health care provider. Document Revised: 10/14/2021 Document Reviewed: 10/14/2021 Elsevier Patient Education  2024 Elsevier Inc.   If you have been instructed to have an in-person evaluation today at a local Urgent Care facility, please use the link below. It will take you to a list of all of our available [facility], including address, phone number and hours of operation. Please do not delay care.  [facility]  If you or a family member do not have a primary care provider, use the link below to schedule a visit and establish care. When you choose a [facility] primary care physician or advanced practice provider, you gain a long-term partner in health. Find a Primary Care Provider  Learn more about [facility]'s in-office and virtual care options: [facility] Get Care Now

## 2024-03-21 ENCOUNTER — Encounter: Payer: Self-pay | Admitting: Internal Medicine

## 2024-03-21 ENCOUNTER — Ambulatory Visit: Payer: 59 | Admitting: Internal Medicine

## 2024-03-21 VITALS — BP 112/74 | HR 90 | Temp 97.9°F | Resp 16 | Ht 65.0 in | Wt 189.6 lb

## 2024-03-21 DIAGNOSIS — R0981 Nasal congestion: Secondary | ICD-10-CM | POA: Diagnosis not present

## 2024-03-21 DIAGNOSIS — F439 Reaction to severe stress, unspecified: Secondary | ICD-10-CM

## 2024-03-21 MED ORDER — BUPROPION HCL ER (XL) 150 MG PO TB24: 150.0000 mg | ORAL_TABLET | Freq: Every day | ORAL | 1 refills | Status: DC

## 2024-03-21 MED ORDER — METFORMIN HCL ER 500 MG PO TB24: 500.0000 mg | ORAL_TABLET | Freq: Every day | ORAL | 2 refills | Status: DC

## 2024-03-21 MED ORDER — BUSPIRONE HCL 5 MG PO TABS: 5.0000 mg | ORAL_TABLET | Freq: Every day | ORAL | 1 refills | Status: DC

## 2024-03-21 NOTE — Assessment & Plan Note (Signed)
 Recently treated for sinus infection with doxycycline . Just completed abx. Symptoms improved. No sob. Continue zyrtec, neti pot, singulair and flonase . Follow.

## 2024-03-21 NOTE — Assessment & Plan Note (Signed)
 Overall doing well on wellbutrin  and buspar . Stable. No changes in medication. Follow

## 2024-03-21 NOTE — Progress Notes (Signed)
 Subjective:    Patient ID: Tara Davila, female    DOB: 13-Mar-1981, 43 y.o.   MRN: [id number]  Patient here for  Chief Complaint  Patient presents with   Medical Management of Chronic Issues    HPI Here for a scheduled follow up. Is s/p bilateral breast reduction in conjunction with left breast biopsy with radiofrequency localization by Dr. Orin Birk with Dr. Dortha Gauss on 01/27/2023. Postoperatively she developed a wound to the left breast and underwent excision and primary closure on 04/14/2023. Is now s/p right breast mastopexy with left scar contracture excision and fat grafting from abdomen performed 10/18/2023. Had f/u with Dr Charmel Cooter 01/25/24 - stable. Recommended to continue yearly bilateral screening mammogram. Continues on buspar  and wellbutrin  - for increased stress. Saw Dr Almeda Jacobs 01/2024 - recommended singulair, zyrtec, nasonex and albuterol . Was evaluated 03/11/24 - diagnosed with sinus infection. Treated with doxycycline . Just completed her abx. Symptoms have improved. Not requiring her inhaler now. No sob. Continues on flonase , singulair and zyrtec. No acid reflux. No abdominal pain or bowel change.  Handling stress. Medication working well.    Past Medical History:  Diagnosis Date   Allergy    Anemia    Asthma    well controlled   GERD (gastroesophageal reflux disease)    occ   History of chicken pox    Hx: UTI (urinary tract infection)    Past Surgical History:  Procedure Laterality Date   BREAST BIOPSY Left 12/24/2021   left breast stereo x clip neg   BREAST BIOPSY WITH RADIO FREQUENCY LOCALIZER Left 01/27/2023   BREAST BIOPSY WITH RADIO FREQUENCY LOCALIZER;fibroepithelial lesion  Surgeon: Eldred Grego, MD;  Location: [facility];  Service: General;  Laterality: Left;   BREAST CYST EXCISION Left 10/18/2023   Procedure: Excision of left breast lesion;  Surgeon: Thornell Flirt, DO;  Location: [facility];  Service: Plastics;  Laterality:  Left;   BREAST REDUCTION SURGERY Bilateral 01/27/2023   Procedure: MAMMARY REDUCTION  (BREAST);  Surgeon: Thornell Flirt, DO;  Location: [facility];  Service: Plastics;  Laterality: Bilateral;   LIPOSUCTION WITH LIPOFILLING Left 10/18/2023   Procedure: fat necrosis with fat grafting;  Surgeon: Thornell Flirt, DO;  Location: [facility];  Service: Plastics;  Laterality: Left;   WISDOM TOOTH EXTRACTION     Family History  Problem Relation Age of Onset   Hyperlipidemia Mother    Hypertension Father    Cancer Maternal Grandmother        breast   Breast cancer Maternal Grandmother 16   Hyperlipidemia Maternal Grandfather    Heart disease Maternal Grandfather    Hypertension Maternal Grandfather    Stroke Paternal Grandmother    Breast cancer Paternal Aunt    Social History   Socioeconomic History   Marital status: Married    Spouse name: Not on file   Number of children: Not on file   Years of education: Not on file   Highest education level: Bachelor's degree (e.g., BA, AB, BS)  Occupational History   Not on file  Tobacco Use   Smoking status: Never   Smokeless tobacco: Never  Vaping Use   Vaping status: Never Used  Substance and Sexual Activity   Alcohol use: Yes    Comment: rare   Drug use: No   Sexual activity: Not on file  Other Topics Concern   Not on file  Social History Narrative   Lives at home   Social Drivers of Health  Financial Resource Strain: Low Risk  (12/20/2023)   Overall Financial Resource Strain (CARDIA)    Difficulty of Paying Living Expenses: Not hard at all  Food Insecurity: No Food Insecurity (12/20/2023)   Hunger Vital Sign    Worried About Running Out of Food in the Last Year: Never true    Ran Out of Food in the Last Year: Never true  Transportation Needs: No Transportation Needs (12/20/2023)   PRAPARE - Administrator, Civil Service (Medical): No    Lack of Transportation (Non-Medical): No  Physical  Activity: Insufficiently Active (12/20/2023)   Exercise Vital Sign    Days of Exercise per Week: 2 days    Minutes of Exercise per Session: 30 min  Stress: Stress Concern Present (12/20/2023)   [employer] of Occupational Health - Occupational Stress Questionnaire    Feeling of Stress : To some extent  Social Connections: Socially Integrated (12/20/2023)   Social Connection and Isolation Panel [NHANES]    Frequency of Communication with Friends and Family: More than three times a week    Frequency of Social Gatherings with Friends and Family: Once a week    Attends Religious Services: More than 4 times per year    Active Member of [employer] or Organizations: Yes    Attends Engineer, structural: More than 4 times per year    Marital Status: Married     Review of Systems  Constitutional:  Negative for appetite change and unexpected weight change.  HENT:  Negative for sinus pressure.        Congestion improved.   Respiratory:  Negative for cough, chest tightness and shortness of breath.   Cardiovascular:  Negative for chest pain, palpitations and leg swelling.  Gastrointestinal:  Negative for abdominal pain, diarrhea, nausea and vomiting.  Genitourinary:  Negative for difficulty urinating and dysuria.  Musculoskeletal:  Negative for joint swelling and myalgias.  Skin:  Negative for color change and rash.  Neurological:  Negative for dizziness and headaches.  Psychiatric/Behavioral:  Negative for agitation and dysphoric mood.        Objective:     BP 112/74   Pulse 90   Temp 97.9 F (36.6 C)   Resp 16   Ht 5\' 5"  (1.651 m)   Wt 189 lb 9.6 oz (86 kg)   SpO2 99%   BMI 31.55 kg/m  Wt Readings from Last 3 Encounters:  03/21/24 189 lb 9.6 oz (86 kg)  12/22/23 187 lb 6.4 oz (85 kg)  10/29/23 184 lb (83.5 kg)    Physical Exam Vitals reviewed.  Constitutional:      General: She is not in acute distress.    Appearance: Normal appearance.  HENT:     Head:  Normocephalic and atraumatic.     Right Ear: External ear normal.     Left Ear: External ear normal.     Mouth/Throat:     Pharynx: No oropharyngeal exudate or posterior oropharyngeal erythema.  Eyes:     General: No scleral icterus.       Right eye: No discharge.        Left eye: No discharge.     Conjunctiva/sclera: Conjunctivae normal.  Neck:     Thyroid : No thyromegaly.  Cardiovascular:     Rate and Rhythm: Normal rate and regular rhythm.  Pulmonary:     Effort: No respiratory distress.     Breath sounds: Normal breath sounds. No wheezing.  Abdominal:     General: Bowel sounds  are normal.     Palpations: Abdomen is soft.     Tenderness: There is no abdominal tenderness.  Musculoskeletal:        General: No swelling or tenderness.     Cervical back: Neck supple. No tenderness.  Lymphadenopathy:     Cervical: No cervical adenopathy.  Skin:    Findings: No erythema or rash.  Neurological:     Mental Status: She is alert.  Psychiatric:        Mood and Affect: Mood normal.        Behavior: Behavior normal.         Outpatient Encounter Medications as of 03/21/2024  Medication Sig   albuterol  (VENTOLIN  HFA) 108 (90 Base) MCG/ACT inhaler Inhale 2 puffs into the lungs as needed.   buPROPion  (WELLBUTRIN  XL) 150 MG 24 hr tablet Take 1 tablet (150 mg total) by mouth daily.   busPIRone  (BUSPAR ) 5 MG tablet Take 1 tablet (5 mg total) by mouth daily.   cetirizine (ZYRTEC) 10 MG tablet Take 1 tablet by mouth at bedtime.   fluticasone  (FLONASE ) 50 MCG/ACT nasal spray Place 2 sprays into both nostrils daily.   levonorgestrel  (MIRENA ) 20 MCG/DAY IUD 1 each by Intrauterine route once.   metFORMIN  (GLUCOPHAGE -XR) 500 MG 24 hr tablet Take 1 tablet (500 mg total) by mouth daily with breakfast.   mometasone (ELOCON) 0.1 % cream Apply 1 application topically as needed.   montelukast (SINGULAIR) 10 MG tablet Take 1 tablet by mouth at bedtime.   [DISCONTINUED] buPROPion  (WELLBUTRIN  XL) 150  MG 24 hr tablet Take 1 tablet (150 mg total) by mouth daily.   [DISCONTINUED] busPIRone  (BUSPAR ) 5 MG tablet Take 1 tablet (5 mg total) by mouth daily.   [DISCONTINUED] metFORMIN  (GLUCOPHAGE -XR) 500 MG 24 hr tablet Take 1 tablet (500 mg total) by mouth daily with breakfast.   No facility-administered encounter medications on file as of 03/21/2024.     Lab Results  Component Value Date   WBC 10.1 07/14/2023   HGB 12.8 07/14/2023   HCT 38.3 07/14/2023   PLT 249.0 07/14/2023   GLUCOSE 86 12/22/2023   CHOL 158 12/22/2023   TRIG 127.0 12/22/2023   HDL 51.10 12/22/2023   LDLCALC 81 12/22/2023   ALT 12 12/22/2023   AST 13 12/22/2023   NA 137 12/22/2023   K 4.0 12/22/2023   CL 103 12/22/2023   CREATININE 0.77 12/22/2023   BUN 15 12/22/2023   CO2 28 12/22/2023   TSH 2.64 05/28/2023    MM 3D SCREENING MAMMOGRAM BILATERAL BREAST Result Date: 01/20/2024 CLINICAL DATA:  Screening. This is the patient's initial mammogram after BILATERAL reduction mammoplasty in March, 2024. EXAM: DIGITAL SCREENING BILATERAL MAMMOGRAM WITH TOMOSYNTHESIS AND CAD TECHNIQUE: Bilateral screening digital craniocaudal and mediolateral oblique mammograms were obtained. Bilateral screening digital breast tomosynthesis was performed. The images were evaluated with computer-aided detection. COMPARISON:  Previous exam(s). ACR Breast Density Category c: The breasts are heterogeneously dense, which may obscure small masses. FINDINGS: There are no findings suspicious for malignancy. Post surgical changes in both breasts related to the interval reduction mammoplasty, including a benign fat-containing mass in the lower retroareolar LEFT breast related to the prior lipofilling procedure. IMPRESSION: No mammographic evidence of malignancy. A result letter of this screening mammogram will be mailed directly to the patient. RECOMMENDATION: Screening mammogram in one year. (Code:SM-B-01Y) BI-RADS CATEGORY  2: Benign. Electronically Signed    By: Rinda Cheers M.D.   On: 01/20/2024 08:28  Assessment & Plan:  Stress Assessment & Plan: Overall doing well on wellbutrin  and buspar . Stable. No changes in medication. Follow    Nasal congestion Assessment & Plan: Recently treated for sinus infection with doxycycline . Just completed abx. Symptoms improved. No sob. Continue zyrtec, neti pot, singulair and flonase . Follow.     Other orders -     buPROPion  HCl ER (XL); Take 1 tablet (150 mg total) by mouth daily.  Dispense: 90 tablet; Refill: 1 -     busPIRone  HCl; Take 1 tablet (5 mg total) by mouth daily.  Dispense: 90 tablet; Refill: 1 -     metFORMIN  HCl ER; Take 1 tablet (500 mg total) by mouth daily with breakfast.  Dispense: 30 tablet; Refill: 2     Dellar Fenton, MD

## 2024-06-21 ENCOUNTER — Ambulatory Visit (INDEPENDENT_AMBULATORY_CARE_PROVIDER_SITE_OTHER): Admitting: Internal Medicine

## 2024-06-21 ENCOUNTER — Encounter: Payer: Self-pay | Admitting: Internal Medicine

## 2024-06-21 ENCOUNTER — Ambulatory Visit: Payer: Self-pay | Admitting: Internal Medicine

## 2024-06-21 VITALS — BP 116/68 | HR 90 | Resp 16 | Ht 65.0 in | Wt 189.0 lb

## 2024-06-21 DIAGNOSIS — Z13220 Encounter for screening for lipoid disorders: Secondary | ICD-10-CM

## 2024-06-21 DIAGNOSIS — R635 Abnormal weight gain: Secondary | ICD-10-CM | POA: Diagnosis not present

## 2024-06-21 DIAGNOSIS — Z0000 Encounter for general adult medical examination without abnormal findings: Secondary | ICD-10-CM | POA: Diagnosis not present

## 2024-06-21 DIAGNOSIS — Z975 Presence of (intrauterine) contraceptive device: Secondary | ICD-10-CM | POA: Diagnosis not present

## 2024-06-21 DIAGNOSIS — F439 Reaction to severe stress, unspecified: Secondary | ICD-10-CM

## 2024-06-21 DIAGNOSIS — K582 Mixed irritable bowel syndrome: Secondary | ICD-10-CM

## 2024-06-21 LAB — CBC WITH DIFFERENTIAL/PLATELET
Basophils Absolute: 0.1 K/uL (ref 0.0–0.1)
Basophils Relative: 0.7 % (ref 0.0–3.0)
Eosinophils Absolute: 0.3 K/uL (ref 0.0–0.7)
Eosinophils Relative: 2.1 % (ref 0.0–5.0)
HCT: 39.6 % (ref 36.0–46.0)
Hemoglobin: 12.8 g/dL (ref 12.0–15.0)
Lymphocytes Relative: 22.8 % (ref 12.0–46.0)
Lymphs Abs: 2.8 K/uL (ref 0.7–4.0)
MCHC: 32.3 g/dL (ref 30.0–36.0)
MCV: 89.7 fl (ref 78.0–100.0)
Monocytes Absolute: 0.9 K/uL (ref 0.1–1.0)
Monocytes Relative: 7.3 % (ref 3.0–12.0)
Neutro Abs: 8.2 K/uL — ABNORMAL HIGH (ref 1.4–7.7)
Neutrophils Relative %: 67.1 % (ref 43.0–77.0)
Platelets: 247 K/uL (ref 150.0–400.0)
RBC: 4.42 Mil/uL (ref 3.87–5.11)
RDW: 14.3 % (ref 11.5–15.5)
WBC: 12.3 K/uL — ABNORMAL HIGH (ref 4.0–10.5)

## 2024-06-21 LAB — LIPID PANEL
Cholesterol: 169 mg/dL (ref 0–200)
HDL: 47.3 mg/dL (ref >39.00)
LDL Cholesterol: 71 mg/dL (ref 0–99)
NonHDL: 121.44
Total CHOL/HDL Ratio: 4
Triglycerides: 250 mg/dL — ABNORMAL HIGH (ref 0.0–149.0)
VLDL: 50 mg/dL — ABNORMAL HIGH (ref 0.0–40.0)

## 2024-06-21 LAB — TSH: TSH: 4.11 u[IU]/mL (ref 0.35–5.50)

## 2024-06-21 MED ORDER — BUPROPION HCL ER (XL) 150 MG PO TB24: 150.0000 mg | ORAL_TABLET | Freq: Every day | ORAL | 1 refills | Status: DC

## 2024-06-21 MED ORDER — BUSPIRONE HCL 5 MG PO TABS: 5.0000 mg | ORAL_TABLET | Freq: Every day | ORAL | 1 refills | Status: DC

## 2024-06-21 NOTE — Assessment & Plan Note (Signed)
 Placed at [facility]. States due soon for change. Refer back to gyn for change IUD.

## 2024-06-21 NOTE — Assessment & Plan Note (Signed)
 Alternating diarrhea and constipation as outlined. Discussed trial of benefiber as directed. Call with update.

## 2024-06-21 NOTE — Assessment & Plan Note (Signed)
 Physical today 06/21/24.  04/23/22 - PAP - negative with negative HPV.  Mammogram as outlined. 01/20/24 - mammogram - Birads II.

## 2024-06-21 NOTE — Assessment & Plan Note (Signed)
 Overall doing well on wellbutrin  and buspar . Stable. No changes in medication. Follow

## 2024-06-21 NOTE — Progress Notes (Signed)
 Subjective:    Patient ID: Tara Davila, female    DOB: [birth year], 43 y.o.   MRN: [id number]  Patient here for  Chief Complaint  Patient presents with   Medical Management of Chronic Issues    HPI Here for a physical exam.  Is s/p bilateral breast reduction in conjunction with left breast biopsy with radiofrequency localization by Dr. Lowery with Dr. Rodolph on 01/27/2023. Postoperatively she developed a wound to the left breast and underwent excision and primary closure on 04/14/2023. Is now s/p right breast mastopexy with left scar contracture excision and fat grafting from abdomen performed 10/18/2023. Had f/u with Dr Cesar 01/25/24 - stable. Recommended to continue yearly bilateral screening mammogram. Continues on buspar  and wellbutrin  - for increased stress. Saw Dr Frutoso 01/2024 - recommended singulair, zyrtec, nasonex and albuterol . Reports she is doing relatively well. Overall handling stress well. Medication working for her. She does report some issues with intermittent loose stool and constipation. No vomiting. Some abdominal discomfort right before bowel movement, but will resolve after bm. Breathing stable. No vaginal problems. Has IUD. States is due to be changed.    Past Medical History:  Diagnosis Date   Allergy    Anemia    Asthma    well controlled   GERD (gastroesophageal reflux disease)    occ   History of chicken pox    Hx: UTI (urinary tract infection)    Past Surgical History:  Procedure Laterality Date   BREAST BIOPSY Left 12/24/2021   left breast stereo x clip neg   BREAST BIOPSY WITH RADIO FREQUENCY LOCALIZER Left 01/27/2023   BREAST BIOPSY WITH RADIO FREQUENCY LOCALIZER;fibroepithelial lesion  Surgeon: Rodolph Romano, MD;  Location: [facility];  Service: General;  Laterality: Left;   BREAST CYST EXCISION Left 10/18/2023   Procedure: Excision of left breast lesion;  Surgeon: Lowery Estefana RAMAN, DO;  Location: [facility];   Service: Plastics;  Laterality: Left;   BREAST REDUCTION SURGERY Bilateral 01/27/2023   Procedure: MAMMARY REDUCTION  (BREAST);  Surgeon: Lowery Estefana RAMAN, DO;  Location: [facility];  Service: Plastics;  Laterality: Bilateral;   LIPOSUCTION WITH LIPOFILLING Left 10/18/2023   Procedure: fat necrosis with fat grafting;  Surgeon: Lowery Estefana RAMAN, DO;  Location: [facility];  Service: Plastics;  Laterality: Left;   WISDOM TOOTH EXTRACTION     Family History  Problem Relation Age of Onset   Hyperlipidemia Mother    Hypertension Father    Cancer Maternal Grandmother        breast   Breast cancer Maternal Grandmother 46   Hyperlipidemia Maternal Grandfather    Heart disease Maternal Grandfather    Hypertension Maternal Grandfather    Stroke Paternal Grandmother    Breast cancer Paternal Aunt    Social History   Socioeconomic History   Marital status: Married    Spouse name: Not on file   Number of children: Not on file   Years of education: Not on file   Highest education level: Bachelor's degree (e.g., BA, AB, BS)  Occupational History   Not on file  Tobacco Use   Smoking status: Never   Smokeless tobacco: Never  Vaping Use   Vaping status: Never Used  Substance and Sexual Activity   Alcohol use: Yes    Comment: rare   Drug use: No   Sexual activity: Not on file  Other Topics Concern   Not on file  Social History Narrative   Lives at home  Social Drivers of Corporate investment banker Strain: Low Risk  (06/20/2024)   Overall Financial Resource Strain (CARDIA)    Difficulty of Paying Living Expenses: Not hard at all  Food Insecurity: No Food Insecurity (06/20/2024)   Hunger Vital Sign    Worried About Running Out of Food in the Last Year: Never true    Ran Out of Food in the Last Year: Never true  Transportation Needs: No Transportation Needs (06/20/2024)   PRAPARE - Administrator, Civil Service (Medical): No    Lack of Transportation  (Non-Medical): No  Physical Activity: Insufficiently Active (06/20/2024)   Exercise Vital Sign    Days of Exercise per Week: 3 days    Minutes of Exercise per Session: 30 min  Stress: Stress Concern Present (06/20/2024)   [employer] of Occupational Health - Occupational Stress Questionnaire    Feeling of Stress: To some extent  Social Connections: Socially Integrated (06/20/2024)   Social Connection and Isolation Panel    Frequency of Communication with Friends and Family: More than three times a week    Frequency of Social Gatherings with Friends and Family: Once a week    Attends Religious Services: More than 4 times per year    Active Member of [employer] or Organizations: Yes    Attends Engineer, structural: More than 4 times per year    Marital Status: Married     Review of Systems  Constitutional:  Negative for appetite change and unexpected weight change.  HENT:  Negative for congestion, sinus pressure and sore throat.   Eyes:  Negative for pain and visual disturbance.  Respiratory:  Negative for cough, chest tightness and shortness of breath.   Cardiovascular:  Negative for chest pain, palpitations and leg swelling.  Gastrointestinal:  Negative for abdominal pain and vomiting.       Alternating diarrhea and constipation.   Genitourinary:  Negative for difficulty urinating and dysuria.  Musculoskeletal:  Negative for joint swelling and myalgias.  Skin:  Negative for color change and rash.  Neurological:  Negative for dizziness and headaches.  Hematological:  Negative for adenopathy. Does not bruise/bleed easily.  Psychiatric/Behavioral:  Negative for agitation and dysphoric mood.        Objective:     BP 116/68   Pulse 90   Resp 16   Ht 5' 5 (1.651 m)   Wt 189 lb (85.7 kg)   SpO2 98%   BMI 31.45 kg/m  Wt Readings from Last 3 Encounters:  06/21/24 189 lb (85.7 kg)  03/21/24 189 lb 9.6 oz (86 kg)  12/22/23 187 lb 6.4 oz (85 kg)    Physical  Exam Vitals reviewed.  Constitutional:      General: She is not in acute distress.    Appearance: Normal appearance. She is well-developed.  HENT:     Head: Normocephalic and atraumatic.     Right Ear: External ear normal.     Left Ear: External ear normal.     Mouth/Throat:     Pharynx: No oropharyngeal exudate or posterior oropharyngeal erythema.  Eyes:     General: No scleral icterus.       Right eye: No discharge.        Left eye: No discharge.     Conjunctiva/sclera: Conjunctivae normal.  Neck:     Thyroid : No thyromegaly.  Cardiovascular:     Rate and Rhythm: Normal rate and regular rhythm.  Pulmonary:     Effort: No tachypnea,  accessory muscle usage or respiratory distress.     Breath sounds: Normal breath sounds. No decreased breath sounds or wheezing.  Chest:  Breasts:    Right: No inverted nipple, mass, nipple discharge or tenderness (no axillary adenopathy).     Left: No inverted nipple, mass, nipple discharge or tenderness (no axilarry adenopathy).  Abdominal:     General: Bowel sounds are normal.     Palpations: Abdomen is soft.     Tenderness: There is no abdominal tenderness.  Musculoskeletal:        General: No swelling or tenderness.     Cervical back: Neck supple.  Lymphadenopathy:     Cervical: No cervical adenopathy.  Skin:    Findings: No erythema or rash.  Neurological:     Mental Status: She is alert and oriented to person, place, and time.  Psychiatric:        Mood and Affect: Mood normal.        Behavior: Behavior normal.         Outpatient Encounter Medications as of 06/21/2024  Medication Sig   albuterol  (VENTOLIN  HFA) 108 (90 Base) MCG/ACT inhaler Inhale 2 puffs into the lungs as needed.   buPROPion  (WELLBUTRIN  XL) 150 MG 24 hr tablet Take 1 tablet (150 mg total) by mouth daily.   busPIRone  (BUSPAR ) 5 MG tablet Take 1 tablet (5 mg total) by mouth daily.   cetirizine (ZYRTEC) 10 MG tablet Take 1 tablet by mouth at bedtime.   fluticasone   (FLONASE ) 50 MCG/ACT nasal spray Place 2 sprays into both nostrils daily.   levonorgestrel  (MIRENA ) 20 MCG/DAY IUD 1 each by Intrauterine route once.   metFORMIN  (GLUCOPHAGE -XR) 500 MG 24 hr tablet Take 1 tablet (500 mg total) by mouth daily with breakfast.   mometasone (ELOCON) 0.1 % cream Apply 1 application topically as needed.   montelukast (SINGULAIR) 10 MG tablet Take 1 tablet by mouth at bedtime.   [DISCONTINUED] buPROPion  (WELLBUTRIN  XL) 150 MG 24 hr tablet Take 1 tablet (150 mg total) by mouth daily.   [DISCONTINUED] busPIRone  (BUSPAR ) 5 MG tablet Take 1 tablet (5 mg total) by mouth daily.   No facility-administered encounter medications on file as of 06/21/2024.     Lab Results  Component Value Date   WBC 10.1 07/14/2023   HGB 12.8 07/14/2023   HCT 38.3 07/14/2023   PLT 249.0 07/14/2023   GLUCOSE 86 12/22/2023   CHOL 158 12/22/2023   TRIG 127.0 12/22/2023   HDL 51.10 12/22/2023   LDLCALC 81 12/22/2023   ALT 12 12/22/2023   AST 13 12/22/2023   NA 137 12/22/2023   K 4.0 12/22/2023   CL 103 12/22/2023   CREATININE 0.77 12/22/2023   BUN 15 12/22/2023   CO2 28 12/22/2023   TSH 2.64 05/28/2023    MM 3D SCREENING MAMMOGRAM BILATERAL BREAST Result Date: 01/20/2024 CLINICAL DATA:  Screening. This is the patient's initial mammogram after BILATERAL reduction mammoplasty in March, 2024. EXAM: DIGITAL SCREENING BILATERAL MAMMOGRAM WITH TOMOSYNTHESIS AND CAD TECHNIQUE: Bilateral screening digital craniocaudal and mediolateral oblique mammograms were obtained. Bilateral screening digital breast tomosynthesis was performed. The images were evaluated with computer-aided detection. COMPARISON:  Previous exam(s). ACR Breast Density Category c: The breasts are heterogeneously dense, which may obscure small masses. FINDINGS: There are no findings suspicious for malignancy. Post surgical changes in both breasts related to the interval reduction mammoplasty, including a benign fat-containing mass  in the lower retroareolar LEFT breast related to the prior lipofilling procedure. IMPRESSION: No mammographic  evidence of malignancy. A result letter of this screening mammogram will be mailed directly to the patient. RECOMMENDATION: Screening mammogram in one year. (Code:SM-B-01Y) BI-RADS CATEGORY  2: Benign. Electronically Signed   By: Debby Satterfield M.D.   On: 01/20/2024 08:28       Assessment & Plan:  Routine general medical examination at a health care facility  Screening cholesterol level -     Lipid panel  Weight gain -     CBC with Differential/Platelet -     TSH -     COMPLETE METABOLIC PANEL WITHOUT GFR  Health care maintenance Assessment & Plan: Physical today 06/21/24.  04/23/22 - PAP - negative with negative HPV.  Mammogram as outlined. 01/20/24 - mammogram - Birads II.   IUD (intrauterine device) in place Assessment & Plan: [facility]. States due soon for change. Refer back to gyn for change IUD.   Orders: -     Ambulatory referral to Obstetrics / Gynecology  Irritable bowel syndrome with both constipation and diarrhea Assessment & Plan: Alternating diarrhea and constipation as outlined. Discussed trial of benefiber as directed. Call with update.    Stress Assessment & Plan: Overall doing well on wellbutrin  and buspar . Stable. No changes in medication. Follow.    Other orders -     buPROPion  HCl ER (XL); Take 1 tablet (150 mg total) by mouth daily.  Dispense: 90 tablet; Refill: 1 -     busPIRone  HCl; Take 1 tablet (5 mg total) by mouth daily.  Dispense: 90 tablet; Refill: 1     Allena Hamilton, MD

## 2024-06-22 LAB — COMPLETE METABOLIC PANEL WITHOUT GFR
AG Ratio: 1.6 (calc) (ref 1.0–2.5)
ALT: 12 U/L (ref 6–29)
AST: 13 U/L (ref 10–30)
Albumin: 4.4 g/dL (ref 3.6–5.1)
Alkaline phosphatase (APISO): 87 U/L (ref 31–125)
BUN: 12 mg/dL (ref 7–25)
CO2: 23 mmol/L (ref 20–32)
Calcium: 9.3 mg/dL (ref 8.6–10.2)
Chloride: 103 mmol/L (ref 98–110)
Creat: 0.79 mg/dL (ref 0.50–0.99)
Globulin: 2.7 g/dL (ref 1.9–3.7)
Glucose, Bld: 95 mg/dL (ref 65–99)
Potassium: 3.8 mmol/L (ref 3.5–5.3)
Sodium: 136 mmol/L (ref 135–146)
Total Bilirubin: 0.2 mg/dL (ref 0.2–1.2)
Total Protein: 7.1 g/dL (ref 6.1–8.1)

## 2024-06-28 ENCOUNTER — Other Ambulatory Visit: Payer: Self-pay | Admitting: Internal Medicine

## 2024-07-17 ENCOUNTER — Other Ambulatory Visit (INDEPENDENT_AMBULATORY_CARE_PROVIDER_SITE_OTHER)

## 2024-07-17 DIAGNOSIS — R635 Abnormal weight gain: Secondary | ICD-10-CM | POA: Diagnosis not present

## 2024-07-17 LAB — CBC WITH DIFFERENTIAL/PLATELET
Basophils Absolute: 0.1 K/uL (ref 0.0–0.1)
Basophils Relative: 0.8 % (ref 0.0–3.0)
Eosinophils Absolute: 0.4 K/uL (ref 0.0–0.7)
Eosinophils Relative: 3.9 % (ref 0.0–5.0)
HCT: 38.2 % (ref 36.0–46.0)
Hemoglobin: 12.5 g/dL (ref 12.0–15.0)
Lymphocytes Relative: 25.6 % (ref 12.0–46.0)
Lymphs Abs: 2.8 K/uL (ref 0.7–4.0)
MCHC: 32.8 g/dL (ref 30.0–36.0)
MCV: 89.6 fl (ref 78.0–100.0)
Monocytes Absolute: 0.9 K/uL (ref 0.1–1.0)
Monocytes Relative: 8 % (ref 3.0–12.0)
Neutro Abs: 6.9 K/uL (ref 1.4–7.7)
Neutrophils Relative %: 61.7 % (ref 43.0–77.0)
Platelets: 240 K/uL (ref 150.0–400.0)
RBC: 4.26 Mil/uL (ref 3.87–5.11)
RDW: 14.8 % (ref 11.5–15.5)
WBC: 11.1 K/uL — ABNORMAL HIGH (ref 4.0–10.5)

## 2024-07-18 ENCOUNTER — Ambulatory Visit: Payer: Self-pay | Admitting: Internal Medicine

## 2024-08-14 NOTE — Progress Notes (Unsigned)
 Tara Shad, MD   No chief complaint on file.   HPI:      [name] is a 43 y.o. G2P0010 whose LMP was No LMP recorded., presents today for NP> 3 yrs IUD check. 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   Back pain 07/31/2022   Great toe pain 08/10/2021   BMI 29.0-29.9,adult 04/21/2021   Weight gain 10/12/2020   Breast nodule 08/18/2020   Hives 01/30/2020   Irritable bowel syndrome with both constipation and diarrhea 01/30/2020   Abnormal mammogram 01/08/2020   Nasal congestion 01/07/2020   Difficulty sleeping 05/31/2016   Health care maintenance 03/31/2015   Soft tissue mass 07/27/2014   Gluteal pain 06/27/2014   Stress 06/27/2014   Uses birth control 06/27/2014    Past Surgical History:  Procedure Laterality Date   BREAST BIOPSY Left 12/24/2021   left breast stereo x clip neg   BREAST BIOPSY WITH RADIO FREQUENCY LOCALIZER Left 01/27/2023   BREAST BIOPSY WITH RADIO FREQUENCY LOCALIZER;fibroepithelial lesion  Surgeon: Rodolph Romano, MD;  Location: [facility];  Service: General;  Laterality: Left;   BREAST CYST EXCISION Left 10/18/2023   Procedure: Excision of left breast lesion;  Surgeon: Lowery Estefana RAMAN, DO;  Location: [facility];  Service: Plastics;  Laterality: Left;   BREAST REDUCTION SURGERY Bilateral 01/27/2023   Procedure: MAMMARY REDUCTION  (BREAST);  Surgeon: Lowery Estefana RAMAN, DO;  Location: [facility];  Service: Plastics;  Laterality: Bilateral;   LIPOSUCTION WITH LIPOFILLING Left 10/18/2023   Procedure: fat necrosis with fat grafting;  Surgeon: Lowery Estefana RAMAN, DO;   Location: [facility];  Service: Plastics;  Laterality: Left;   WISDOM TOOTH EXTRACTION      Family History  Problem Relation Age of Onset   Hyperlipidemia Mother    Hypertension Father    Cancer Maternal Grandmother        breast   Breast cancer Maternal Grandmother 81   Hyperlipidemia Maternal Grandfather    Heart disease Maternal Grandfather    Hypertension Maternal Grandfather    Stroke Paternal Grandmother    Breast cancer Paternal Aunt     Social History   Socioeconomic History   Marital status: Married    Spouse name: Not on file   Number of children: Not on file   Years of education: Not on file   Highest education level: Bachelor's degree (e.g., BA, AB, BS)  Occupational History   Not on file  Tobacco Use   Smoking status: Never   Smokeless tobacco: Never  Vaping Use   Vaping status: Never Used  Substance and Sexual Activity   Alcohol use: Yes    Comment: rare   Drug use: No   Sexual activity: Not on file  Other Topics Concern   Not on file  Social History Narrative   Lives at home   Social Drivers of Health   Financial Resource Strain: Low Risk  (06/20/2024)   Overall Financial Resource Strain (CARDIA)    Difficulty of Paying Living Expenses: Not hard at all  Food Insecurity: No Food Insecurity (06/20/2024)   Hunger Vital Sign  Worried About Programme researcher, broadcasting/film/video in the Last Year: Never true    Ran Out of Food in the Last Year: Never true  Transportation Needs: No Transportation Needs (06/20/2024)   PRAPARE - Administrator, Civil Service (Medical): No    Lack of Transportation (Non-Medical): No  Physical Activity: Insufficiently Active (06/20/2024)   Exercise Vital Sign    Days of Exercise per Week: 3 days    Minutes of Exercise per Session: 30 min  Stress: Stress Concern Present (06/20/2024)   [employer] of Occupational Health - Occupational Stress Questionnaire    Feeling of Stress: To some extent  Social  Connections: Socially Integrated (06/20/2024)   Social Connection and Isolation Panel    Frequency of Communication with Friends and Family: More than three times a week    Frequency of Social Gatherings with Friends and Family: Once a week    Attends Religious Services: More than 4 times per year    Active Member of [employer] or Organizations: Yes    Attends Engineer, structural: More than 4 times per year    Marital Status: Married  Catering manager Violence: Not on file    Outpatient Medications Prior to Visit  Medication Sig Dispense Refill   albuterol  (VENTOLIN  HFA) 108 (90 Base) MCG/ACT inhaler Inhale 2 puffs into the lungs as needed.     buPROPion  (WELLBUTRIN  XL) 150 MG 24 hr tablet Take 1 tablet (150 mg total) by mouth daily. 90 tablet 1   busPIRone  (BUSPAR ) 5 MG tablet Take 1 tablet (5 mg total) by mouth daily. 90 tablet 1   cetirizine (ZYRTEC) 10 MG tablet Take 1 tablet by mouth at bedtime.     fluticasone  (FLONASE ) 50 MCG/ACT nasal spray Place 2 sprays into both nostrils daily. 16 g 0   levonorgestrel  (MIRENA ) 20 MCG/DAY IUD 1 each by Intrauterine route once.     metFORMIN  (GLUCOPHAGE -XR) 500 MG 24 hr tablet TAKE 1 TABLET BY MOUTH EVERY DAY WITH BREAKFAST 30 tablet 2   mometasone (ELOCON) 0.1 % cream Apply 1 application topically as needed.     montelukast (SINGULAIR) 10 MG tablet Take 1 tablet by mouth at bedtime.     No facility-administered medications prior to visit.      ROS:  Review of Systems BREAST: No symptoms   OBJECTIVE:   Vitals:  There were no vitals taken for this visit.  Physical Exam  Results: No results found for this or any previous visit (from the past 24 hours).   Assessment/Plan: No diagnosis found.    No orders of the defined types were placed in this encounter.     No follow-ups on file.  Skylen Danielsen B. Khanh Cordner, PA-C 08/14/2024 2:47 PM

## 2024-08-15 ENCOUNTER — Ambulatory Visit: Admitting: Obstetrics and Gynecology

## 2024-08-15 ENCOUNTER — Encounter: Payer: Self-pay | Admitting: Obstetrics and Gynecology

## 2024-08-15 ENCOUNTER — Other Ambulatory Visit (HOSPITAL_COMMUNITY)
Admission: RE | Admit: 2024-08-15 | Discharge: 2024-08-15 | Disposition: A | Discharge status: Home / Self Care | Source: Ambulatory Visit | Attending: Obstetrics and Gynecology | Admitting: Obstetrics and Gynecology

## 2024-08-15 VITALS — BP 124/82 | HR 103 | Ht 65.0 in | Wt 188.0 lb

## 2024-08-15 DIAGNOSIS — N921 Excessive and frequent menstruation with irregular cycle: Secondary | ICD-10-CM | POA: Diagnosis not present

## 2024-08-15 DIAGNOSIS — Z1151 Encounter for screening for human papillomavirus (HPV): Secondary | ICD-10-CM | POA: Diagnosis present

## 2024-08-15 DIAGNOSIS — Z30431 Encounter for routine checking of intrauterine contraceptive device: Secondary | ICD-10-CM

## 2024-08-15 DIAGNOSIS — Z124 Encounter for screening for malignant neoplasm of cervix: Secondary | ICD-10-CM | POA: Diagnosis present

## 2024-08-15 NOTE — Patient Instructions (Signed)
 I value your feedback and you entrusting [place] with your care. If you get a [place] patient survey, I would appreciate you taking the time to let us know about your experience today. Thank you! ? ? ?

## 2024-08-16 LAB — CYTOLOGY - PAP
Comment: NEGATIVE
Diagnosis: NEGATIVE
High risk HPV: NEGATIVE

## 2024-10-01 ENCOUNTER — Other Ambulatory Visit: Payer: Self-pay | Admitting: Internal Medicine

## 2024-10-25 ENCOUNTER — Ambulatory Visit: Payer: Self-pay | Admitting: Internal Medicine

## 2024-10-25 ENCOUNTER — Encounter: Payer: Self-pay | Admitting: Internal Medicine

## 2024-10-25 ENCOUNTER — Ambulatory Visit: Admitting: Internal Medicine

## 2024-10-25 VITALS — BP 126/76 | HR 96 | Temp 98.4°F | Ht 65.0 in | Wt 182.4 lb

## 2024-10-25 DIAGNOSIS — Z713 Dietary counseling and surveillance: Secondary | ICD-10-CM | POA: Diagnosis not present

## 2024-10-25 DIAGNOSIS — E7800 Pure hypercholesterolemia, unspecified: Secondary | ICD-10-CM

## 2024-10-25 DIAGNOSIS — Z13220 Encounter for screening for lipoid disorders: Secondary | ICD-10-CM

## 2024-10-25 DIAGNOSIS — F439 Reaction to severe stress, unspecified: Secondary | ICD-10-CM

## 2024-10-25 DIAGNOSIS — T7840XA Allergy, unspecified, initial encounter: Secondary | ICD-10-CM | POA: Insufficient documentation

## 2024-10-25 DIAGNOSIS — R0981 Nasal congestion: Secondary | ICD-10-CM | POA: Diagnosis not present

## 2024-10-25 DIAGNOSIS — Z1231 Encounter for screening mammogram for malignant neoplasm of breast: Secondary | ICD-10-CM

## 2024-10-25 DIAGNOSIS — T7840XD Allergy, unspecified, subsequent encounter: Secondary | ICD-10-CM

## 2024-10-25 DIAGNOSIS — R635 Abnormal weight gain: Secondary | ICD-10-CM

## 2024-10-25 DIAGNOSIS — Z975 Presence of (intrauterine) contraceptive device: Secondary | ICD-10-CM

## 2024-10-25 LAB — COMPREHENSIVE METABOLIC PANEL WITH GFR
ALT: 18 U/L (ref 0–35)
AST: 15 U/L (ref 0–37)
Albumin: 4.5 g/dL (ref 3.5–5.2)
Alkaline Phosphatase: 70 U/L (ref 39–117)
BUN: 11 mg/dL (ref 6–23)
CO2: 26 meq/L (ref 19–32)
Calcium: 9.2 mg/dL (ref 8.4–10.5)
Chloride: 105 meq/L (ref 96–112)
Creatinine, Ser: 0.74 mg/dL (ref 0.40–1.20)
GFR: 98.63 mL/min (ref >60.00)
Glucose, Bld: 91 mg/dL (ref 70–99)
Potassium: 3.7 meq/L (ref 3.5–5.1)
Sodium: 140 meq/L (ref 135–145)
Total Bilirubin: 0.3 mg/dL (ref 0.2–1.2)
Total Protein: 7.3 g/dL (ref 6.0–8.3)

## 2024-10-25 LAB — CBC WITH DIFFERENTIAL/PLATELET
Basophils Absolute: 0 K/uL (ref 0.0–0.1)
Basophils Relative: 0.6 % (ref 0.0–3.0)
Eosinophils Absolute: 0.3 K/uL (ref 0.0–0.7)
Eosinophils Relative: 4.7 % (ref 0.0–5.0)
HCT: 38 % (ref 36.0–46.0)
Hemoglobin: 12.6 g/dL (ref 12.0–15.0)
Lymphocytes Relative: 32 % (ref 12.0–46.0)
Lymphs Abs: 2.4 K/uL (ref 0.7–4.0)
MCHC: 33.3 g/dL (ref 30.0–36.0)
MCV: 89.1 fl (ref 78.0–100.0)
Monocytes Absolute: 0.5 K/uL (ref 0.1–1.0)
Monocytes Relative: 7.1 % (ref 3.0–12.0)
Neutro Abs: 4.1 K/uL (ref 1.4–7.7)
Neutrophils Relative %: 55.6 % (ref 43.0–77.0)
Platelets: 233 K/uL (ref 150.0–400.0)
RBC: 4.27 Mil/uL (ref 3.87–5.11)
RDW: 14.1 % (ref 11.5–15.5)
WBC: 7.4 K/uL (ref 4.0–10.5)

## 2024-10-25 LAB — LIPID PANEL
Cholesterol: 159 mg/dL (ref 0–200)
HDL: 40.7 mg/dL (ref >39.00)
LDL Cholesterol: 83 mg/dL (ref 0–99)
NonHDL: 118.04
Total CHOL/HDL Ratio: 4
Triglycerides: 175 mg/dL — ABNORMAL HIGH (ref 0.0–149.0)
VLDL: 35 mg/dL (ref 0.0–40.0)

## 2024-10-25 MED ORDER — BUPROPION HCL ER (XL) 150 MG PO TB24: 150.0000 mg | ORAL_TABLET | Freq: Every day | ORAL | 1 refills | Status: AC

## 2024-10-25 MED ORDER — BUSPIRONE HCL 5 MG PO TABS: 5.0000 mg | ORAL_TABLET | Freq: Every day | ORAL | 1 refills | Status: AC

## 2024-10-25 NOTE — Assessment & Plan Note (Signed)
 Overall doing well on wellbutrin  and buspar . Stable. No change in medication. Follow.

## 2024-10-25 NOTE — Assessment & Plan Note (Signed)
 Has adjusted diet. Lost weight. Cut out carbs. Follow.

## 2024-10-25 NOTE — Assessment & Plan Note (Signed)
 Continue singulair, zyrtec, flonase . Discussed saline nasal spray. Follow. Call with update.

## 2024-10-25 NOTE — Assessment & Plan Note (Signed)
 Had f/u with gyn 08/15/24 - f/u IUD - 8 yr indication (replaced 07/2019). PAP performed - negative with negative HPV.

## 2024-10-25 NOTE — Assessment & Plan Note (Signed)
 Intermittent flares. Continue singulair, zyrtec, nasonex and albuterol . Saline flushes. Follow.  Call with update.

## 2024-10-25 NOTE — Progress Notes (Signed)
 Subjective:    Patient ID: Tara Davila, female    DOB: 1981/06/01, 43 y.o.   MRN: [id number]  Patient here for  Chief Complaint  Patient presents with   Medical Management of Chronic Issues    HPI Here for a scheduled follow up - follow up regarding increased stress. Is s/p bilateral breast reduction in conjunction with left breast biopsy with radiofrequency localization by Dr. Lowery with Dr. Rodolph on 01/27/2023. Postoperatively she developed a wound to the left breast and underwent excision and primary closure on 04/14/2023. Is now s/p right breast mastopexy with left scar contracture excision and fat grafting from abdomen performed 10/18/2023. Had f/u with Dr Cesar 01/25/24 - stable. Recommended to continue yearly bilateral screening mammogram. Continues on buspar  and wellbutrin  - for increased stress. Overall feels these medications are working well. Saw Dr Frutoso 01/2024 - recommended singulair, zyrtec, nasonex and albuterol . She feels this regimen works well. Does notice some intermittent flares - sinus congestion, watery eyes and stuffy nose. Discussed adding saline nasal spray. No chest congestion. No increased cough. Had f/u with gyn 08/15/24 - f/u IUD - 8 yr indication (replaced 07/2019). PAP performed - negative with negative HPV.  She has cut out bread. Decreased carb intake.    Past Medical History:  Diagnosis Date   Allergy    Anemia    Anxiety    Asthma    well controlled   GERD (gastroesophageal reflux disease)    occ   History of chicken pox    Hx: UTI (urinary tract infection)    Past Surgical History:  Procedure Laterality Date   BREAST BIOPSY Left 12/24/2021   left breast stereo x clip neg   BREAST BIOPSY WITH RADIO FREQUENCY LOCALIZER Left 01/27/2023   BREAST BIOPSY WITH RADIO FREQUENCY LOCALIZER;fibroepithelial lesion  Surgeon: Rodolph Romano, MD;  Location: [facility];  Service: General;  Laterality: Left;   BREAST CYST EXCISION Left  10/18/2023   Procedure: Excision of left breast lesion;  Surgeon: Lowery Estefana RAMAN, DO;  Location: [facility];  Service: Plastics;  Laterality: Left;   BREAST REDUCTION SURGERY Bilateral 01/27/2023   Procedure: MAMMARY REDUCTION  (BREAST);  Surgeon: Lowery Estefana RAMAN, DO;  Location: [facility];  Service: Plastics;  Laterality: Bilateral;   COSMETIC SURGERY  01/27/2023   LIPOSUCTION WITH LIPOFILLING Left 10/18/2023   Procedure: fat necrosis with fat grafting;  Surgeon: Lowery Estefana RAMAN, DO;  Location: [facility];  Service: Plastics;  Laterality: Left;   WISDOM TOOTH EXTRACTION     Family History  Problem Relation Age of Onset   Hyperlipidemia Mother    Hypertension Father    Breast cancer Paternal Aunt        late years   Breast cancer Maternal Grandmother 68   Cancer Maternal Grandmother    Hyperlipidemia Maternal Grandfather    Heart disease Maternal Grandfather    Hypertension Maternal Grandfather    Stroke Paternal Grandmother    Social History   Socioeconomic History   Marital status: Married    Spouse name: Not on file   Number of children: Not on file   Years of education: Not on file   Highest education level: Bachelor's degree (e.g., BA, AB, BS)  Occupational History   Not on file  Tobacco Use   Smoking status: Never   Smokeless tobacco: Never  Vaping Use   Vaping status: Never Used  Substance and Sexual Activity   Alcohol use: Not Currently  Drug use: No   Sexual activity: Yes    Birth control/protection: I.U.D.    Comment: Mirena   Other Topics Concern   Not on file  Social History Narrative   Lives at home   Social Drivers of Health   Financial Resource Strain: Low Risk  (10/24/2024)   Overall Financial Resource Strain (CARDIA)    Difficulty of Paying Living Expenses: Not hard at all  Food Insecurity: No Food Insecurity (10/24/2024)   Hunger Vital Sign    Worried About Running Out of Food in the Last Year: Never true     Ran Out of Food in the Last Year: Never true  Transportation Needs: No Transportation Needs (10/24/2024)   PRAPARE - Administrator, Civil Service (Medical): No    Lack of Transportation (Non-Medical): No  Physical Activity: Insufficiently Active (10/24/2024)   Exercise Vital Sign    Days of Exercise per Week: 3 days    Minutes of Exercise per Session: 30 min  Stress: No Stress Concern Present (10/24/2024)   [employer] of Occupational Health - Occupational Stress Questionnaire    Feeling of Stress: Only a little  Social Connections: Socially Integrated (10/24/2024)   Social Connection and Isolation Panel    Frequency of Communication with Friends and Family: More than three times a week    Frequency of Social Gatherings with Friends and Family: Once a week    Attends Religious Services: More than 4 times per year    Active Member of [employer] or Organizations: Yes    Attends Engineer, Structural: More than 4 times per year    Marital Status: Married     Review of Systems  Constitutional:  Negative for appetite change and unexpected weight change.  HENT:  Positive for congestion. Negative for sore throat.   Respiratory:  Negative for cough, chest tightness and shortness of breath.   Cardiovascular:  Negative for chest pain, palpitations and leg swelling.  Gastrointestinal:  Negative for abdominal pain, diarrhea, nausea and vomiting.  Genitourinary:  Negative for difficulty urinating and dysuria.  Musculoskeletal:  Negative for joint swelling and myalgias.  Skin:  Negative for color change and rash.  Neurological:  Negative for dizziness and headaches.  Psychiatric/Behavioral:  Negative for agitation and dysphoric mood.        Objective:     BP 126/76   Pulse 96   Temp 98.4 F (36.9 C) (Oral)   Ht 5' 5 (1.651 m)   Wt 182 lb 6.4 oz (82.7 kg)   SpO2 99%   BMI 30.35 kg/m  Wt Readings from Last 3 Encounters:  10/25/24 182 lb 6.4 oz (82.7 kg)   08/15/24 188 lb (85.3 kg)  06/21/24 189 lb (85.7 kg)    Physical Exam Vitals reviewed.  Constitutional:      General: She is not in acute distress.    Appearance: Normal appearance.  HENT:     Head: Normocephalic and atraumatic.     Right Ear: External ear normal.     Left Ear: External ear normal.     Mouth/Throat:     Pharynx: No oropharyngeal exudate or posterior oropharyngeal erythema.  Eyes:     General: No scleral icterus.       Right eye: No discharge.        Left eye: No discharge.     Conjunctiva/sclera: Conjunctivae normal.  Neck:     Thyroid : No thyromegaly.  Cardiovascular:     Rate and Rhythm: Normal rate  and regular rhythm.  Pulmonary:     Effort: No respiratory distress.     Breath sounds: Normal breath sounds. No wheezing.  Abdominal:     General: Bowel sounds are normal.     Palpations: Abdomen is soft.     Tenderness: There is no abdominal tenderness.  Musculoskeletal:        General: No swelling or tenderness.     Cervical back: Neck supple. No tenderness.  Lymphadenopathy:     Cervical: No cervical adenopathy.  Skin:    Findings: No erythema or rash.  Neurological:     Mental Status: She is alert.  Psychiatric:        Mood and Affect: Mood normal.        Behavior: Behavior normal.         Outpatient Encounter Medications as of 10/25/2024  Medication Sig   albuterol  (VENTOLIN  HFA) 108 (90 Base) MCG/ACT inhaler Inhale 2 puffs into the lungs as needed.   cetirizine (ZYRTEC) 10 MG tablet Take 1 tablet by mouth at bedtime.   fluticasone  (FLONASE ) 50 MCG/ACT nasal spray Place 2 sprays into both nostrils daily.   levonorgestrel  (MIRENA ) 20 MCG/DAY IUD 1 each by Intrauterine route once.   metFORMIN  (GLUCOPHAGE -XR) 500 MG 24 hr tablet TAKE 1 TABLET BY MOUTH EVERY DAY WITH BREAKFAST   mometasone (ELOCON) 0.1 % cream Apply 1 application topically as needed.   montelukast (SINGULAIR) 10 MG tablet Take 1 tablet by mouth at bedtime.   buPROPion   (WELLBUTRIN  XL) 150 MG 24 hr tablet Take 1 tablet (150 mg total) by mouth daily.   busPIRone  (BUSPAR ) 5 MG tablet Take 1 tablet (5 mg total) by mouth daily.   [DISCONTINUED] buPROPion  (WELLBUTRIN  XL) 150 MG 24 hr tablet Take 1 tablet (150 mg total) by mouth daily.   [DISCONTINUED] busPIRone  (BUSPAR ) 5 MG tablet Take 1 tablet (5 mg total) by mouth daily.   No facility-administered encounter medications on file as of 10/25/2024.     Lab Results  Component Value Date   WBC 7.4 10/25/2024   HGB 12.6 10/25/2024   HCT 38.0 10/25/2024   PLT 233.0 10/25/2024   GLUCOSE 91 10/25/2024   CHOL 159 10/25/2024   TRIG 175.0 (H) 10/25/2024   HDL 40.70 10/25/2024   LDLCALC 83 10/25/2024   ALT 18 10/25/2024   AST 15 10/25/2024   NA 140 10/25/2024   K 3.7 10/25/2024   CL 105 10/25/2024   CREATININE 0.74 10/25/2024   BUN 11 10/25/2024   CO2 26 10/25/2024   TSH 4.11 06/21/2024       Assessment & Plan:  Encounter for screening mammogram for malignant neoplasm of breast -     3D Screening Mammogram, Left and Right; Future  Hypercholesterolemia -     Lipid panel -     CBC with Differential/Platelet -     Comprehensive metabolic panel with GFR  Stress Assessment & Plan: Overall doing well on wellbutrin  and buspar . Stable. No change in medication. Follow.    Nasal congestion Assessment & Plan: Intermittent flares. Continue singulair, zyrtec, nasonex and albuterol . Saline flushes. Follow.  Call with update.    IUD (intrauterine device) in place Assessment & Plan:  Had f/u with gyn 08/15/24 - f/u IUD - 8 yr indication (replaced 07/2019). PAP performed - negative with negative HPV.    Weight loss counseling, encounter for Assessment & Plan: Has adjusted diet. Lost weight. Cut out carbs. Follow.    Allergy, subsequent encounter Assessment & Plan:  Continue singulair, zyrtec, flonase . Discussed saline nasal spray. Follow. Call with update.    Other orders -     buPROPion  HCl ER (XL);  Take 1 tablet (150 mg total) by mouth daily.  Dispense: 90 tablet; Refill: 1 -     busPIRone  HCl; Take 1 tablet (5 mg total) by mouth daily.  Dispense: 90 tablet; Refill: 1     Allena Hamilton, MD

## 2025-02-26 ENCOUNTER — Ambulatory Visit: Admitting: Internal Medicine
